# Patient Record
Sex: Female | Born: 1975 | Race: Black or African American | Hispanic: No | Marital: Married | Smoking: Never smoker
Health system: Southern US, Community
[De-identification: ages and names within clinical notes are randomized; demographics above are authoritative.]

## PROBLEM LIST (undated history)

## (undated) DIAGNOSIS — Z800 Family history of malignant neoplasm of digestive organs: Secondary | ICD-10-CM

## (undated) DIAGNOSIS — M2550 Pain in unspecified joint: Secondary | ICD-10-CM

## (undated) DIAGNOSIS — K760 Fatty (change of) liver, not elsewhere classified: Secondary | ICD-10-CM

## (undated) DIAGNOSIS — Z923 Personal history of irradiation: Secondary | ICD-10-CM

## (undated) DIAGNOSIS — E739 Lactose intolerance, unspecified: Secondary | ICD-10-CM

## (undated) DIAGNOSIS — R002 Palpitations: Secondary | ICD-10-CM

## (undated) DIAGNOSIS — E559 Vitamin D deficiency, unspecified: Secondary | ICD-10-CM

## (undated) DIAGNOSIS — K5900 Constipation, unspecified: Secondary | ICD-10-CM

## (undated) DIAGNOSIS — Z86718 Personal history of other venous thrombosis and embolism: Secondary | ICD-10-CM

## (undated) DIAGNOSIS — D649 Anemia, unspecified: Secondary | ICD-10-CM

## (undated) DIAGNOSIS — Z803 Family history of malignant neoplasm of breast: Secondary | ICD-10-CM

## (undated) DIAGNOSIS — F419 Anxiety disorder, unspecified: Secondary | ICD-10-CM

## (undated) DIAGNOSIS — I10 Essential (primary) hypertension: Secondary | ICD-10-CM

## (undated) DIAGNOSIS — M7989 Other specified soft tissue disorders: Secondary | ICD-10-CM

## (undated) HISTORY — DX: Other specified soft tissue disorders: M79.89

## (undated) HISTORY — DX: Vitamin D deficiency, unspecified: E55.9

## (undated) HISTORY — PX: REDUCTION MAMMAPLASTY: SUR839

## (undated) HISTORY — DX: Palpitations: R00.2

## (undated) HISTORY — DX: Essential (primary) hypertension: I10

## (undated) HISTORY — PX: ABLATION: SHX5711

## (undated) HISTORY — DX: Family history of malignant neoplasm of breast: Z80.3

## (undated) HISTORY — DX: Anxiety disorder, unspecified: F41.9

## (undated) HISTORY — DX: Fatty (change of) liver, not elsewhere classified: K76.0

## (undated) HISTORY — DX: Family history of malignant neoplasm of digestive organs: Z80.0

## (undated) HISTORY — DX: Personal history of other venous thrombosis and embolism: Z86.718

## (undated) HISTORY — DX: Pain in unspecified joint: M25.50

## (undated) HISTORY — DX: Lactose intolerance, unspecified: E73.9

## (undated) HISTORY — DX: Constipation, unspecified: K59.00

## (undated) HISTORY — DX: Anemia, unspecified: D64.9

---

## 1998-10-23 HISTORY — PX: BREAST LUMPECTOMY: SHX2

## 2021-03-11 ENCOUNTER — Other Ambulatory Visit: Payer: Self-pay | Admitting: Internal Medicine

## 2021-03-11 DIAGNOSIS — Z1231 Encounter for screening mammogram for malignant neoplasm of breast: Secondary | ICD-10-CM

## 2021-04-11 ENCOUNTER — Ambulatory Visit
Admission: RE | Admit: 2021-04-11 | Discharge: 2021-04-11 | Disposition: A | Discharge status: Home / Self Care | Payer: BC Managed Care – PPO | Source: Ambulatory Visit | Attending: Internal Medicine | Admitting: Internal Medicine

## 2021-04-11 DIAGNOSIS — Z1231 Encounter for screening mammogram for malignant neoplasm of breast: Secondary | ICD-10-CM

## 2021-04-16 ENCOUNTER — Other Ambulatory Visit: Payer: Self-pay | Admitting: Internal Medicine

## 2021-04-16 DIAGNOSIS — R928 Other abnormal and inconclusive findings on diagnostic imaging of breast: Secondary | ICD-10-CM

## 2021-04-27 ENCOUNTER — Other Ambulatory Visit: Payer: Self-pay

## 2021-04-27 ENCOUNTER — Ambulatory Visit
Admission: RE | Admit: 2021-04-27 | Discharge: 2021-04-27 | Disposition: A | Discharge status: Home / Self Care | Payer: BC Managed Care – PPO | Source: Ambulatory Visit | Attending: Internal Medicine | Admitting: Internal Medicine

## 2021-04-27 ENCOUNTER — Other Ambulatory Visit: Payer: Self-pay | Admitting: Internal Medicine

## 2021-04-27 DIAGNOSIS — R921 Mammographic calcification found on diagnostic imaging of breast: Secondary | ICD-10-CM

## 2021-04-27 DIAGNOSIS — R928 Other abnormal and inconclusive findings on diagnostic imaging of breast: Secondary | ICD-10-CM

## 2021-05-08 ENCOUNTER — Ambulatory Visit
Admission: RE | Admit: 2021-05-08 | Discharge: 2021-05-08 | Disposition: A | Discharge status: Home / Self Care | Payer: BC Managed Care – PPO | Source: Ambulatory Visit | Attending: Internal Medicine | Admitting: Internal Medicine

## 2021-05-08 ENCOUNTER — Other Ambulatory Visit: Payer: Self-pay | Admitting: Internal Medicine

## 2021-05-08 DIAGNOSIS — R921 Mammographic calcification found on diagnostic imaging of breast: Secondary | ICD-10-CM

## 2021-05-08 DIAGNOSIS — C50919 Malignant neoplasm of unspecified site of unspecified female breast: Secondary | ICD-10-CM

## 2021-05-08 HISTORY — PX: BREAST BIOPSY: SHX20

## 2021-05-08 HISTORY — DX: Malignant neoplasm of unspecified site of unspecified female breast: C50.919

## 2021-05-14 ENCOUNTER — Encounter: Payer: Self-pay | Admitting: General Surgery

## 2021-05-15 ENCOUNTER — Telehealth: Payer: Self-pay | Admitting: Genetic Counselor

## 2021-05-15 NOTE — Telephone Encounter (Signed)
Scheduled appt per 2/21 referral. Pt is aware of appt date and time. Pt is aware to arrive 15 mins prior to appt time and to bring and updated insurance card. Pt is aware of appt location.   ?

## 2021-05-16 ENCOUNTER — Ambulatory Visit: Payer: BC Managed Care – PPO | Attending: General Surgery | Admitting: Physical Therapy

## 2021-05-16 ENCOUNTER — Encounter: Payer: Self-pay | Admitting: Physical Therapy

## 2021-05-16 ENCOUNTER — Other Ambulatory Visit: Payer: Self-pay

## 2021-05-16 DIAGNOSIS — D0512 Intraductal carcinoma in situ of left breast: Secondary | ICD-10-CM | POA: Diagnosis not present

## 2021-05-16 DIAGNOSIS — R293 Abnormal posture: Secondary | ICD-10-CM | POA: Insufficient documentation

## 2021-05-16 NOTE — Therapy (Addendum)
OUTPATIENT PHYSICAL THERAPY BREAST CANCER BASELINE EVALUATION   Patient Name: Joan Dalton MRN: [id number] DOB:[birth year], 46 y.o., female Today's Date: 05/16/2021   PT End of Session - 05/16/21 1410     Visit Number 1    Number of Visits 2    Date for PT Re-Evaluation 06/27/21    PT Start Time 5701    PT Stop Time 1447    PT Time Calculation (min) 42 min    Activity Tolerance Patient tolerated treatment well    Behavior During Therapy WFL for tasks assessed/performed              PCP: Mckinley Jewel, MD  REFERRING PROVIDER: Jovita Kussmaul, MD  REFERRING DIAG: D05.12 (ICD-10-CM) - Intraductal carcinoma in situ of left breast   THERAPY DIAG:  Abnormal posture  Intraductal carcinoma in situ of left breast  ONSET DATE: 05/14/21  SUBJECTIVE                                                                                                                                                                                           SUBJECTIVE STATEMENT: Patient reports she is here today to be seen for her newly diagnosed left breast cancer.   PERTINENT HISTORY:  Patient was diagnosed on 05/14/21 with L breast cancer. It measures 2.8 cm and is located in the upper outer quadrant. 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 Previous hx of  lumpectomy in 2000 on the R side for a nodule (not cancerous), hx of blood clot in 2021  PATIENT GOALS   reduce lymphedema risk and learn post op HEP.   PAIN:  Are you having pain? Yes NPRS scale: 2/10 Pain location: L latissimus muscle Pain orientation: Left  PAIN TYPE: pulsating, burning Pain description: constant  Aggravating factors: worse at night Relieving factors: moving around  PRECAUTIONS: Active CA   HAND DOMINANCE: right  WEIGHT BEARING RESTRICTIONS No  FALLS:  Has patient fallen in last 6 months? No  LIVING ENVIRONMENT: Patient lives with: husband and 5 children (19.17.twins 84, 53) Lives in: House/apartment Has following  equipment at home: None  OCCUPATION: part time- grad Naval architect, full time Statistician in speech language pathology  LEISURE: pt reports she has stopped exercising recently  PRIOR LEVEL OF FUNCTION: Independent   OBJECTIVE  COGNITION:  Overall cognitive status: Within functional limits for tasks assessed    POSTURE:  Forward head and rounded shoulders posture  UPPER EXTREMITY AROM/PROM:  A/PROM RIGHT  05/16/2021   Shoulder extension 76  Shoulder flexion 153  Shoulder abduction 162  Shoulder internal rotation 63  Shoulder external rotation 75    (  Blank rows = not tested)  A/PROM LEFT  05/16/2021  Shoulder extension 66  Shoulder flexion 153  Shoulder abduction 156  Shoulder internal rotation 63  Shoulder external rotation 74    (Blank rows = not tested    UPPER EXTREMITY STRENGTH: 5/5   LYMPHEDEMA ASSESSMENTS:   LANDMARK RIGHT  05/16/2021  10 cm proximal to olecranon process 38.6  Olecranon process 31  10 cm proximal to ulnar styloid process 27  Just proximal to ulnar styloid process 19.7  Across hand at thumb web space 22  At base of 2nd digit 8  (Blank rows = not tested)  LANDMARK LEFT  05/16/2021  10 cm proximal to olecranon process 38  Olecranon process 31  10 cm proximal to ulnar styloid process 26.9  Just proximal to ulnar styloid process 20.7  Across hand at thumb web space 21.5  At base of 2nd digit 7.5  (Blank rows = not tested)   L-DEX LYMPHEDEMA SCREENING:  The patient was assessed using the L-Dex machine today to produce a lymphedema index baseline score. The patient will be reassessed on a regular basis (typically every 3 months) to obtain new L-Dex scores. If the score is > 6.5 points away from his/her baseline score indicating onset of subclinical lymphedema, it will be recommended to wear a compression garment for 4 weeks, 12 hours per day and then be reassessed. If the score continues to be > 6.5 points from baseline at  reassessment, we will initiate lymphedema treatment. Assessing in this manner has a 95% rate of preventing clinically significant lymphedema.   L-DEX FLOWSHEETS - 05/16/21 1400       L-DEX LYMPHEDEMA SCREENING   Measurement Type Unilateral    L-DEX MEASUREMENT EXTREMITY Upper Extremity   upper   POSITION  Standing    DOMINANT SIDE Right    At Risk Side Left    BASELINE SCORE (UNILATERAL) 3.6              PATIENT EDUCATION:  Education details: Lymphedema risk reduction and post op shoulder/posture HEP Person educated: Patient Education method: Explanation, Demonstration, Handout Education comprehension: Patient verbalized understanding and returned demonstration   HOME EXERCISE PROGRAM: Patient was instructed today in a home exercise program today for post op shoulder range of motion. These included active assist shoulder flexion in sitting, scapular retraction, wall walking with shoulder abduction, and hands behind head external rotation.  She was encouraged to do these twice a day, holding 3 seconds and repeating 5 times when permitted by her physician.   ASSESSMENT:  CLINICAL IMPRESSION: Pt reports to PT with recently diagnosed L breast cancer. She will be undergoing a left lumpectomy, SLNB and radiation pending testing. Her shoulder ROM is WFL. She does have a 3-4 month history of L posterior shoulder pain that is worse at night. Her doctor was going to follow up on this to see if it is related to her cancer diagnosis. She may benefit from therapy for this when she returns post surgery. She will benefit from a post op PT reassessment to determine needs and from L-Dex screens every 3 months for 2 years to detect subclinical lymphedema.  Pt will benefit from skilled therapeutic intervention to improve on the following deficits: Decreased knowledge of precautions, impaired UE functional use, pain, decreased ROM, postural dysfunction.   PT treatment/interventions: ADL/self-care  home management, pt/family education, therapeutic exercise  REHAB POTENTIAL: Good  CLINICAL DECISION MAKING: Stable/uncomplicated  EVALUATION COMPLEXITY: Low   GOALS: Goals reviewed with  patient? YES  LONG TERM GOALS: (STG=LTG)   Name Target Date Goal status  1 Pt will be able to verbalize understanding of pertinent lymphedema risk reduction practices relevant to her dx specifically related to skin care.  Baseline:  No knowledge 05/16/2021 Achieved at eval  2 Pt will be able to return demo and/or verbalize understanding of the post op HEP related to regaining shoulder ROM. Baseline:  No knowledge 05/16/2021 Achieved at eval  3 Pt will be able to verbalize understanding of the importance of attending the post op After Breast CA Class for further lymphedema risk reduction education and therapeutic exercise.  Baseline:  No knowledge 05/16/2021 Achieved at eval  4 Pt will demo she has regained full shoulder ROM and function post operatively compared to baselines.  Baseline: See objective measurements taken today. 06/27/21 Inital     PLAN: PT FREQUENCY/DURATION: EVAL and 1 follow up appointment.   PLAN FOR NEXT SESSION: will reassess 3-4 weeks post op to determine needs.   Patient will follow up at outpatient cancer rehab 3-4 weeks following surgery.  If the patient requires physical therapy at that time, a specific plan will be dictated and sent to the referring physician for approval. The patient was educated today on appropriate basic range of motion exercises to begin post operatively and the importance of attending the After Breast Cancer class following surgery.  Patient was educated today on lymphedema risk reduction practices as it pertains to recommendations that will benefit the patient immediately following surgery.  She verbalized good understanding.    Physical Therapy Information for After Breast Cancer Surgery/Treatment:  Lymphedema is a swelling condition that you may be at  risk for in your arm if you have lymph nodes removed from the armpit area.  After a sentinel node biopsy, the risk is approximately 5-9% and is higher after an axillary node dissection.  There is treatment available for this condition and it is not life-threatening.  Contact your physician or physical therapist with concerns. You may begin the 4 shoulder/posture exercises (see additional sheet) when permitted by your physician (typically a week after surgery).  If you have drains, you may need to wait until those are removed before beginning range of motion exercises.  A general recommendation is to not lift your arms above shoulder height until drains are removed.  These exercises should be done to your tolerance and gently.  This is not a "no pain/no gain" type of recovery so listen to your body and stretch into the range of motion that you can tolerate, stopping if you have pain.  If you are having immediate reconstruction, ask your plastic surgeon about doing exercises as he or she may want you to wait. We encourage you to attend the free one time ABC (After Breast Cancer) class offered by [facility].  You will learn information related to lymphedema risk, prevention and treatment and additional exercises to regain mobility following surgery.  You can call [phone] for more information.  This is offered the 1st and 3rd Monday of each month.  You only attend the class one time. While undergoing any medical procedure or treatment, try to avoid blood pressure being taken or needle sticks from occurring on the arm on the side of cancer.   This recommendation begins after surgery and continues for the rest of your life.  This may help reduce your risk of getting lymphedema (swelling in your arm). An excellent resource for those seeking information on  lymphedema is the National Lymphedema Network's web site. It can be accessed at [place].org If you notice swelling in your hand,  arm or breast at any time following surgery (even if it is many years from now), please contact your doctor or physical therapist to discuss this.  Lymphedema can be treated at any time but it is easier for you if it is treated early on.  If you feel like your shoulder motion is not returning to normal in a reasonable amount of time, please contact your surgeon or physical therapist.  [facility] [phone]. [address], [place] [address]  ABC CLASS After Breast Cancer Class  After Breast Cancer Class is a specially designed exercise class to assist you in a safe recover after having breast cancer surgery.  In this class you will learn how to get back to full function whether your drains were just removed or if you had surgery a month ago.  This one-time class is held the 1st and 3rd Monday of every month from 11:00 a.m. until 12:00 noon virtually.  This class is FREE and space is limited. For more information or to register for the next available class, call [phone].  Class Goals  Understand specific stretches to improve the flexibility of you chest and shoulder. Learn ways to safely strengthen your upper body and improve your posture. Understand the warning signs of infection and why you may be at risk for an arm infection. Learn about Lymphedema and prevention.  ** You do not attend this class until after surgery.  Drains must be removed to participate  Patient was instructed today in a home exercise program today for post op shoulder range of motion. These included active assist shoulder flexion in sitting, scapular retraction, wall walking with shoulder abduction, and hands behind head external rotation.  She was encouraged to do these twice a day, holding 3 seconds and repeating 5 times when permitted by her physician.    Allyson Sabal [place], PT 05/16/2021, 2:53 PM   PHYSICAL THERAPY DISCHARGE SUMMARY  Visits from Start  of Care: 1  Current functional level related to goals / functional outcomes: See above   Remaining deficits: See above - pt did not return for post op follow up   Education / Equipment: See above   Patient agrees to discharge. Patient goals were not met. Patient is being discharged due to not returning since the last visit.  Allyson Sabal [place], [place] 02/18/22 9:55 AM

## 2021-05-17 ENCOUNTER — Telehealth: Payer: Self-pay | Admitting: Hematology and Oncology

## 2021-05-17 NOTE — Telephone Encounter (Signed)
Scheduled appt per 2/23 staff msg from nurse navigator. Pt is aware of appt date and time. Pt is aware to arrive 15 mins prior to appt time and to bring and updated insurance card. Pt is aware of appt location.

## 2021-05-20 DIAGNOSIS — D0512 Intraductal carcinoma in situ of left breast: Secondary | ICD-10-CM | POA: Insufficient documentation

## 2021-05-20 NOTE — Progress Notes (Signed)
Radiation Oncology         [phone] ________________________________  Name: Joan Dalton        MRN: [id number]  Date of Service: 05/21/2021 DOB: 06-12-1975  AO:[id number], [name], MD  Jovita Kussmaul, MD     REFERRING PHYSICIAN: Jovita Kussmaul, MD   DIAGNOSIS: The encounter diagnosis was Ductal carcinoma in situ (DCIS) of left breast.   HISTORY OF PRESENT ILLNESS: Joan Dalton is a 46 y.o. female seen at the request of Dr. Marlou Dalton for a newly diagnosed left DCIS.  The patient originally presented with screening detected calcifications in the left breast.  She has a family history of breast cancer.  Diagnostic imaging by mammogram showed a 2.8 cm span of calcifications in the upper outer quadrant.  She underwent stereotactic biopsy on 05/08/2021.  This revealed high-grade DCIS with focal microinvasion that could not be ruled out, associated fibrocystic change with PASH. Her cancer was ER/PR positive. She is seen to discuss treatment recommendations of her cancer.     PREVIOUS RADIATION THERAPY: No   PAST MEDICAL HISTORY:  Past Medical History:  Diagnosis Date   Breast cancer ([place]) 05/08/2021       PAST SURGICAL HISTORY: Past Surgical History:  Procedure Laterality Date   BREAST BIOPSY Left 05/08/2021     FAMILY HISTORY:  Family History  Problem Relation Age of Onset   Breast cancer Mother        14s & 25s   Prostate cancer Father    Colon cancer Maternal Aunt        6s   Breast cancer Maternal Aunt        63s   Pancreatic cancer Maternal Aunt        70s   Colon cancer Maternal Uncle        50s     SOCIAL HISTORY:  reports that she has never smoked. She has never used smokeless tobacco. She reports that she does not drink alcohol and does not use drugs. The patient is married and lives in [place] and is accompanied by her husband. They relocated from [place] recently due to her husband's job as an Psychologist, sport and exercise at [employer]. 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 They have a middle school aged son and high school aged daughter.    ALLERGIES: Sulfamethoxazole-trimethoprim   MEDICATIONS:  Current Outpatient Medications  Medication Sig Dispense Refill   Cholecalciferol 25 MCG (1000 UT) tablet Take by mouth.     ferrous sulfate 325 (65 FE) MG tablet Take by mouth.     Multiple Vitamin (MULTI-VITAMIN) tablet Take 1 tablet by mouth daily.     No current facility-administered medications for this encounter.     REVIEW OF SYSTEMS: On review of systems, the patient reports that she is doing pretty well overall. The process has been somewhat anxiety provoking due to her mother's history of metastatic breast cancer. No breast specific complaints are noted otherwise.      PHYSICAL EXAM:  Wt Readings from Last 3 Encounters:  05/21/21 268 lb (121.6 kg)   Temp Readings from Last 3 Encounters:  05/21/21 97.8 F (36.6 C)   BP Readings from Last 3 Encounters:  05/21/21 (!) 137/91   Pulse Readings from Last 3 Encounters:  05/21/21 88    In general this is a well appearing  African American female in no acute distress. She's alert and oriented x4 and appropriate throughout the examination.  Cardiopulmonary assessment is negative for acute distress and she exhibits normal effort. Bilateral breast exam is deferred.    ECOG = 1  0 - Asymptomatic (Fully active, able to carry on all predisease activities without restriction)  1 - Symptomatic but completely ambulatory (Restricted in physically strenuous activity but ambulatory and able to carry out work of a light or sedentary nature. For example, light housework, office work)  2 - Symptomatic, <50% in bed during the day (Ambulatory and capable of all self care but unable to carry out any work activities. Up and about more than 50% of waking hours)  3 - Symptomatic, >50% in bed, but not bedbound (Capable of only limited self-care, confined to bed or  chair 50% or more of waking hours)  4 - Bedbound (Completely disabled. Cannot carry on any self-care. Totally confined to bed or chair)  5 - Death   Eustace Pen MM, Creech RH, Tormey DC, et al. [phone]). "Toxicity and response criteria of the [facility]". [place] Oncol. 5 (6): 649-55    LABORATORY DATA:  No results found for: WBC, HGB, HCT, MCV, PLT No results found for: NA, K, CL, CO2 No results found for: ALT, AST, GGT, ALKPHOS, BILITOT    RADIOGRAPHY: MM Digital Diagnostic Unilat L  Result Date: 04/27/2021 CLINICAL DATA:  Screening recall for left breast calcifications. The patient has family history of breast cancer in her mother who is currently being treated for metastatic disease. She has family history in a maternal aunt. EXAM: DIGITAL DIAGNOSTIC UNILATERAL LEFT MAMMOGRAM WITH CAD TECHNIQUE: Left digital diagnostic mammography was performed. Mammographic images were processed with CAD. COMPARISON:  Previous exam(s). ACR Breast Density Category c: The breast tissue is heterogeneously dense, which may obscure small masses. FINDINGS: In the upper outer posterior left breast there is a 2.8 cm group of punctate and amorphous calcifications. IMPRESSION: New 2.8 cm group of calcifications in the upper-outer left breast. RECOMMENDATION: Stereotactic biopsy is recommended for the left breast calcifications. The procedure has been scheduled for 05/08/2021 at 7:30 a.m. I have discussed the findings and recommendations with the patient. If applicable, a reminder letter will be sent to the patient regarding the next appointment. BI-RADS CATEGORY  4: Suspicious. Electronically Signed   By: Ammie Ferrier M.D.   On: 04/27/2021 08:06  MM CLIP PLACEMENT LEFT  Result Date: 05/08/2021 CLINICAL DATA:  Post stereotactic guided biopsy of calcifications in the upper-outer posterior left breast. EXAM: 3D DIAGNOSTIC LEFT MAMMOGRAM POST STEREOTACTIC BIOPSY COMPARISON:  Previous exam(s).  FINDINGS: 3D Mammographic images were obtained following stereotactic guided biopsy of calcifications in the upper-outer posterior left breast. A coil shaped biopsy marking clip is present at the site of the biopsied calcifications in the upper-outer posterior left breast. IMPRESSION: Coil shaped biopsy marking clip at site of biopsied calcifications in the upper-outer posterior left breast. Final Assessment: Post Procedure Mammograms for Marker Placement Electronically Signed   By: Everlean Alstrom M.D.   On: 05/08/2021 08:20  MM LT BREAST BX W LOC DEV 1ST LESION IMAGE BX SPEC STEREO GUIDE  Result Date: 05/08/2021 CLINICAL DATA:  46 year old female with indeterminate 2.8 cm group of calcifications in the upper-outer posterior left breast. EXAM: LEFT BREAST STEREOTACTIC CORE NEEDLE BIOPSY COMPARISON:  Previous exams. FINDINGS: The patient and I discussed the procedure of stereotactic-guided biopsy including benefits and alternatives. We discussed the high likelihood of a successful procedure. We discussed the risks of the procedure including infection, bleeding, tissue injury, clip migration, and inadequate  sampling. Informed written consent was given. The usual time out protocol was performed immediately prior to the procedure. Using sterile technique and 1% Lidocaine as local anesthetic, under stereotactic guidance, a 9 gauge vacuum assisted device was used to perform core needle biopsy of the calcifications in the upper-outer posterior left breast using a superior to inferior approach. Specimen radiograph was performed showing the presence of calcifications. Specimens with calcifications are identified for pathology. Lesion quadrant: Upper outer At the conclusion of the procedure, a coil shaped tissue marker clip was deployed into the biopsy cavity. Follow-up 2-view mammogram was performed and dictated separately. IMPRESSION: Stereotactic-guided biopsy of the calcifications in the upper-outer posterior left  breast. No apparent complications. Electronically Signed   By: Everlean Alstrom M.D.   On: 05/08/2021 08:12      IMPRESSION/PLAN: 1. ER/PR positive high-grade DCIS of the left breast cannot rule out focal microinvasion. Dr. Lisbeth Renshaw discusses the pathology findings and reviews the nature of left breast disease. The consensus from the breast conference includes  breast conservation with lumpectomy with sentinel node biopsy versus mastectomy. The patient is considering all of her options. Dr. Lisbeth Renshaw discusses the rationale for external radiotherapy to the breast  if she undergoes breast conserving surgery to reduce risks of local recurrence followed by antiestrogen therapy. We discussed the risks, benefits, short, and long term effects of radiotherapy, as well as the curative intent, and the patient is interested in proceeding. Dr. Lisbeth Renshaw discusses the delivery and logistics of radiotherapy and anticipates a course of 6 1/2 weeks of radiotherapy to the left breast with DIBH technique. We will see her back a few weeks after surgery (if undergoing breast conservation) to discuss the simulation process and anticipate we starting radiotherapy about 4-6 weeks after surgery.  2. Possible genetic predisposition to malignancy. The patient is meeting with our genetic counseling team on 06/06/21. 3. Contraceptive Counseling. The patient does not have uterine bleeding following her endometrial ablation. She may have blood work with Dr. Chryl Heck to see if she is menopausal or not, but if not, may need pregnancy testing prior to radiotherapy.   In a visit lasting 60 minutes, greater than 50% of the time was spent face to face reviewing her case, as well as in preparation of, discussing, and coordinating the patient's care.  The above documentation reflects my direct findings during this shared patient visit. Please see the separate note by Dr. Lisbeth Renshaw on this date for the remainder of the patient's plan of care.    Carola Rhine, [facility]    **Disclaimer: This note was dictated with voice recognition software. Similar sounding words can inadvertently be transcribed and this note may contain transcription errors which may not have been corrected upon publication of note.**

## 2021-05-20 NOTE — Progress Notes (Signed)
New Breast Cancer Diagnosis: Left Breast  Did patient present with symptoms (if so, please note symptoms) or screening mammography?:Screening Calcifications measuring 2.8 cm in the upper outer quadrant of the left breast.   Location and Extent of disease :left breast. Located in the upper outer quadrant, measured 0.2 cm in greatest dimension. Adenopathy no.  Histology per Pathology Report: grade , DCIS  Receptor Status: ER(positive), PR (positive), Her2-neu (), Ki-(%)    Plastic Surgery plan, if any: Dr. Marla Roe 05/21/2021   Surgeon and surgical plan, if any:  Dr. Marlou Starks 05/14/2021 -I have discussed with her in detail the different options for treatment and at this point she is undecided between lumpectomy and mastectomy. -I will refer her to medical and radiation oncology to discuss treatment again. -I will also refer her to genetics given her family history as well as to plastic surgery in case she chooses mastectomy. -She will let us know once she has made a definitive choice.  -I will also refer her to physical therapy for preoperative lymphedema testing and to evaluate her rhomboid pain.  -She has decided to do Lumpectomy.  Surgery unscheduled at this time.    Medical oncologist, treatment if any:    Family History of Breast/Ovarian/Prostate Cancer: Mother and Maternal Aunt had breast cancer.  Dad had prostate Cancer.   Lymphedema issues, if any: Notes some slight swelling under her arm pit.     Pain issues, if any:  No   SAFETY ISSUES: Prior radiation? No Pacemaker/ICD? No Possible current pregnancy? Ablation, No cycle. Is the patient on methotrexate? No  Current Complaints / other details:

## 2021-05-21 ENCOUNTER — Ambulatory Visit
Admission: RE | Admit: 2021-05-21 | Discharge: 2021-05-21 | Disposition: A | Discharge status: Home / Self Care | Payer: BC Managed Care – PPO | Source: Ambulatory Visit | Attending: Radiation Oncology | Admitting: Radiation Oncology

## 2021-05-21 ENCOUNTER — Encounter: Payer: Self-pay | Admitting: Radiation Oncology

## 2021-05-21 ENCOUNTER — Encounter: Payer: Self-pay | Admitting: Plastic Surgery

## 2021-05-21 ENCOUNTER — Other Ambulatory Visit: Payer: Self-pay

## 2021-05-21 ENCOUNTER — Ambulatory Visit: Payer: BC Managed Care – PPO | Admitting: Plastic Surgery

## 2021-05-21 ENCOUNTER — Telehealth: Payer: Self-pay | Admitting: Genetic Counselor

## 2021-05-21 VITALS — BP 134/89 | HR 73 | Ht 66.0 in | Wt 268.0 lb

## 2021-05-21 DIAGNOSIS — D0512 Intraductal carcinoma in situ of left breast: Secondary | ICD-10-CM

## 2021-05-21 DIAGNOSIS — Z803 Family history of malignant neoplasm of breast: Secondary | ICD-10-CM | POA: Diagnosis not present

## 2021-05-21 DIAGNOSIS — Z170 Estrogen receptor positive status [ER+]: Secondary | ICD-10-CM | POA: Insufficient documentation

## 2021-05-21 DIAGNOSIS — Z800 Family history of malignant neoplasm of digestive organs: Secondary | ICD-10-CM | POA: Diagnosis not present

## 2021-05-21 NOTE — Progress Notes (Signed)
Patient ID: Joan Dalton, female    DOB: Apr 01, 1975, 46 y.o.   MRN: [id number]   Chief Complaint  Patient presents with   Advice Only   Breast Cancer    The patient is a 46 year old female here with her husband for a consultation for breast reconstruction.  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  She is 5 feet 6 inches tall and weighs 268 pounds.  Her preop bra size is between a C and a D.  She does have a strong family history of breast cancer.  If she goes with a partial mastectomy she is prepared to get radiation.  If her genetics comes back positive then she is planning on going with bilateral mastectomies.  She has a history of a pulmonary embolism and morbid obesity.  She is currently studying to get her degree at [employer].  She has mild ptosis which would be improved with a mastopexy.   Review of Systems  Constitutional:  Negative for activity change and appetite change.  Eyes: Negative.   Respiratory: Negative.  Negative for chest tightness and shortness of breath.   Cardiovascular: Negative.  Negative for leg swelling.  Gastrointestinal: Negative.   Endocrine: Negative.   Genitourinary: Negative.   Allergic/Immunologic: Negative.   Neurological: Negative.   Hematological: Negative.    Past Medical History:  Diagnosis Date   Breast cancer ([place]) 05/08/2021    Past Surgical History:  Procedure Laterality Date   BREAST BIOPSY Left 05/08/2021      Current Outpatient Medications:    Cholecalciferol 25 MCG (1000 UT) tablet, Take by mouth., Disp: , Rfl:    ferrous sulfate 325 (65 FE) MG tablet, Take by mouth., Disp: , Rfl:    Multiple Vitamin (MULTI-VITAMIN) tablet, Take 1 tablet by mouth daily., Disp: , Rfl:    Objective:   Vitals:   05/21/21 1047  BP: 134/89  Pulse: 73  SpO2: 97%    Physical Exam Vitals reviewed.  Constitutional:      Appearance: Normal appearance.  HENT:     Head: Normocephalic.   Cardiovascular:     Rate and Rhythm: Normal rate.     Pulses: Normal pulses.  Pulmonary:     Effort: Pulmonary effort is normal. No respiratory distress.  Abdominal:     General: There is no distension.     Palpations: Abdomen is soft.     Tenderness: There is no abdominal tenderness.  Skin:    General: Skin is warm.     Capillary Refill: Capillary refill takes less than 2 seconds.     Coloration: Skin is not jaundiced.     Findings: No bruising or lesion.  Neurological:     Mental Status: She is alert and oriented to person, place, and time.  Psychiatric:        Mood and Affect: Mood normal.        Behavior: Behavior normal.        Thought Content: Thought content normal.        Judgment: Judgment normal.    Assessment & Plan:  Ductal carcinoma in situ (DCIS) of left breast  The options for reconstruction we explained to the patient / family for breast reconstruction.  There are two general categories of reconstruction.  We can reconstruction a breast with implants or use the patient's own tissue.  These were further discussed as listed.  Breast reconstruction is an optional procedure and eligibility depends  on the full spectrum of the health of the patient and any co-morbidities.  More than one surgery is often needed to complete the reconstruction process.  The process can take three to twelve months to complete.  The breasts will not be identical due to many factors such as rib differences, shoulder asymmetry and treatments such as radiation.  The goal is to get the breasts to look normal and symmetrical in clothes.  Scars are a part of surgery and may fade some in time but will always be present under clothes.  Surgery may be an option on the non-cancer breast to achieve more symmetry.  No matter which procedure is chosen there is always the risk of complications and even failure of the body to heal.  This could result in no breast.    The options for reconstruction include:  1.  Placement of a tissue expander with Acellular dermal matrix. When the expander is the desired size surgery is performed to remove the expander and place an implant.  In some cases the implant can be placed without an expander.  2. Autologous reconstruction.  3. Combined procedures (ie. latissismus dorsi flap) can be done with an expander / implant placed under the muscle.   The risks, benefits, scars and recovery time were discussed for each of the above. Risks include bleeding, infection, hematoma, seroma, scarring, pain, wound healing complications, flap loss, fat necrosis, capsular contracture, need for implant removal, donor site complications, bulge, hernia, umbilical necrosis, need for urgent reoperation, and need for dressing changes.   The procedure the patient selected / that was best for the patient, was then discussed in further detail.  Total time: 45 minutes. This includes time spent with the patient during the visit as well as time spent before and after the visit reviewing the chart, documenting the encounter, making phone calls and reviewing studies.   Once the patient gets her genetics back we can talk about reconstruction if it is positive.  We reviewed the options as listed above.  If genetics is negative she would like to go ahead with bilateral mastopexy at the time of the partial mastectomy.  I think that is a good idea and will give her a better result.  I have placed a call to Dr. Marlou Starks to discuss.  I will also plan to talk with her in 2 weeks once she gets the results of the genetics back.  Pictures were obtained of the patient and placed in the chart with the patient's or guardian's permission.    [place], DO

## 2021-05-21 NOTE — Telephone Encounter (Signed)
Received call from patient about moving up genetics appt.  Rescheduled genetics appt for 3/2 at [facility] with Santiago Glad.

## 2021-05-22 ENCOUNTER — Other Ambulatory Visit: Payer: Self-pay | Admitting: Genetic Counselor

## 2021-05-22 ENCOUNTER — Encounter: Payer: Self-pay | Admitting: Genetic Counselor

## 2021-05-22 DIAGNOSIS — D0512 Intraductal carcinoma in situ of left breast: Secondary | ICD-10-CM

## 2021-05-23 ENCOUNTER — Ambulatory Visit: Payer: Self-pay | Admitting: General Surgery

## 2021-05-23 ENCOUNTER — Other Ambulatory Visit: Payer: Self-pay

## 2021-05-23 ENCOUNTER — Inpatient Hospital Stay: Payer: BC Managed Care – PPO | Attending: Genetic Counselor | Admitting: Genetic Counselor

## 2021-05-23 ENCOUNTER — Encounter: Payer: BC Managed Care – PPO | Admitting: Genetic Counselor

## 2021-05-23 ENCOUNTER — Other Ambulatory Visit: Payer: BC Managed Care – PPO

## 2021-05-23 ENCOUNTER — Inpatient Hospital Stay: Payer: BC Managed Care – PPO

## 2021-05-23 DIAGNOSIS — D0512 Intraductal carcinoma in situ of left breast: Secondary | ICD-10-CM

## 2021-05-23 DIAGNOSIS — Z8042 Family history of malignant neoplasm of prostate: Secondary | ICD-10-CM | POA: Insufficient documentation

## 2021-05-23 DIAGNOSIS — Z803 Family history of malignant neoplasm of breast: Secondary | ICD-10-CM | POA: Insufficient documentation

## 2021-05-23 DIAGNOSIS — Z800 Family history of malignant neoplasm of digestive organs: Secondary | ICD-10-CM | POA: Insufficient documentation

## 2021-05-24 ENCOUNTER — Encounter: Payer: Self-pay | Admitting: Genetic Counselor

## 2021-05-24 ENCOUNTER — Other Ambulatory Visit: Payer: Self-pay | Admitting: General Surgery

## 2021-05-24 DIAGNOSIS — Z803 Family history of malignant neoplasm of breast: Secondary | ICD-10-CM | POA: Insufficient documentation

## 2021-05-24 DIAGNOSIS — D0512 Intraductal carcinoma in situ of left breast: Secondary | ICD-10-CM

## 2021-05-24 DIAGNOSIS — Z800 Family history of malignant neoplasm of digestive organs: Secondary | ICD-10-CM | POA: Insufficient documentation

## 2021-05-24 NOTE — Progress Notes (Signed)
REFERRING PROVIDER: Jovita Kussmaul, MD [address] [place],  [place] [address]  PRIMARY PROVIDER:  Mckinley Jewel, MD  PRIMARY REASON FOR VISIT:  1. Family history of breast cancer   2. Family history of colon cancer   3. Family history of pancreatic cancer   4. Ductal carcinoma in situ (DCIS) of left breast      HISTORY OF PRESENT ILLNESS:   Joan Dalton, a 46 y.o. female, was seen for a [facility] cancer genetics consultation at the request of Dr. Marlou Starks due to a personal and family history of breast cancer.  Joan Dalton presents to clinic today to discuss the possibility of a hereditary predisposition to cancer, genetic testing, and to further clarify her future cancer risks, as well as potential cancer risks for family members.   In February 2023, at the age of 80, Joan Dalton was diagnosed with DCIS breast cancer. The treatment plan lumpectomy and radiation.      CANCER HISTORY:  Oncology History   No history exists.     RISK FACTORS:  Menarche was at age 64.  First live birth at age 47.  Ovaries intact: yes.  Hysterectomy: no.  Menopausal status: perimenopausal.  HRT use: 0 years. Colonoscopy: yes; normal. Mammogram within the last year: yes. Number of breast biopsies: 1. Up to date with pelvic exams: yes. Any excessive radiation exposure in the past: no  Past Medical History:  Diagnosis Date   Breast cancer ([place]) 05/08/2021   Family history of breast cancer    Family history of colon cancer    Family history of pancreatic cancer     Past Surgical History:  Procedure Laterality Date   BREAST BIOPSY Left 05/08/2021    Social History   Socioeconomic History   Marital status: Married    Spouse name: Not on file   Number of children: Not on file   Years of education: Not on file   Highest education level: Not on file  Occupational History   Not on file  Tobacco Use   Smoking status: Never   Smokeless tobacco: Never  Substance and Sexual Activity    Alcohol use: Never   Drug use: Never   Sexual activity: Not on file  Other Topics Concern   Not on file  Social History Narrative   Not on file   Social Determinants of Health   Financial Resource Strain: Not on file  Food Insecurity: Not on file  Transportation Needs: Not on file  Physical Activity: Not on file  Stress: Not on file  Social Connections: Not on file     FAMILY HISTORY:  We obtained a detailed, 4-generation family history.  Significant diagnoses are listed below: Family History  Problem Relation Age of Onset   Breast cancer Mother        42s & 40s   Prostate cancer Father    Breast cancer Maternal Aunt 75   Pancreatic cancer Maternal Aunt 75   Breast cancer Maternal Aunt        later 30s-early 51s   Colon cancer Maternal Aunt 42   Colon cancer Maternal Uncle        60s   Colon cancer Maternal Grandfather    Pancreatic cancer Cousin        mother's maternal first cousin   Pancreatic cancer Cousin        mother's maternal first cousin     The patient has five children who are all cancer  free.  She has three sisters and a brother who are all cancer free. She also has a paternal half sister who's health she is unaware of.  Her parents are living.  The patient's father was diagnosed with prostate cancer. He was adopted but they have recently become aware of his biological family.  He has two sisters and a brother - none had cancer.  His biological parents are deceased.  The patient's mother had breast cancer at 5 and again in her 64's. She has six sisters and a brother.  The brother died of lung cancer, one sister had breast and pancreatic cancer at 69, a second sister had breast cancer in her 35's-40's, and a third sister had colon cancer at 21.  One sister, who did not have cancer, had a daughter who had cancer in the muscle/tissue of her leg.  The maternal grandfather had colon cancer and the grandmother died of COPD.  She has two nieces with pancreatic  cancer.  Joan Dalton is unaware of previous family history of genetic testing for hereditary cancer risks. Patient's maternal ancestors are of [place] American descent, and paternal ancestors are of [place] and [place] descent. There is no reported Ashkenazi Jewish ancestry. There is no known consanguinity.  GENETIC COUNSELING ASSESSMENT: Joan Dalton is a 46 y.o. female with a personal and family history of breast cancer which is somewhat suggestive of a hereditary cancer syndrome and predisposition to cancer given the young ages of onset and combination of cancers. We, therefore, discussed and recommended the following at today's visit.   DISCUSSION: We discussed that, in general, most cancer is not inherited in families, but instead is sporadic or familial. Sporadic cancers occur by chance and typically happen at older ages (>50 years) as this type of cancer is caused by genetic changes acquired during an individuals lifetime. Some families have more cancers than would be expected by chance; however, the ages or types of cancer are not consistent with a known genetic mutation or known genetic mutations have been ruled out. This type of familial cancer is thought to be due to a combination of multiple genetic, environmental, hormonal, and lifestyle factors. While this combination of factors likely increases the risk of cancer, the exact source of this risk is not currently identifiable or testable.  We discussed that 5 - 10% of breast cancer is hereditary, with most cases associated with BRCA mutations.  There are other genes that can be associated with hereditary breast cancer syndromes.  These include ATM, CHEK2 and PALB2.  We discussed that testing is beneficial for several reasons including knowing how to follow individuals after completing their treatment, identifying whether potential treatment options such as PARP inhibitors would be beneficial, and understand if other family members could be at  risk for cancer and allow them to undergo genetic testing.   We reviewed the characteristics, features and inheritance patterns of hereditary cancer syndromes. We also discussed genetic testing, including the appropriate family members to test, the process of testing, insurance coverage and turn-around-time for results. We discussed the implications of a negative, positive and/or variant of uncertain significant result. In order to get genetic test results in a timely manner so that Joan Dalton can use these genetic test results for surgical decisions, we recommended Joan Dalton pursue genetic testing for the BRCAPlus. Once complete, we recommend Joan Dalton pursue reflex genetic testing to the CancerNext-Expanded+RNAinsight gene panel.   The CancerNext-Expanded gene panel offered by [facility] and includes sequencing and  rearrangement analysis for the following 77 genes: AIP, ALK, APC*, ATM*, AXIN2, BAP1, BARD1, BLM, BMPR1A, BRCA1*, BRCA2*, BRIP1*, CDC73, CDH1*, CDK4, CDKN1B, CDKN2A, CHEK2*, CTNNA1, DICER1, FANCC, FH, FLCN, GALNT12, KIF1B, LZTR1, MAX, MEN1, MET, MLH1*, MSH2*, MSH3, MSH6*, MUTYH*, NBN, NF1*, NF2, NTHL1, PALB2*, PHOX2B, PMS2*, POT1, PRKAR1A, PTCH1, PTEN*, RAD51C*, RAD51D*, RB1, RECQL, RET, SDHA, SDHAF2, SDHB, SDHC, SDHD, SMAD4, SMARCA4, SMARCB1, SMARCE1, STK11, SUFU, TMEM127, TP53*, TSC1, TSC2, VHL and XRCC2 (sequencing and deletion/duplication); EGFR, EGLN1, HOXB13, KIT, MITF, PDGFRA, POLD1, and POLE (sequencing only); EPCAM and GREM1 (deletion/duplication only). DNA and RNA analyses performed for * genes.   Based on Joan Dalton's personal and family history of cancer, she meets medical criteria for genetic testing. Despite that she meets criteria, she may still have an out of pocket cost. We discussed that if her out of pocket cost for testing is over $100, the laboratory will call and confirm whether she wants to proceed with testing.  If the out of pocket cost of testing is less than $100 she  will be billed by the genetic testing laboratory.   PLAN: After considering the risks, benefits, and limitations, Joan Dalton provided informed consent to pursue genetic testing and the blood sample was sent to [facility] for analysis of the CancerNext-Expanded+RNAinsight panel. Results should be available within approximately 2-3 weeks' time, at which point they will be disclosed by telephone to Joan Dalton, as will any additional recommendations warranted by these results. Joan Dalton will receive a summary of her genetic counseling visit and a copy of her results once available. This information will also be available in Epic.   Lastly, we encouraged Joan Dalton to remain in contact with cancer genetics annually so that we can continuously update the family history and inform her of any changes in cancer genetics and testing that may be of benefit for this family.   Joan Dalton questions were answered to her satisfaction today. Our contact information was provided should additional questions or concerns arise. Thank you for the referral and allowing [place] to share in the care of your patient.   Joan Dalton P. Florene Glen, [place], [facility] Licensed, Insurance risk surveyor [email or web address] phone: [phone]  The patient was seen for a total of 30 minutes in face-to-face genetic counseling.  The patient brought her husband. This patient was discussed with Drs. Magrinat, Lindi Adie and/or Burr Medico who agrees with the above.    _______________________________________________________________________ For Office Staff:  Number of people involved in session: 2 Was an Intern/ student involved with case: no

## 2021-05-27 ENCOUNTER — Other Ambulatory Visit: Payer: Self-pay | Admitting: *Deleted

## 2021-05-27 DIAGNOSIS — D0512 Intraductal carcinoma in situ of left breast: Secondary | ICD-10-CM

## 2021-05-30 ENCOUNTER — Ambulatory Visit: Payer: BC Managed Care – PPO | Admitting: Rehabilitation

## 2021-06-06 ENCOUNTER — Encounter: Payer: Self-pay | Admitting: Genetic Counselor

## 2021-06-06 ENCOUNTER — Other Ambulatory Visit: Payer: Self-pay

## 2021-06-06 ENCOUNTER — Inpatient Hospital Stay: Payer: BC Managed Care – PPO | Admitting: Genetic Counselor

## 2021-06-06 ENCOUNTER — Encounter: Payer: Self-pay | Admitting: *Deleted

## 2021-06-06 ENCOUNTER — Inpatient Hospital Stay: Payer: BC Managed Care – PPO

## 2021-06-06 ENCOUNTER — Ambulatory Visit: Payer: Self-pay | Admitting: Genetic Counselor

## 2021-06-06 ENCOUNTER — Inpatient Hospital Stay (HOSPITAL_BASED_OUTPATIENT_CLINIC_OR_DEPARTMENT_OTHER): Payer: BC Managed Care – PPO | Admitting: Hematology and Oncology

## 2021-06-06 ENCOUNTER — Telehealth: Payer: Self-pay | Admitting: Genetic Counselor

## 2021-06-06 ENCOUNTER — Encounter: Payer: Self-pay | Admitting: Hematology and Oncology

## 2021-06-06 VITALS — BP 140/92 | HR 78 | Temp 97.9°F | Resp 16 | Ht 66.0 in | Wt 261.1 lb

## 2021-06-06 DIAGNOSIS — D0512 Intraductal carcinoma in situ of left breast: Secondary | ICD-10-CM | POA: Diagnosis present

## 2021-06-06 DIAGNOSIS — Z8042 Family history of malignant neoplasm of prostate: Secondary | ICD-10-CM

## 2021-06-06 DIAGNOSIS — Z800 Family history of malignant neoplasm of digestive organs: Secondary | ICD-10-CM

## 2021-06-06 DIAGNOSIS — Z803 Family history of malignant neoplasm of breast: Secondary | ICD-10-CM | POA: Diagnosis not present

## 2021-06-06 NOTE — Progress Notes (Signed)
[name] ?CONSULT NOTE ? ?Patient Care Team: ?Mckinley Jewel, MD as PCP - General (Internal Medicine) ? ?CHIEF COMPLAINTS/PURPOSE OF CONSULTATION:  ?Newly diagnosed breast cancer ? ?HISTORY OF PRESENTING ILLNESS:  ?Joan Dalton 46 y.o. female is here because of recent diagnosis of left breast cancer ? ?I reviewed her records extensively and collaborated the history with the patient. ? ?SUMMARY OF ONCOLOGIC HISTORY: ?Oncology History  ?Ductal carcinoma in situ (DCIS) of left breast  ?05/20/2021 Initial Diagnosis  ? Ductal carcinoma in situ (DCIS) of left breast ?  ?06/05/2021 Genetic Testing  ? Negative genetic testing on the CancerNext-Expanded+RNAinsight panel.  The report date is June 05, 2021. ? ?The CancerNext-Expanded gene panel offered by [facility] and includes sequencing and rearrangement analysis for the following 77 genes: AIP, ALK, APC*, ATM*, AXIN2, BAP1, BARD1, BLM, BMPR1A, BRCA1*, BRCA2*, BRIP1*, CDC73, CDH1*, CDK4, CDKN1B, CDKN2A, CHEK2*, CTNNA1, DICER1, FANCC, FH, FLCN, GALNT12, KIF1B, LZTR1, MAX, MEN1, MET, MLH1*, MSH2*, MSH3, MSH6*, MUTYH*, NBN, NF1*, NF2, NTHL1, PALB2*, PHOX2B, PMS2*, POT1, PRKAR1A, PTCH1, PTEN*, RAD51C*, RAD51D*, RB1, RECQL, RET, SDHA, SDHAF2, SDHB, SDHC, SDHD, SMAD4, SMARCA4, SMARCB1, SMARCE1, STK11, SUFU, TMEM127, TP53*, TSC1, TSC2, VHL and XRCC2 (sequencing and deletion/duplication); EGFR, EGLN1, HOXB13, KIT, MITF, PDGFRA, POLD1, and POLE (sequencing only); EPCAM and GREM1 (deletion/duplication only). DNA and RNA analyses performed for * genes.  ?  ? ? ? ?MEDICAL HISTORY:  ?Past Medical History:  ?Diagnosis Date  ? Breast cancer ([place]) 05/08/2021  ? Family history of breast cancer   ? Family history of colon cancer   ? Family history of pancreatic cancer   ? ? ?SURGICAL HISTORY: ?Past Surgical History:  ?Procedure Laterality Date  ? BREAST BIOPSY Left 05/08/2021  ? ? ?SOCIAL HISTORY: ?Social History  ? ?Socioeconomic History  ? Marital status:  Married  ?  Spouse name: Not on file  ? Number of children: Not on file  ? Years of education: Not on file  ? Highest education level: Not on file  ?Occupational History  ? Not on file  ?Tobacco Use  ? Smoking status: Never  ? Smokeless tobacco: Never  ?Substance and Sexual Activity  ? Alcohol use: Never  ? Drug use: Never  ? Sexual activity: Not on file  ?Other Topics Concern  ? Not on file  ?Social History Narrative  ? Not on file  ? ?Social Determinants of Health  ? ?Financial Resource Strain: Not on file  ?Food Insecurity: Not on file  ?Transportation Needs: Not on file  ?Physical Activity: Not on file  ?Stress: Not on file  ?Social Connections: Not on file  ?Intimate Partner Violence: Not At Risk  ? Fear of Current or Ex-Partner: No  ? Emotionally Abused: No  ? Physically Abused: No  ? Sexually Abused: No  ? ? ?FAMILY HISTORY: ?Family History  ?Problem Relation Age of Onset  ? Breast cancer Mother   ?     39s & 74s  ? Prostate cancer Father   ? Breast cancer Maternal Aunt 75  ? Pancreatic cancer Maternal Aunt 36  ? Breast cancer Maternal Aunt   ?     later 30s-early 56s  ? Colon cancer Maternal Aunt 84  ? Colon cancer Maternal Uncle   ?     77s  ? Colon cancer Maternal Grandfather   ? Pancreatic cancer Cousin   ?     mother's maternal first cousin  ? Pancreatic cancer Cousin   ?     mother's maternal  first cousin  ? ? ?ALLERGIES:  is allergic to sulfamethoxazole-trimethoprim. ? ?MEDICATIONS:  ?Current Outpatient Medications  ?Medication Sig Dispense Refill  ? acetaminophen (TYLENOL) 500 MG tablet Take 1,000 mg by mouth every 6 (six) hours as needed for moderate pain or headache.    ? COD LIVER OIL PO Take 5 mLs by mouth 3 (three) times a week.    ? ferrous sulfate 325 (65 FE) MG tablet Take 325 mg by mouth daily in the afternoon.    ? ibuprofen (ADVIL) 200 MG tablet Take 400 mg by mouth every 6 (six) hours as needed for moderate pain.    ? Multiple Vitamin (MULTI-VITAMIN) tablet Take 1 tablet by mouth daily.     ? VITAMIN D PO Take 1 capsule by mouth daily.    ? ?No current facility-administered medications for this visit.  ? ? ? ?PHYSICAL EXAMINATION: ?ECOG PERFORMANCE STATUS: 0 - Asymptomatic ? ?Vitals:  ? 06/06/21 1405  ?BP: (!) 140/92  ?Pulse: 78  ?Resp: 16  ?Temp: 97.9 ?F (36.6 ?C)  ?SpO2: 99%  ? ?Filed Weights  ? 06/06/21 1405  ?Weight: 261 lb 1.6 oz (118.4 kg)  ? ?PE deferred in lieu of counseling ? ?LABORATORY DATA:  ?I have reviewed the data as listed ?No results found for: WBC, HGB, HCT, MCV, PLT ?No results found for: NA, K, CL, CO2 ? ?RADIOGRAPHIC STUDIES: ?I have personally reviewed the radiological reports and agreed with the findings in the report. ? ?ASSESSMENT AND PLAN:  ? ?This is a very pleasant 46 year old female patient (menopausal status unknown, last.  Several years ago but had endometrial ablation) referred to hematology/oncology for DCIS of the right breast and recommendations regarding the above.  We have discussed the following findings about DCIS. ? ?Pathology review: I discussed with the patient the difference between DCIS and invasive breast cancer. It is considered a precancerous lesion. DCIS is classified as a Stage 0 breast cancer. It is generally detected through mammograms as calcification. We also discussed the importance of ER and PR receptors and their implications to adjuvant treatment options. Prognosis of DCIS dependence on grade and degree of comedo necrosis. It is anticipated that if not treated, 20-30% of DCIS can develop into invasive breast cancer. ? ?Recommendation: ?1. Breast conserving surgery ?2. Followed by adjuvant radiation therapy ?3. Followed by antiestrogen therapy with tamoxifen/aromatase inhibitors based on menopausal status ?5 years ? ?Tamoxifen counseling: We discussed the risks and benefits of tamoxifen. These include but not limited to insomnia, hot flashes, mood changes, vaginal dryness, and weight gain. Although rare, serious side effects including endometrial  cancer, risk of blood clots were also discussed. We strongly believe that the benefits far outweigh the risks. Patient understands these risks and consented to starting treatment. Planned treatment duration is 5 years. ? ?Aromatase inhibitors counseling: We have discussed the mechanism of action of aromatase inhibitors today.  We have discussed adverse effects including but not limited to menopausal symptoms, increased risk of osteoporosis and fractures, cardiovascular events, arthralgias and myalgias.  We do believe that the benefits far outweigh the risks.  Plan treatment duration of 5 years. ? ?Since her menopausal status is unclear, we may have to start with tamoxifen however she also had an episode of unprovoked PE about 2 years ago for which she remained on anticoagulation for almost a year.  We will try to request records from her hematologist in [place].  She will return to clinic in second week of May after completion of radiation  to initiate antiestrogen therapy.  She understands that given her prior episode of PE, her risk of DVT/PE might be slightly higher with tamoxifen use.  I have offered considering ovarian suppression and concurrent aromatase inhibitors although this is not something we use in routine practice.  She is not really keen on doing any ovarian suppression, she is worried that menopause will bring other issues. ?We will also do estradiol and FSH levels today. ?I was going to talk to her about the specimen collection study but.  It appears that she may have left without the labs. ? ?Thank you for consulting [place] in the care of this patient.  Please do not hesitate to contact us with any questions ?All questions were answered. The patient knows to call the clinic with any problems, questions or concerns. ?  ? Benay Pike, MD ?06/06/21 ? ?

## 2021-06-06 NOTE — Progress Notes (Signed)
HPI:  Joan Dalton was previously seen in the [place] clinic due to a personal and family history of cancer and concerns regarding a hereditary predisposition to cancer. Please refer to our prior cancer genetics clinic note for more information regarding our discussion, assessment and recommendations, at the time. Joan Dalton recent genetic test results were disclosed to her, as were recommendations warranted by these results. These results and recommendations are discussed in more detail below. ? ?CANCER HISTORY:  ?Oncology History  ?Ductal carcinoma in situ (DCIS) of left breast  ?05/20/2021 Initial Diagnosis  ? Ductal carcinoma in situ (DCIS) of left breast ?  ?06/05/2021 Genetic Testing  ? Negative genetic testing on the CancerNext-Expanded+RNAinsight panel.  The report date is June 05, 2021. ? ?The CancerNext-Expanded gene panel offered by [facility] and includes sequencing and rearrangement analysis for the following 77 genes: AIP, ALK, APC*, ATM*, AXIN2, BAP1, BARD1, BLM, BMPR1A, BRCA1*, BRCA2*, BRIP1*, CDC73, CDH1*, CDK4, CDKN1B, CDKN2A, CHEK2*, CTNNA1, DICER1, FANCC, FH, FLCN, GALNT12, KIF1B, LZTR1, MAX, MEN1, MET, MLH1*, MSH2*, MSH3, MSH6*, MUTYH*, NBN, NF1*, NF2, NTHL1, PALB2*, PHOX2B, PMS2*, POT1, PRKAR1A, PTCH1, PTEN*, RAD51C*, RAD51D*, RB1, RECQL, RET, SDHA, SDHAF2, SDHB, SDHC, SDHD, SMAD4, SMARCA4, SMARCB1, SMARCE1, STK11, SUFU, TMEM127, TP53*, TSC1, TSC2, VHL and XRCC2 (sequencing and deletion/duplication); EGFR, EGLN1, HOXB13, KIT, MITF, PDGFRA, POLD1, and POLE (sequencing only); EPCAM and GREM1 (deletion/duplication only). DNA and RNA analyses performed for * genes.  ?  ? ? ?FAMILY HISTORY:  ?We obtained a detailed, 4-generation family history.  Significant diagnoses are listed below: ?Family History  ?Problem Relation Age of Onset  ? Breast cancer Mother   ?     41s & 75s  ? Prostate cancer Father   ? Breast cancer Maternal Aunt 75  ? Pancreatic cancer Maternal Aunt 37  ?  Breast cancer Maternal Aunt   ?     later 30s-early 70s  ? Colon cancer Maternal Aunt 80  ? Colon cancer Maternal Uncle   ?     66s  ? Colon cancer Maternal Grandfather   ? Pancreatic cancer Cousin   ?     mother's maternal first cousin  ? Pancreatic cancer Cousin   ?     mother's maternal first cousin  ? ? ?  ?The patient has five children who are all cancer free.  She has three sisters and a brother who are all cancer free. She also has a paternal half sister who's health she is unaware of.  Her parents are living. ?  ?The patient's father was diagnosed with prostate cancer. He was adopted but they have recently become aware of his biological family.  He has two sisters and a brother - none had cancer.  His biological parents are deceased. ?  ?The patient's mother had breast cancer at 77 and again in her 28's. She has six sisters and a brother.  The brother died of lung cancer, one sister had breast and pancreatic cancer at 58, a second sister had breast cancer in her 110's-40's, and a third sister had colon cancer at 81.  One sister, who did not have cancer, had a daughter who had cancer in the muscle/tissue of her leg.  The maternal grandfather had colon cancer and the grandmother died of COPD.  She has two nieces with pancreatic cancer. ?  ?Joan Dalton is unaware of previous family history of genetic testing for hereditary cancer risks. Patient's maternal ancestors are of African American descent, and paternal ancestors are of  African American and [place] descent. There is no reported Ashkenazi Jewish ancestry. There is no known consanguinity. ? ?GENETIC TEST RESULTS: Genetic testing reported out on June 05, 2021 through the CancerNext-Expanded+RNAinsight cancer panel found no pathogenic mutations. The CancerNext-Expanded gene panel offered by [facility] and includes sequencing and rearrangement analysis for the following 77 genes: AIP, ALK, APC*, ATM*, AXIN2, BAP1, BARD1, BLM, BMPR1A, BRCA1*, BRCA2*, BRIP1*,  CDC73, CDH1*, CDK4, CDKN1B, CDKN2A, CHEK2*, CTNNA1, DICER1, FANCC, FH, FLCN, GALNT12, KIF1B, LZTR1, MAX, MEN1, MET, MLH1*, MSH2*, MSH3, MSH6*, MUTYH*, NBN, NF1*, NF2, NTHL1, PALB2*, PHOX2B, PMS2*, POT1, PRKAR1A, PTCH1, PTEN*, RAD51C*, RAD51D*, RB1, RECQL, RET, SDHA, SDHAF2, SDHB, SDHC, SDHD, SMAD4, SMARCA4, SMARCB1, SMARCE1, STK11, SUFU, TMEM127, TP53*, TSC1, TSC2, VHL and XRCC2 (sequencing and deletion/duplication); EGFR, EGLN1, HOXB13, KIT, MITF, PDGFRA, POLD1, and POLE (sequencing only); EPCAM and GREM1 (deletion/duplication only). DNA and RNA analyses performed for * genes. The test report has been scanned into EPIC and is located under the Molecular Pathology section of the Results Review tab.  A portion of the result report is included below for reference.  ? ? ? ?We discussed with Joan Dalton that because current genetic testing is not perfect, it is possible there may be a gene mutation in one of these genes that current testing cannot detect, but that chance is small.  We also discussed, that there could be another gene that has not yet been discovered, or that we have not yet tested, that is responsible for the cancer diagnoses in the family. It is also possible there is a hereditary cause for the cancer in the family that Joan Dalton did not inherit and therefore was not identified in her testing.  Therefore, it is important to remain in touch with cancer genetics in the future so that we can continue to offer Joan Dalton the most up to date genetic testing.  ? ?ADDITIONAL GENETIC TESTING: We discussed with Joan Dalton that her genetic testing was fairly extensive.  If there are genes identified to increase cancer risk that can be analyzed in the future, we would be happy to discuss and coordinate this testing at that time.   ? ?CANCER SCREENING RECOMMENDATIONS: Joan Dalton test result is considered negative (normal).  This means that we have not identified a hereditary cause for her personal and family history  of cancer at this time. Most cancers happen by chance and this negative test suggests that her cancer may fall into this category.   ? ?While reassuring, this does not definitively rule out a hereditary predisposition to cancer. It is still possible that there could be genetic mutations that are undetectable by current technology. There could be genetic mutations in genes that have not been tested or identified to increase cancer risk.  Therefore, it is recommended she continue to follow the cancer management and screening guidelines provided by her oncology and primary healthcare provider.  ? ?An individual's cancer risk and medical management are not determined by genetic test results alone. Overall cancer risk assessment incorporates additional factors, including personal medical history, family history, and any available genetic information that may result in a personalized plan for cancer prevention and surveillance ? ?RECOMMENDATIONS FOR FAMILY MEMBERS:  Individuals in this family might be at some increased risk of developing cancer, over the general population risk, simply due to the family history of cancer.  We recommended women in this family have a yearly mammogram beginning at age 39, or 74 years younger than the earliest onset of cancer, an  annual clinical breast exam, and perform monthly breast self-exams. Women in this family should also have a gynecological exam as recommended by their primary provider. All family members should be referred for colonoscopy starting at age 64. ? ?It is also possible there is a hereditary cause for the cancer in Ms. Calef's family that she did not inherit and therefore was not identified in her.  Based on Joan Dalton's family history, we recommended her mother, who was diagnosed with breast cancer at age 6, or her maternal aunt who had colon cancer diagnosed at 108, have genetic counseling and testing. Joan Dalton will let us know if we can be of any assistance in  coordinating genetic counseling and/or testing for this family member.  ? ?FOLLOW-UP: Lastly, we discussed with Joan Dalton that cancer genetics is a rapidly advancing field and it is possible that new genetic tests

## 2021-06-06 NOTE — Telephone Encounter (Signed)
Revealed negative genetic testing.  Discussed that we do not know why she has DCIS breast cancer or why there is cancer in the family. It could be due to a different gene that we are not testing, or maybe our current technology may not be able to pick something up.  It will be important for her to keep in contact with genetics to keep up with whether additional testing may be needed. ? ? ?

## 2021-06-07 ENCOUNTER — Telehealth: Payer: Self-pay | Admitting: Hematology and Oncology

## 2021-06-07 NOTE — Telephone Encounter (Signed)
Scheduled appointment per 03/16 los. Patient aware.  ? ?

## 2021-06-10 ENCOUNTER — Other Ambulatory Visit: Payer: Self-pay

## 2021-06-10 ENCOUNTER — Encounter: Payer: Self-pay | Admitting: Plastic Surgery

## 2021-06-10 ENCOUNTER — Ambulatory Visit (INDEPENDENT_AMBULATORY_CARE_PROVIDER_SITE_OTHER): Payer: BC Managed Care – PPO | Admitting: Plastic Surgery

## 2021-06-10 DIAGNOSIS — D0512 Intraductal carcinoma in situ of left breast: Secondary | ICD-10-CM | POA: Diagnosis not present

## 2021-06-10 NOTE — Progress Notes (Signed)
? ?  Subjective:  ? ? Patient ID: Joan Dalton, female    DOB: [birth year], 46 y.o.   MRN: [id number] ? ?HPI ?The patient is a 46 year old female joining me by phone.  She was seen February 28 with a diagnosis of breast cancer of the left breast.  It was ductal carcinoma in situ located in the upper outer quadrant.  She is 5 feet 6 inches tall and weighs 268 pounds.  Her preoperative bra size is a C/D cup.  She was waiting on genetics to decide on her surgical treatment plan.  She does have a history of pulmonary embolism.  She is currently studying at [employer].  Fortunately her genetics came back negative.  The patient has decided she would like to undergo bilateral oncoplastic breast reductions/mastopexy at the time of her left partial mastectomy. ? ?Review of Systems ? ?   ?Objective:  ? Physical Exam ? ? ?   ?Assessment & Plan:  ? ?  ICD-10-CM   ?1. Ductal carcinoma in situ (DCIS) of left breast  D05.12   ?  ?  ?I connected with  [name] on 06/10/21 by phone and verified that I am speaking with the correct person using two identifiers.  The patient was at home and I was at the office.  I spent 15 minutes in the following manner: Review of chart, discussion with patient, documentation and request for surgery.  I also notified Dr. Marlou Starks of the patient's decision. ?  ?We will plan on bilateral immediate oncoplastic breast reduction. ? ?I discussed the limitations of evaluation and management by telemedicine. The patient expressed understanding and agreed to proceed. ? ? ?

## 2021-06-11 ENCOUNTER — Telehealth: Payer: BC Managed Care – PPO | Admitting: Plastic Surgery

## 2021-06-11 NOTE — Progress Notes (Signed)
Surgical Instructions ? ? ? Your procedure is scheduled on Monday March 27. ? Report to [facility] Main Entrance "A" at 12:30 P.M., then check in with the Admitting office. ? Call this number if you have problems the morning of surgery: ? [phone] ? ? If you have any questions prior to your surgery date call [phone]: Open Monday-Friday 8am-4pm ? ? ? Remember: ? Do not eat after midnight the night before your surgery ? ?You may drink clear liquids until 11:30AM the morning of your surgery.   ?Clear liquids allowed are: Water, Non-Citrus Juices (without pulp), Carbonated Beverages, Clear Tea, Black Coffee ONLY (NO MILK, CREAM OR POWDERED CREAMER of any kind), and Gatorade ?  ? Take these medicines the morning of surgery with A SIP OF WATER:  ?acetaminophen (TYLENOL) if needed ? ? ?As of today, STOP taking any Aspirin (unless otherwise instructed by your surgeon) Aleve, Naproxen, Ibuprofen, Motrin, Advil, Goody's, BC's, all herbal medications, fish oil, and all vitamins. ? ?         ?Do not wear jewelry or makeup ?Do not wear lotions, powders, perfumes/colognes, or deodorant. ?Do not shave 48 hours prior to surgery.  Men may shave face and neck. ?Do not bring valuables to the hospital. ?Do not wear nail polish, gel polish, artificial nails, or any other type of covering on natural nails (fingers and toes) ?If you have artificial nails or gel coating that need to be removed by a nail salon, please have this removed prior to surgery. Artificial nails or gel coating may interfere with anesthesia's ability to adequately monitor your vital signs. ? ?[facility] is not responsible for any belongings or valuables. .  ? ?Do NOT Smoke (Tobacco/Vaping)  24 hours prior to your procedure ? ?If you use a CPAP at night, you may bring your mask for your overnight stay. ?  ?Contacts, glasses, hearing aids, dentures or partials may not be worn into surgery, please bring cases for these belongings ?  ?For patients admitted to  the hospital, discharge time will be determined by your treatment team. ?  ?Patients discharged the day of surgery will not be allowed to drive home, and someone needs to stay with them for 24 hours. ? ?NO VISITORS WILL BE ALLOWED IN PRE-OP WHERE PATIENTS ARE PREPPED FOR SURGERY.  ONLY 1 SUPPORT PERSON MAY BE PRESENT IN THE WAITING ROOM WHILE YOU ARE IN SURGERY.  IF YOU ARE TO BE ADMITTED, ONCE YOU ARE IN YOUR ROOM YOU WILL BE ALLOWED TWO (2) VISITORS. 1 (ONE) VISITOR MAY STAY OVERNIGHT BUT MUST ARRIVE TO THE ROOM BY 8pm.  Minor children may have two parents present. Special consideration for safety and communication needs will be reviewed on a case by case basis. ? ?Special instructions:   ? ?Oral Hygiene is also important to reduce your risk of infection.  Remember - BRUSH YOUR TEETH THE MORNING OF SURGERY WITH YOUR REGULAR TOOTHPASTE ? ? ?[facility]- Preparing For Surgery ? ?Before surgery, you can play an important role. Because skin is not sterile, your skin needs to be as free of germs as possible. You can reduce the number of germs on your skin by washing with CHG (chlorahexidine gluconate) Soap before surgery.  CHG is an antiseptic cleaner which kills germs and bonds with the skin to continue killing germs even after washing.   ? ? ?Please do not use if you have an allergy to CHG or antibacterial soaps. If your skin becomes reddened/irritated stop using the  CHG.  ?Do not shave (including legs and underarms) for at least 48 hours prior to first CHG shower. It is OK to shave your face. ? ?Please follow these instructions carefully. ?  ? ? Shower the NIGHT BEFORE SURGERY and the MORNING OF SURGERY with CHG Soap.  ? If you chose to wash your hair, wash your hair first as usual with your normal shampoo. After you shampoo, rinse your hair and body thoroughly to remove the shampoo.  Then [employer] and genitals (private parts) with your normal soap and rinse thoroughly to remove soap. ? ?After that Use CHG Soap as  you would any other liquid soap. You can apply CHG directly to the skin and wash gently with a scrungie or a clean washcloth.  ? ?Apply the CHG Soap to your body ONLY FROM THE NECK DOWN.  Do not use on open wounds or open sores. Avoid contact with your eyes, ears, mouth and genitals (private parts). Wash Face and genitals (private parts)  with your normal soap.  ? ?Wash thoroughly, paying special attention to the area where your surgery will be performed. ? ?Thoroughly rinse your body with warm water from the neck down. ? ?DO NOT shower/wash with your normal soap after using and rinsing off the CHG Soap. ? ?Pat yourself dry with a CLEAN TOWEL. ? ?Wear CLEAN PAJAMAS to bed the night before surgery ? ?Place CLEAN SHEETS on your bed the night before your surgery ? ?DO NOT SLEEP WITH PETS. ? ? ?Day of Surgery: ? ?Take a shower with CHG soap. ?Wear Clean/Comfortable clothing the morning of surgery ?Do not apply any deodorants/lotions.   ?Remember to brush your teeth WITH YOUR REGULAR TOOTHPASTE. ? ? ?Notify your provider: ?if you are in close contact with someone who has COVID  ?or if you develop a fever of 100.4 or greater, sneezing, cough, sore throat, shortness of breath or body aches. ? ?  ?Please read over the following fact sheets that you were given.  ? ?

## 2021-06-12 ENCOUNTER — Encounter (HOSPITAL_COMMUNITY)
Admission: RE | Admit: 2021-06-12 | Discharge: 2021-06-12 | Disposition: A | Discharge status: Home / Self Care | Payer: BC Managed Care – PPO | Source: Ambulatory Visit | Attending: General Surgery | Admitting: General Surgery

## 2021-06-12 ENCOUNTER — Other Ambulatory Visit: Payer: Self-pay

## 2021-06-12 ENCOUNTER — Encounter (HOSPITAL_COMMUNITY): Payer: Self-pay

## 2021-06-12 VITALS — BP 133/88 | HR 66 | Temp 97.9°F | Resp 17 | Ht 66.0 in | Wt 257.7 lb

## 2021-06-12 DIAGNOSIS — D0512 Intraductal carcinoma in situ of left breast: Secondary | ICD-10-CM | POA: Diagnosis not present

## 2021-06-12 DIAGNOSIS — Z01812 Encounter for preprocedural laboratory examination: Secondary | ICD-10-CM | POA: Diagnosis present

## 2021-06-12 LAB — CBC
HCT: 39.2 % (ref 36.0–46.0)
Hemoglobin: 13.1 g/dL (ref 12.0–15.0)
MCH: 31 pg (ref 26.0–34.0)
MCHC: 33.4 g/dL (ref 30.0–36.0)
MCV: 92.9 fL (ref 80.0–100.0)
Platelets: 251 10*3/uL (ref 150–400)
RBC: 4.22 MIL/uL (ref 3.87–5.11)
RDW: 12.4 % (ref 11.5–15.5)
WBC: 4 10*3/uL (ref 4.0–10.5)
nRBC: 0 % (ref 0.0–0.2)

## 2021-06-12 LAB — BASIC METABOLIC PANEL
Anion gap: 10 (ref 5–15)
BUN: <5 mg/dL — ABNORMAL LOW (ref 6–20)
CO2: 26 mmol/L (ref 22–32)
Calcium: 9.5 mg/dL (ref 8.9–10.3)
Chloride: 104 mmol/L (ref 98–111)
Creatinine, Ser: 0.76 mg/dL (ref 0.44–1.00)
GFR, Estimated: >60 mL/min (ref >60)
Glucose, Bld: 99 mg/dL (ref 70–99)
Potassium: 4 mmol/L (ref 3.5–5.1)
Sodium: 140 mmol/L (ref 135–145)

## 2021-06-12 NOTE — Progress Notes (Addendum)
PCP - Dr. Early Osmond, MD ?Cardiologist - Denies ? ?Chest x-ray - Not indicated ?EKG - Not indicated ?Stress Test - Denies ?ECHO - Denies ?Cardiac Cath - Denies ? ?Sleep Study - Yes no OSA ? ?DM - Denies ? ?ERAS Protcol - Yes updated time for clears until 8:30am DOS ? ?Anesthesia review: No ? ?Patient denies shortness of breath, fever, cough and chest pain at PAT appointment ? ? ?All instructions explained to the patient, with a verbal understanding of the material. Patient agrees to go over the instructions while at home for a better understanding. The opportunity to ask questions was provided. ? ? ?

## 2021-06-12 NOTE — Progress Notes (Signed)
Patient aware of new time for surgery from 12:30-14:20 with a 10:30 am arrival.  Also made aware of new visitation guidelines.  ?

## 2021-06-13 ENCOUNTER — Ambulatory Visit
Admission: RE | Admit: 2021-06-13 | Discharge: 2021-06-13 | Disposition: A | Discharge status: Home / Self Care | Payer: BC Managed Care – PPO | Source: Ambulatory Visit | Attending: General Surgery | Admitting: General Surgery

## 2021-06-13 DIAGNOSIS — D0512 Intraductal carcinoma in situ of left breast: Secondary | ICD-10-CM

## 2021-06-17 ENCOUNTER — Ambulatory Visit
Admission: RE | Admit: 2021-06-17 | Discharge: 2021-06-17 | Disposition: A | Discharge status: Home / Self Care | Payer: BC Managed Care – PPO | Source: Ambulatory Visit | Attending: General Surgery | Admitting: General Surgery

## 2021-06-17 ENCOUNTER — Other Ambulatory Visit: Payer: Self-pay

## 2021-06-17 ENCOUNTER — Encounter (HOSPITAL_COMMUNITY): Payer: Self-pay | Admitting: General Surgery

## 2021-06-17 ENCOUNTER — Ambulatory Visit (HOSPITAL_COMMUNITY): Payer: BC Managed Care – PPO | Admitting: Certified Registered"

## 2021-06-17 ENCOUNTER — Encounter (HOSPITAL_COMMUNITY)
Admission: RE | Disposition: A | Discharge status: Home / Self Care | Payer: Self-pay | Source: Home / Self Care | Attending: General Surgery

## 2021-06-17 ENCOUNTER — Encounter: Payer: Self-pay | Admitting: *Deleted

## 2021-06-17 ENCOUNTER — Ambulatory Visit (HOSPITAL_COMMUNITY)
Admission: RE | Admit: 2021-06-17 | Discharge: 2021-06-17 | Disposition: A | Discharge status: Home / Self Care | Payer: BC Managed Care – PPO | Attending: General Surgery | Admitting: General Surgery

## 2021-06-17 DIAGNOSIS — D0512 Intraductal carcinoma in situ of left breast: Secondary | ICD-10-CM

## 2021-06-17 DIAGNOSIS — Z6841 Body Mass Index (BMI) 40.0 and over, adult: Secondary | ICD-10-CM | POA: Insufficient documentation

## 2021-06-17 DIAGNOSIS — Z803 Family history of malignant neoplasm of breast: Secondary | ICD-10-CM | POA: Diagnosis not present

## 2021-06-17 HISTORY — PX: AXILLARY SENTINEL NODE BIOPSY: SHX5738

## 2021-06-17 HISTORY — PX: BREAST LUMPECTOMY WITH RADIOACTIVE SEED AND SENTINEL LYMPH NODE BIOPSY: SHX6550

## 2021-06-17 SURGERY — BREAST LUMPECTOMY WITH RADIOACTIVE SEED AND SENTINEL LYMPH NODE BIOPSY
Anesthesia: General | Site: Breast | Laterality: Left

## 2021-06-17 MED ORDER — SUCCINYLCHOLINE CHLORIDE 200 MG/10ML IV SOSY
PREFILLED_SYRINGE | INTRAVENOUS | Status: AC
  Filled 2021-06-17: qty 10

## 2021-06-17 MED ORDER — DEXAMETHASONE SODIUM PHOSPHATE 10 MG/ML IJ SOLN
INTRAMUSCULAR | Status: DC | PRN
  Administered 2021-06-17: 10 mg via INTRAVENOUS

## 2021-06-17 MED ORDER — ONDANSETRON HCL 4 MG/2ML IJ SOLN
INTRAMUSCULAR | Status: DC | PRN
  Administered 2021-06-17: 4 mg via INTRAVENOUS

## 2021-06-17 MED ORDER — CHLORHEXIDINE GLUCONATE CLOTH 2 % EX PADS: 6.0000 | MEDICATED_PAD | Freq: Once | CUTANEOUS | Status: DC

## 2021-06-17 MED ORDER — CHLORHEXIDINE GLUCONATE 0.12 % MT SOLN
15.0000 mL | Freq: Once | OROMUCOSAL | Status: AC
  Administered 2021-06-17: 15 mL via OROMUCOSAL
  Filled 2021-06-17: qty 15

## 2021-06-17 MED ORDER — PHENYLEPHRINE 40 MCG/ML (10ML) SYRINGE FOR IV PUSH (FOR BLOOD PRESSURE SUPPORT)
PREFILLED_SYRINGE | INTRAVENOUS | Status: AC
  Filled 2021-06-17: qty 10

## 2021-06-17 MED ORDER — MIDAZOLAM HCL 2 MG/2ML IJ SOLN
2.0000 mg | Freq: Once | INTRAMUSCULAR | Status: AC
Start: 2021-06-17 — End: 2021-06-17

## 2021-06-17 MED ORDER — BUPIVACAINE-EPINEPHRINE 0.25% -1:200000 IJ SOLN
INTRAMUSCULAR | Status: DC | PRN
  Administered 2021-06-17: 18 mL

## 2021-06-17 MED ORDER — LIDOCAINE HCL (CARDIAC) PF 100 MG/5ML IV SOSY
PREFILLED_SYRINGE | INTRAVENOUS | Status: DC | PRN
  Administered 2021-06-17: 40 mg via INTRAVENOUS

## 2021-06-17 MED ORDER — ROCURONIUM BROMIDE 10 MG/ML (PF) SYRINGE
PREFILLED_SYRINGE | INTRAVENOUS | Status: DC | PRN
  Administered 2021-06-17: 40 mg via INTRAVENOUS

## 2021-06-17 MED ORDER — MAGTRACE LYMPHATIC TRACER
INTRAMUSCULAR | Status: DC | PRN
  Administered 2021-06-17: 3 mL via INTRAMUSCULAR

## 2021-06-17 MED ORDER — ROCURONIUM BROMIDE 10 MG/ML (PF) SYRINGE
PREFILLED_SYRINGE | INTRAVENOUS | Status: AC
  Filled 2021-06-17: qty 10

## 2021-06-17 MED ORDER — LIDOCAINE 2% (20 MG/ML) 5 ML SYRINGE
INTRAMUSCULAR | Status: AC
  Filled 2021-06-17: qty 5

## 2021-06-17 MED ORDER — DEXAMETHASONE SODIUM PHOSPHATE 10 MG/ML IJ SOLN
INTRAMUSCULAR | Status: AC
  Filled 2021-06-17: qty 1

## 2021-06-17 MED ORDER — FENTANYL CITRATE (PF) 100 MCG/2ML IJ SOLN: 100.0000 ug | Freq: Once | INTRAMUSCULAR | Status: AC

## 2021-06-17 MED ORDER — SUCCINYLCHOLINE CHLORIDE 200 MG/10ML IV SOSY
PREFILLED_SYRINGE | INTRAVENOUS | Status: DC | PRN
  Administered 2021-06-17: 120 mg via INTRAVENOUS

## 2021-06-17 MED ORDER — SUGAMMADEX SODIUM 500 MG/5ML IV SOLN
INTRAVENOUS | Status: AC
  Filled 2021-06-17: qty 5

## 2021-06-17 MED ORDER — 0.9 % SODIUM CHLORIDE (POUR BTL) OPTIME
TOPICAL | Status: DC | PRN
  Administered 2021-06-17: 1000 mL

## 2021-06-17 MED ORDER — PROPOFOL 10 MG/ML IV BOLUS
INTRAVENOUS | Status: DC | PRN
  Administered 2021-06-17: 400 mg via INTRAVENOUS

## 2021-06-17 MED ORDER — BUPIVACAINE-EPINEPHRINE (PF) 0.25% -1:200000 IJ SOLN
INTRAMUSCULAR | Status: AC
  Filled 2021-06-17: qty 30

## 2021-06-17 MED ORDER — GABAPENTIN 300 MG PO CAPS
300.0000 mg | ORAL_CAPSULE | ORAL | Status: AC
  Administered 2021-06-17: 300 mg via ORAL
  Filled 2021-06-17: qty 1

## 2021-06-17 MED ORDER — PROPOFOL 10 MG/ML IV BOLUS
INTRAVENOUS | Status: AC
  Filled 2021-06-17: qty 20

## 2021-06-17 MED ORDER — FENTANYL CITRATE (PF) 250 MCG/5ML IJ SOLN
INTRAMUSCULAR | Status: AC
  Filled 2021-06-17: qty 5

## 2021-06-17 MED ORDER — LACTATED RINGERS IV SOLN: INTRAVENOUS | Status: DC

## 2021-06-17 MED ORDER — MIDAZOLAM HCL 2 MG/2ML IJ SOLN
INTRAMUSCULAR | Status: AC
Start: 2021-06-17 — End: 2021-06-17
  Administered 2021-06-17: 2 mg via INTRAVENOUS
  Filled 2021-06-17: qty 2

## 2021-06-17 MED ORDER — ORAL CARE MOUTH RINSE: 15.0000 mL | Freq: Once | OROMUCOSAL | Status: AC

## 2021-06-17 MED ORDER — OXYCODONE HCL 5 MG PO TABS
ORAL_TABLET | ORAL | Status: AC
  Filled 2021-06-17: qty 1

## 2021-06-17 MED ORDER — FENTANYL CITRATE (PF) 100 MCG/2ML IJ SOLN
INTRAMUSCULAR | Status: DC | PRN
  Administered 2021-06-17 (×2): 50 ug via INTRAVENOUS

## 2021-06-17 MED ORDER — PHENYLEPHRINE HCL (PRESSORS) 10 MG/ML IV SOLN
INTRAVENOUS | Status: DC | PRN
Start: 2021-06-17 — End: 2021-06-17
  Administered 2021-06-17: 40 ug via INTRAVENOUS

## 2021-06-17 MED ORDER — FENTANYL CITRATE (PF) 100 MCG/2ML IJ SOLN
INTRAMUSCULAR | Status: AC
  Administered 2021-06-17: 100 ug via INTRAVENOUS
  Filled 2021-06-17: qty 2

## 2021-06-17 MED ORDER — ACETAMINOPHEN 500 MG PO TABS
1000.0000 mg | ORAL_TABLET | ORAL | Status: AC
  Administered 2021-06-17: 1000 mg via ORAL
  Filled 2021-06-17: qty 2

## 2021-06-17 MED ORDER — OXYCODONE HCL 5 MG PO TABS: 5.0000 mg | ORAL_TABLET | Freq: Four times a day (QID) | ORAL | 0 refills | Status: DC | PRN

## 2021-06-17 MED ORDER — CEFAZOLIN SODIUM-DEXTROSE 2-4 GM/100ML-% IV SOLN
2.0000 g | INTRAVENOUS | Status: AC
  Administered 2021-06-17: 2 g via INTRAVENOUS
  Filled 2021-06-17: qty 100

## 2021-06-17 MED ORDER — ONDANSETRON HCL 4 MG/2ML IJ SOLN
INTRAMUSCULAR | Status: AC
  Filled 2021-06-17: qty 2

## 2021-06-17 MED ORDER — OXYCODONE HCL 5 MG PO TABS
5.0000 mg | ORAL_TABLET | Freq: Once | ORAL | Status: AC
  Administered 2021-06-17: 5 mg via ORAL
  Filled 2021-06-17: qty 1

## 2021-06-17 MED ORDER — BUPIVACAINE-EPINEPHRINE (PF) 0.5% -1:200000 IJ SOLN
INTRAMUSCULAR | Status: DC | PRN
  Administered 2021-06-17: 30 mL via PERINEURAL

## 2021-06-17 SURGICAL SUPPLY — 31 items
APPLIER CLIP 9.375 MED OPEN (MISCELLANEOUS) ×2
BINDER BREAST LRG (GAUZE/BANDAGES/DRESSINGS) IMPLANT
BINDER BREAST XLRG (GAUZE/BANDAGES/DRESSINGS) IMPLANT
CANISTER SUCT 3000ML PPV (MISCELLANEOUS) ×2 IMPLANT
CHLORAPREP W/TINT 26 (MISCELLANEOUS) ×2 IMPLANT
CLIP APPLIE 9.375 MED OPEN (MISCELLANEOUS) ×1 IMPLANT
CNTNR URN SCR LID CUP LEK RST (MISCELLANEOUS) ×1 IMPLANT
CONT SPEC 4OZ STRL OR WHT (MISCELLANEOUS) ×1
COVER PROBE W GEL 5X96 (DRAPES) ×2 IMPLANT
COVER SURGICAL LIGHT HANDLE (MISCELLANEOUS) ×2 IMPLANT
DERMABOND ADVANCED (GAUZE/BANDAGES/DRESSINGS) ×1
DERMABOND ADVANCED .7 DNX12 (GAUZE/BANDAGES/DRESSINGS) ×1 IMPLANT
DEVICE DUBIN SPECIMEN MAMMOGRA (MISCELLANEOUS) ×2 IMPLANT
DRAPE CHEST BREAST 15X10 FENES (DRAPES) ×2 IMPLANT
ELECT COATED BLADE 2.86 ST (ELECTRODE) ×2 IMPLANT
ELECT REM PT RETURN 9FT ADLT (ELECTROSURGICAL) ×2
ELECTRODE REM PT RTRN 9FT ADLT (ELECTROSURGICAL) ×1 IMPLANT
GLOVE SURG ENC MOIS LTX SZ7.5 (GLOVE) ×4 IMPLANT
GOWN STRL REUS W/ TWL LRG LVL3 (GOWN DISPOSABLE) ×2 IMPLANT
GOWN STRL REUS W/TWL LRG LVL3 (GOWN DISPOSABLE) ×2
KIT BASIN OR (CUSTOM PROCEDURE TRAY) ×2 IMPLANT
KIT MARKER MARGIN INK (KITS) ×2 IMPLANT
NDL HYPO 25GX1X1/2 BEV (NEEDLE) ×1 IMPLANT
NEEDLE HYPO 25GX1X1/2 BEV (NEEDLE) ×2 IMPLANT
NS IRRIG 1000ML POUR BTL (IV SOLUTION) ×2 IMPLANT
PACK GENERAL/GYN (CUSTOM PROCEDURE TRAY) ×2 IMPLANT
SUT MNCRL AB 4-0 PS2 18 (SUTURE) ×4 IMPLANT
SUT VIC AB 3-0 SH 18 (SUTURE) ×2 IMPLANT
SYR CONTROL 10ML LL (SYRINGE) ×2 IMPLANT
TOWEL GREEN STERILE FF (TOWEL DISPOSABLE) ×2 IMPLANT
TRACER MAGTRACE VIAL (MISCELLANEOUS) ×1 IMPLANT

## 2021-06-17 NOTE — Op Note (Signed)
06/17/2021 ? ?1:35 PM ? ?PATIENT:  Joan Dalton  46 y.o. female ? ?PRE-OPERATIVE DIAGNOSIS:  LEFT BREAST DCIS ? ?POST-OPERATIVE DIAGNOSIS:  LEFT BREAST DCIS ? ?PROCEDURE:  Procedure(s): ?LEFT BREAST LUMPECTOMY WITH RADIOACTIVE SEED LOCALIZATION x 2 (Left) ?LEFT DEEP AXILLARY SENTINEL NODE BIOPSY (Left) ? ?SURGEON:  Surgeon(s) and Role: ?   Jovita Kussmaul, MD - Primary ? ?PHYSICIAN ASSISTANT:  ? ?ASSISTANTS: none  ? ?ANESTHESIA:   local and general ? ?EBL:  minimal  ? ?BLOOD ADMINISTERED:none ? ?DRAINS: none  ? ?LOCAL MEDICATIONS USED:  MARCAINE    ? ?SPECIMEN:  Source of Specimen:  left breast tissue with additional superior margin and sentinel nodes x 2 ? ?DISPOSITION OF SPECIMEN:  PATHOLOGY ? ?COUNTS:  YES ? ?TOURNIQUET:  * No tourniquets in log * ? ?DICTATION: .Dragon Dictation ? ?After informed consent was obtained the patient was brought to the operating room and placed in the supine position on the operating table.  After adequate induction of general anesthesia the patient's left chest, breast, and axillary area were prepped with ChloraPrep, allowed to dry, and draped in usual sterile manner.  An appropriate timeout was performed.  Previously 2 I-125 seeds were placed in the upper outer quadrant of the left breast to bracket an area of ductal carcinoma in situ.  At this point, 2 cc of iron oxide were injected in the subareolar plexus on the left and the breast was massaged for 5 minutes.  The signal for the 2 seeds was very high in the upper outer quadrant of the left breast very close to where the lymph nodes were and because of this I elected to do all of the operation through 1 incision.  I made an elliptical incision in the skin overlying the area of radioactivity with a 15 blade knife.  The incision was carried through the skin and subcutaneous tissue sharply with the electrocautery.  Dissection was then carried widely around the radioactive seeds and all the way to the chest wall.  Once the  dissection was complete then the specimen was removed from the patient.  The specimen was oriented with the appropriate paint colors.  A specimen radiograph was obtained that showed the clip and 2 seeds to be within the specimen.  I did elect to take an additional superior margin and this was also marked appropriately.  This tissue was then sent to pathology for further evaluation.  Using the mag trace machine I was able to identify a signal in the left axilla.  Dissection was carried into the deep left axillary space under the direction of the mag trace.  There were 2 areas of signal.  Each of these areas was excised sharply with the electrocautery and the surrounding small vessels and lymphatics were controlled with clips.  These were sent as sentinel nodes numbers 1 and 2.  No other hot or palpable nodes were identified in the left axilla.  Hemostasis was achieved using the Bovie electrocautery.  The wound was irrigated with saline and infiltrated with more quarter percent Marcaine.  The axilla was closed with interrupted 3-0 Vicryl stitches.  The lumpectomy cavity was then closed also with interrupted 3-0 Vicryl stitches.  The skin was then closed with a running 4-0 Monocryl subcuticular stitch.  Dermabond dressings were applied.  The patient tolerated the procedure well.  At the end of the case all needle sponge and instrument counts were correct.  The patient was then awakened and taken to recovery in stable  condition. ? ?PLAN OF CARE: Discharge to home after PACU ? ?PATIENT DISPOSITION:  PACU - hemodynamically stable. ?  ?Delay start of Pharmacological VTE agent (>24hrs) due to surgical blood loss or risk of bleeding: not applicable ? ?

## 2021-06-17 NOTE — H&P (Signed)
?REFERRING PHYSICIAN: Mitzi Hansen, MD ? ?PROVIDER: Landry Corporal, MD ? ?MRN: [id number] ?DOB: 09/30/1975 ?Subjective  ? ?Chief Complaint: Left Breast Cancer ? ? ?History of Present Illness: ?Joan Dalton is a 46 y.o. female who is seen today as an office consultation at the request of Dr. Doristine Bosworth for evaluation of Left Breast Cancer ?.  ? ?We are asked to see the patient in consultation by Dr. Doristine Bosworth to evaluate her for a new left breast cancer. The patient is a 46 year old black female who recently went for routine screening mammogram. At that time she was found to have a collection of calcifications measuring 2.8 cm in the upper outer quadrant of the left breast. 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 She does have a family history of breast cancer in her mother and a maternal aunt. She does not smoke. ? ?Review of Systems: ?A complete review of systems was obtained from the patient. I have reviewed this information and discussed as appropriate with the patient. See HPI as well for other ROS. ? ?ROS  ? ?Medical History: ?Past Medical History:  ?Diagnosis Date  ? DVT (deep venous thrombosis) (CMS-HCC)  ? Pulmonary embolus (CMS-HCC)  ? ?Patient Active Problem List  ?Diagnosis  ? Ductal carcinoma in situ (DCIS) of left breast  ? ?Past Surgical History:  ?Procedure Laterality Date  ? Breast Lumpectomy 10/1998  ? Ablation of Uterus/Uterine Lining 10/2018  ? ? ?Allergies  ?Allergen Reactions  ? Bactrim [Sulfamethoxazole-Trimethoprim] Itching  ? ?Current Outpatient Medications on File Prior to Visit  ?Medication Sig Dispense Refill  ? cholecalciferol (VITAMIN D3) 1000 unit tablet Take by mouth  ? ferrous sulfate 325 (65 FE) MG tablet Take 325 mg by mouth daily with breakfast  ? multivitamin tablet Take 1 tablet by mouth once daily  ? ?No current facility-administered medications on file prior to visit.  ? ?Family History  ?Problem Relation Age of Onset  ?  Deep vein thrombosis (DVT or abnormal blood clot formation) Mother  ? Breast cancer Mother  ? Obesity Father  ? Hyperlipidemia (Elevated cholesterol) Father  ? Coronary Artery Disease (Blocked arteries around heart) Father  ? Diabetes Father  ? Obesity Sister  ? Deep vein thrombosis (DVT or abnormal blood clot formation) Brother  ? Colon cancer Maternal Aunt  ? Breast cancer Maternal Aunt  ? Deep vein thrombosis (DVT or abnormal blood clot formation) Maternal Grandmother  ? ? ?Social History  ? ?Tobacco Use  ?Smoking Status Never  ?Smokeless Tobacco Never  ? ? ?Social History  ? ?Socioeconomic History  ? Marital status: Married  ?Tobacco Use  ? Smoking status: Never  ? Smokeless tobacco: Never  ?Substance and Sexual Activity  ? Alcohol use: Never  ? Drug use: Never  ? ?Objective:  ? ?Vitals:  ?BP: (!) 142/86  ?Pulse: (!) 113  ?Temp: 36.8 ?C (98.3 ?F)  ?SpO2: 98%  ?Weight: (!) 125.7 kg (277 lb 3.2 oz)  ?Height: 167.6 cm ('5\' 6"'$ )  ? ?Body mass index is 44.74 kg/m?. ? ?Physical Exam ?Vitals reviewed.  ?Constitutional:  ?General: She is not in acute distress. ?Appearance: Normal appearance.  ?HENT:  ?Head: Normocephalic and atraumatic.  ?Right Ear: External ear normal.  ?Left Ear: External ear normal.  ?Nose: Nose normal.  ?Mouth/Throat:  ?Mouth: Mucous membranes are moist.  ?Pharynx: Oropharynx is clear.  ?Eyes:  ?General: No scleral icterus. ?Extraocular Movements: Extraocular movements intact.  ?Conjunctiva/sclera: Conjunctivae normal.  ?Pupils:  Pupils are equal, round, and reactive to light.  ?Cardiovascular:  ?Rate and Rhythm: Normal rate and regular rhythm.  ?Pulses: Normal pulses.  ?Heart sounds: Normal heart sounds.  ?Pulmonary:  ?Effort: Pulmonary effort is normal. No respiratory distress.  ?Breath sounds: Normal breath sounds.  ?Abdominal:  ?General: Bowel sounds are normal.  ?Palpations: Abdomen is soft.  ?Tenderness: There is no abdominal tenderness.  ?Musculoskeletal:  ?General: No swelling, tenderness or  deformity. Normal range of motion.  ?Cervical back: Normal range of motion and neck supple.  ?Skin: ?General: Skin is warm and dry.  ?Coloration: Skin is not jaundiced.  ?Neurological:  ?General: No focal deficit present.  ?Mental Status: She is alert and oriented to person, place, and time.  ?Psychiatric:  ?Mood and Affect: Mood normal.  ?Behavior: Behavior normal.  ? ? ? ?Breast: There is no palpable mass in either breast. There is no palpable axillary, supraclavicular, or cervical lymphadenopathy. She does have some tenderness in her rhomboid area ? ?Labs, Imaging and Diagnostic Testing: ? ?Assessment and Plan:  ? ?Diagnoses and all orders for this visit: ? ?Ductal carcinoma in situ (DCIS) of left breast ?- Ambulatory Referral to Oncology-Medical ?- Ambulatory Referral to Radiation Oncology ?- Ambulatory Referral to Plastic Surgery ?- [facility] Case Posting Request; Future ? ? ? ?The patient appears to have a 2.8 cm area of ductal carcinoma in situ in the upper outer quadrant of the left breast. I have discussed with her in detail the different options for treatment and at this point she is undecided between lumpectomy and mastectomy. I will refer her to medical and radiation oncology to discuss treatment again. I will also refer her to genetics given her family history as well as to plastic surgery in case she chooses mastectomy. She will let us know once she has made a definitive choice. I will also refer her to physical therapy for preoperative lymphedema testing and to evaluate her rhomboid pain ? ?She has decided on breast conservation. Will plan for sentinel node biopsy as well given the concern for possible microinvasion. Area will be bracketed with seeds. ?

## 2021-06-17 NOTE — Anesthesia Procedure Notes (Signed)
Anesthesia Regional Block: Pectoralis block  ? ?Pre-Anesthetic Checklist: , timeout performed,  Correct Patient, Correct Site, Correct Laterality,  Correct Procedure, Correct Position, site marked,  Risks and benefits discussed,  Surgical consent,  Pre-op evaluation,  At surgeon's request and post-op pain management ? ?Laterality: Left ? ?Prep: chloraprep     ?  ?Needles:  ?Injection technique: Single-shot ? ?Needle Type: Echogenic Stimulator Needle   ? ? ?Needle Length: 10cm  ?Needle Gauge: 20  ? ? ? ?Additional Needles: ? ? ?Procedures:,,,, ultrasound used (permanent image in chart),,    ?Narrative:  ?Start time: 06/17/2021 11:00 AM ?End time: 06/17/2021 11:10 AM ?Injection made incrementally with aspirations every 5 mL. ? ?Performed by: Personally  ?Anesthesiologist: Murvin Natal, MD ? ?Additional Notes: ?Functioning IV was confirmed and monitors were applied.  A timeout was performed. Sterile prep, hand hygiene and sterile gloves were used. A 155m 20ga BBraun echogenic stimulator needle was used. Negative aspiration and negative test dose prior to incremental administration of local anesthetic. The patient tolerated the procedure well. ? ?Ultrasound guidance: relevent anatomy identified, needle position confirmed, local anesthetic spread visualized around nerve(s), vascular puncture avoided.  Image printed for medical record.  ? ? ? ? ? ?

## 2021-06-17 NOTE — Anesthesia Procedure Notes (Signed)
Procedure Name: Intubation ?Date/Time: 06/17/2021 12:25 PM ?Performed by: Jenne Campus, CRNA ?Pre-anesthesia Checklist: Patient identified, Emergency Drugs available, Suction available and Patient being monitored ?Patient Re-evaluated:Patient Re-evaluated prior to induction ?Oxygen Delivery Method: Circle System Utilized ?Preoxygenation: Pre-oxygenation with 100% oxygen ?Induction Type: IV induction ?Ventilation: Mask ventilation without difficulty ?Laryngoscope Size: Sabra Heck and 2 ?Grade View: Grade I ?Tube type: Oral ?Tube size: 7.0 mm ?Number of attempts: 1 ?Airway Equipment and Method: Stylet ?Placement Confirmation: ETT inserted through vocal cords under direct vision, positive ETCO2 and breath sounds checked- equal and bilateral ?Secured at: 22 cm ?Tube secured with: Tape ?Dental Injury: Teeth and Oropharynx as per pre-operative assessment  ? ? ? ? ?

## 2021-06-17 NOTE — Interval H&P Note (Signed)
History and Physical Interval Note: ? ?06/17/2021 ?11:38 AM ? ?Joan Dalton  has presented today for surgery, with the diagnosis of LEFT BREAST DCIS.  The various methods of treatment have been discussed with the patient and family. After consideration of risks, benefits and other options for treatment, the patient has consented to  Procedure(s): ?LEFT BREAST LUMPECTOMY WITH RADIOACTIVE SEED AND SENTINEL LYMPH NODE BIOPSY (Left) as a surgical intervention.  The patient's history has been reviewed, patient examined, no change in status, stable for surgery.  I have reviewed the patient's chart and labs.  Questions were answered to the patient's satisfaction.   ? ? ?Autumn Messing III ? ? ?

## 2021-06-17 NOTE — Transfer of Care (Signed)
Immediate Anesthesia Transfer of Care Note ? ?Patient: Joan Dalton ? ?Procedure(s) Performed: LEFT BREAST LUMPECTOMY WITH RADIOACTIVE SEEDS x 2 (Left: Breast) ?LEFT AXILLARY SENTINEL NODE BIOPSY (Left: Breast) ? ?Patient Location: PACU ? ?Anesthesia Type:GA combined with regional for post-op pain ? ?Level of Consciousness: drowsy ? ?Airway & Oxygen Therapy: Patient Spontanous Breathing and Patient connected to face mask oxygen ? ?Post-op Assessment: Report given to RN and Post -op Vital signs reviewed and stable ? ?Post vital signs: Reviewed and stable ? ?Last Vitals:  ?BP: 156/90 ?HR: 105 ?SpO2: 100 ?RR: 15 ? ?Vitals Value Taken Time  ?BP    ?Temp    ?Pulse    ?Resp    ?SpO2    ? ? ?Last Pain:  ?Vitals:  ? 06/17/21 1013  ?TempSrc: Oral  ?   ? ?  ? ?Complications: No notable events documented. ?

## 2021-06-17 NOTE — Anesthesia Preprocedure Evaluation (Addendum)
Anesthesia Evaluation  ?Patient identified by MRN, date of birth, ID band ?Patient awake ? ? ? ?Reviewed: ?Allergy & Precautions, NPO status , Patient's Chart, lab work & pertinent test results ? ?Airway ?Mallampati: II ? ?TM Distance: >3 FB ?Neck ROM: Full ? ? ? Dental ?no notable dental hx. ? ?  ?Pulmonary ?neg pulmonary ROS,  ?  ?Pulmonary exam normal ?breath sounds clear to auscultation ? ? ? ? ? ? Cardiovascular ?negative cardio ROS ?Normal cardiovascular exam ?Rhythm:Regular Rate:Normal ? ? ?  ?Neuro/Psych ?negative neurological ROS ? negative psych ROS  ? GI/Hepatic ?negative GI ROS, Neg liver ROS,   ?Endo/Other  ?Morbid obesity ? Renal/GU ?negative Renal ROS  ? ?  ?Musculoskeletal ?negative musculoskeletal ROS ?(+)  ? Abdominal ?(+) + obese,   ?Peds ? Hematology ?negative hematology ROS ?(+)   ?Anesthesia Other Findings ?LEFT BREAST DCIS ? Reproductive/Obstetrics ? ?  ? ? ? ? ? ? ? ? ? ? ? ? ? ?  ?  ? ? ? ? ? ? ? ?Anesthesia Physical ?Anesthesia Plan ? ?ASA: 3 ? ?Anesthesia Plan: General  ? ?Post-op Pain Management:   ? ?Induction: Intravenous ? ?PONV Risk Score and Plan: 3 and Ondansetron, Dexamethasone, Midazolam and Treatment may vary due to age or medical condition ? ?Airway Management Planned: LMA ? ?Additional Equipment:  ? ?Intra-op Plan:  ? ?Post-operative Plan: Extubation in OR ? ?Informed Consent: I have reviewed the patients History and Physical, chart, labs and discussed the procedure including the risks, benefits and alternatives for the proposed anesthesia with the patient or authorized representative who has indicated his/her understanding and acceptance.  ? ? ? ?Dental advisory given ? ?Plan Discussed with: CRNA ? ?Anesthesia Plan Comments:   ? ? ? ? ? ? ?Anesthesia Quick Evaluation ? ?

## 2021-06-18 ENCOUNTER — Encounter (HOSPITAL_COMMUNITY): Payer: Self-pay | Admitting: General Surgery

## 2021-06-18 NOTE — Anesthesia Postprocedure Evaluation (Signed)
Anesthesia Post Note ? ?Patient: Joan Dalton ? ?Procedure(s) Performed: LEFT BREAST LUMPECTOMY WITH RADIOACTIVE SEEDS x 2 (Left: Breast) ?LEFT AXILLARY SENTINEL NODE BIOPSY (Left: Breast) ? ?  ? ?Patient location during evaluation: PACU ?Anesthesia Type: General and Regional ?Level of consciousness: awake ?Pain management: pain level controlled ?Vital Signs Assessment: post-procedure vital signs reviewed and stable ?Respiratory status: spontaneous breathing, nonlabored ventilation, respiratory function stable and patient connected to nasal cannula oxygen ?Cardiovascular status: blood pressure returned to baseline and stable ?Postop Assessment: no apparent nausea or vomiting ?Anesthetic complications: no ? ? ?No notable events documented. ? ?Last Vitals:  ?Vitals:  ? 06/17/21 1410 06/17/21 1425  ?BP: (!) 139/91 (!) 149/93  ?Pulse: 89 83  ?Resp: 13 12  ?Temp:  36.9 ?C  ?SpO2: 100% 95%  ?  ?Last Pain:  ?Vitals:  ? 06/17/21 1425  ?TempSrc:   ?PainSc: 0-No pain  ? ? ?  ?  ?  ?  ?  ?  ? ?Malachi Suderman P Marquie Aderhold ? ? ? ? ?

## 2021-06-21 ENCOUNTER — Encounter: Payer: Self-pay | Admitting: *Deleted

## 2021-06-21 LAB — SURGICAL PATHOLOGY

## 2021-06-25 ENCOUNTER — Encounter: Payer: Self-pay | Admitting: *Deleted

## 2021-06-26 ENCOUNTER — Encounter (HOSPITAL_COMMUNITY): Payer: Self-pay

## 2021-07-08 ENCOUNTER — Ambulatory Visit (INDEPENDENT_AMBULATORY_CARE_PROVIDER_SITE_OTHER): Payer: BC Managed Care – PPO | Admitting: Plastic Surgery

## 2021-07-08 ENCOUNTER — Encounter: Payer: Self-pay | Admitting: Plastic Surgery

## 2021-07-08 DIAGNOSIS — D0512 Intraductal carcinoma in situ of left breast: Secondary | ICD-10-CM

## 2021-07-08 DIAGNOSIS — N6489 Other specified disorders of breast: Secondary | ICD-10-CM | POA: Diagnosis not present

## 2021-07-08 NOTE — Progress Notes (Signed)
? ?  Subjective:  ? ? Patient ID: Joan Dalton, female    DOB: 25-Jun-1975, 46 y.o.   MRN: [id number] ? ?The patient is a 46 year old female joining me by phone.  She was diagnosed with left breast cancer.  She decided on a partial mastectomy and staging it to be sure that the pathology was good to come back negative.  It was ductal carcinoma in situ in the upper outer quadrant.  She had the surgery done by Dr. Marlou Starks on 3/27.  She is recovering nicely.  She has her follow-up with him tomorrow.  She would like to move ahead with scheduling the mastopexy reduction. ? ? ? ?Review of Systems  ?Constitutional: Negative.   ?Eyes: Negative.   ?Respiratory: Negative.    ?Cardiovascular: Negative.   ?Gastrointestinal: Negative.   ?Endocrine: Negative.   ?Genitourinary: Negative.   ?Musculoskeletal: Negative.   ?Skin: Negative.   ? ?   ?Objective:  ? Physical Exam ? ? ?   ?Assessment & Plan:  ? ?  ICD-10-CM   ?1. Ductal carcinoma in situ (DCIS) of left breast  D05.12   ?  ?2. Postoperative breast asymmetry  N64.89   ?  ?  ? ?I connected with  Ashara Karl Ito on 07/08/21 by phone and verified that I am speaking with the correct person using two identifiers.  The patient was at home and I was at the office.  I spent 15 minutes in the following manner: Review of chart, discussion with patient, discussion of procedure and documentation.  I also requested the surgery to be scheduled. ?  ?I discussed the limitations of evaluation and management by telemedicine. The patient expressed understanding and agreed to proceed. ? ?

## 2021-07-11 ENCOUNTER — Ambulatory Visit (INDEPENDENT_AMBULATORY_CARE_PROVIDER_SITE_OTHER): Payer: BC Managed Care – PPO | Admitting: Physician Assistant

## 2021-07-11 VITALS — BP 144/95 | HR 86 | Ht 66.0 in | Wt 266.5 lb

## 2021-07-11 DIAGNOSIS — D0512 Intraductal carcinoma in situ of left breast: Secondary | ICD-10-CM

## 2021-07-11 MED ORDER — ONDANSETRON 4 MG PO TBDP: 4.0000 mg | ORAL_TABLET | Freq: Three times a day (TID) | ORAL | 0 refills | Status: DC | PRN

## 2021-07-11 MED ORDER — OXYCODONE-ACETAMINOPHEN 5-325 MG PO TABS: 1.0000 | ORAL_TABLET | Freq: Four times a day (QID) | ORAL | 0 refills | Status: AC | PRN

## 2021-07-11 MED ORDER — CEPHALEXIN 500 MG PO CAPS: 500.0000 mg | ORAL_CAPSULE | Freq: Four times a day (QID) | ORAL | 0 refills | Status: AC

## 2021-07-11 NOTE — Progress Notes (Addendum)
? ?  Patient ID: Joan Dalton, female    DOB: July 04, 1975, 46 y.o.   MRN: [id number] ? ?Chief Complaint  ?Patient presents with  ? Pre-op Exam  ? ? ?  ICD-10-CM   ?1. Ductal carcinoma in situ (DCIS) of left breast  D05.12   ?  ? ? ? ?History of Present Illness: ?Joan Dalton is a 46 y.o.  female  with a history of left-sided DCIS s/p partial mastectomy performed 06/17/2021.  She presents for preoperative evaluation for upcoming procedure, bilateral oncoplastic reduction and mastopexy, scheduled for 07/24/2021 with Dr. Marla Roe. ? ?The patient has not had problems with anesthesia.  She tells me that she was admitted for 1 week in the hospital 2021 for pulmonary embolism and was on Eliquis for a full year before being discontinued by her hematologist.  She is no longer on any blood thinners.  She also endorses family history of thrombosis.  She does not smoke tobacco or drink alcohol.  She does endorse leg swelling and varicosities.  She does not have a port, but does state that she may ultimately require RT.  Patient tells me that she has always been a D cup and would like to remain a C or D.   ? ?Summary of Previous Visit: She was seen for consult on 07/08/2021.  She decided to await pathology before deciding on reconstruction.  She is recovering nicely and would like to move ahead with the mastopexy and reduction. ? 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  Discussed 2 weeks if needed for her part-time job. ? ?PMH Significant for: Left sided DCIS s/p partial mastectomy performed by Dr. Marlou Starks, pulmonary embolism. ? ? ?Past Medical History: ?Allergies: ?Allergies  ?Allergen Reactions  ? Sulfamethoxazole-Trimethoprim Dermatitis and Itching  ? ? ?Current Medications: ? ?Current Outpatient Medications:  ?  acetaminophen (TYLENOL) 500 MG tablet, Take 1,000 mg by mouth every 6 (six) hours as needed for moderate pain or headache., Disp: , Rfl:  ?  COD LIVER OIL PO, Take 5 mLs  by mouth 3 (three) times a week., Disp: , Rfl:  ?  ferrous sulfate 325 (65 FE) MG tablet, Take 325 mg by mouth daily in the afternoon., Disp: , Rfl:  ?  ibuprofen (ADVIL) 200 MG tablet, Take 400 mg by mouth every 6 (six) hours as needed for moderate pain., Disp: , Rfl:  ?  Multiple Vitamin (MULTI-VITAMIN) tablet, Take 1 tablet by mouth daily., Disp: , Rfl:  ?  oxyCODONE (ROXICODONE) 5 MG immediate release tablet, Take 1 tablet (5 mg total) by mouth every 6 (six) hours as needed for severe pain., Disp: 15 tablet, Rfl: 0 ?  VITAMIN D PO, Take 1 capsule by mouth daily., Disp: , Rfl:  ? ?Past Medical Problems: ?Past Medical History:  ?Diagnosis Date  ? Breast cancer ([facility]) 05/08/2021  ? Family history of breast cancer   ? Family history of colon cancer   ? Family history of pancreatic cancer   ? ? ?Past Surgical History: ?Past Surgical History:  ?Procedure Laterality Date  ? AXILLARY SENTINEL NODE BIOPSY Left 06/17/2021  ? Procedure: LEFT AXILLARY SENTINEL NODE BIOPSY;  Surgeon: Jovita Kussmaul, MD;  Location: [facility];  Service: General;  Laterality: Left;  ? BREAST BIOPSY Left 05/08/2021  ? BREAST LUMPECTOMY Right 10/1998  ? BREAST LUMPECTOMY WITH RADIOACTIVE SEED AND SENTINEL LYMPH NODE BIOPSY Left 06/17/2021  ? Procedure: LEFT BREAST LUMPECTOMY WITH RADIOACTIVE SEEDS x 2;  Surgeon: Autumn Messing III,  MD;  Location: [facility];  Service: General;  Laterality: Left;  ? ? ?Social History: ?Social History  ? ?Socioeconomic History  ? Marital status: Married  ?  Spouse name: NOBIE ALLEYNE II  ? Number of children: 5  ? Years of education: Not on file  ? Highest education level: Not on file  ?Occupational History  ? Not on file  ?Tobacco Use  ? Smoking status: Never  ? Smokeless tobacco: Never  ?Vaping Use  ? Vaping Use: Never used  ?Substance and Sexual Activity  ? Alcohol use: Never  ? Drug use: Never  ? Sexual activity: Yes  ?Other Topics Concern  ? Not on file  ?Social History Narrative  ? Not on file  ? ?Social Determinants of  Health  ? ?Financial Resource Strain: Not on file  ?Food Insecurity: Not on file  ?Transportation Needs: Not on file  ?Physical Activity: Not on file  ?Stress: Not on file  ?Social Connections: Not on file  ?Intimate Partner Violence: Not At Risk  ? Fear of Current or Ex-Partner: No  ? Emotionally Abused: No  ? Physically Abused: No  ? Sexually Abused: No  ? ? ?Family History: ?Family History  ?Problem Relation Age of Onset  ? Breast cancer Mother   ?     92s & 45s  ? Prostate cancer Father   ? Breast cancer Maternal Aunt 75  ? Pancreatic cancer Maternal Aunt 47  ? Breast cancer Maternal Aunt   ?     later 30s-early 65s  ? Colon cancer Maternal Aunt 14  ? Colon cancer Maternal Uncle   ?     71s  ? Colon cancer Maternal Grandfather   ? Pancreatic cancer Cousin   ?     mother's maternal first cousin  ? Pancreatic cancer Cousin   ?     mother's maternal first cousin  ? ? ?Review of Systems: ?ROS ?Denies recent chest pain, difficulty breathing, fevers, infections, or traumas. ? ?Physical Exam: ?Vital Signs ?BP (!) 144/95 (BP Location: Right Arm, Patient Position: Sitting, Cuff Size: Large)   Pulse 86   Ht '5\' 6"'$  (1.676 m)   Wt 266 lb 8 oz (120.9 kg)   SpO2 99%   BMI 43.01 kg/m?  ? ?Physical Exam ?Constitutional:   ?   General: Not in acute distress. ?   Appearance: Normal appearance. Not ill-appearing.  ?HENT:  ?   Head: Normocephalic and atraumatic.  ?Eyes:  ?   Pupils: Pupils are equal, round. ?Cardiovascular:  ?   Rate and Rhythm: Normal rate. ?   Pulses: Normal pulses.  ?Pulmonary:  ?   Effort: No respiratory distress or increased work of breathing.  Speaks in full sentences. ?Abdominal:  ?   General: Abdomen is flat. No distension.   ?Musculoskeletal: Normal range of motion.  1+ lower extremity swelling. ?Skin: ?   General: Skin is warm and dry.  ?   Findings: No erythema or rash.  ?Neurological:  ?   Mental Status: Alert and oriented to person, place, and time.  ?Psychiatric:     ?   Mood and Affect: Mood  normal.     ?   Behavior: Behavior normal.  ? ? ?Assessment/Plan: ?The patient is scheduled for bilateral oncoplastic reduction and mastopexy with Dr. Marla Roe.  Risks, benefits, and alternatives of procedure discussed, questions answered and consent obtained.   ? ?Smoking Status: Non-smoker ?Last Mammogram: 03/2021; Results: Right side: Negative.  Left side, calcifications ultimately found  to be DCIS. ? ?Caprini Score: 15; Risk Factors include: Age, BMI greater than 41, personal history of pulmonary embolism, family history of thrombosis, personal history of breast cancer, leg swelling, varicosities, and length of planned surgery. Recommendation for mechanical and pharmacological prophylaxis.  We will discuss pharmacologic prophylaxis with Dr. Marla Roe.  Encourage early ambulation.  ?Given her risk factors, will proceed with prescribing 7 days post-operative Lovenox for pharmacologic prophylaxis.   ? ?Pictures obtained: 05/21/2021 ? ?Post-op Rx sent to pharmacy: Oxycodone, Zofran, Keflex, Lovenox. ? ? ?Patient was provided with the General Surgical Risk consent document and Pain Medication Agreement prior to their appointment.  They had adequate time to read through the risk consent documents and Pain Medication Agreement. We also discussed them in person together during this preop appointment. All of their questions were answered to their satisfaction.  Recommended calling if they have any further questions.  Risk consent form and Pain Medication Agreement to be scanned into patient's chart. ? ?The risk that can be encountered with mastopexy/breast lift were discussed and include the following but not limited to these:  Breast asymmetry, fluid accumulation, firmness of the breast, inability to breast feed, loss of nipple or areola, skin loss, decrease or no nipple sensation, fat necrosis of the breast tissue, bleeding, infection, healing delay.  There are risks of anesthesia, changes to skin sensation and injury  to nerves or blood vessels.  The muscle can be temporarily or permanently injured.  You may have an allergic reaction to tape, suture, glue, blood products which can result in skin discoloration, swelling, pain,

## 2021-07-11 NOTE — H&P (View-Only) (Signed)
? ?  Patient ID: Joan Dalton, female    DOB: [birth year], 46 y.o.   MRN: [id number] ? ?Chief Complaint  ?Patient presents with  ? Pre-op Exam  ? ? ?  ICD-10-CM   ?1. Ductal carcinoma in situ (DCIS) of left breast  D05.12   ?  ? ? ? ?History of Present Illness: ?Joan Dalton is a 46 y.o.  female  with a history of left-sided DCIS s/p partial mastectomy performed 06/17/2021.  She presents for preoperative evaluation for upcoming procedure, bilateral oncoplastic reduction and mastopexy, scheduled for 07/24/2021 with Dr. Marla Roe. ? ?The patient has not had problems with anesthesia.  She tells me that she was admitted for 1 week in the hospital 2021 for pulmonary embolism and was on Eliquis for a full year before being discontinued by her hematologist.  She is no longer on any blood thinners.  She also endorses family history of thrombosis.  She does not smoke tobacco or drink alcohol.  She does endorse leg swelling and varicosities.  She does not have a port, but does state that she may ultimately require RT.  Patient tells me that she has always been a D cup and would like to remain a C or D.   ? ?Summary of Previous Visit: She was seen for consult on 07/08/2021.  She decided to await pathology before deciding on reconstruction.  She is recovering nicely and would like to move ahead with the mastopexy and reduction. ? 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  Discussed 2 weeks if needed for her part-time job. ? ?PMH Significant for: Left sided DCIS s/p partial mastectomy performed by Dr. Marlou Starks, pulmonary embolism. ? ? ?Past Medical History: ?Allergies: ?Allergies  ?Allergen Reactions  ? Sulfamethoxazole-Trimethoprim Dermatitis and Itching  ? ? ?Current Medications: ? ?Current Outpatient Medications:  ?  acetaminophen (TYLENOL) 500 MG tablet, Take 1,000 mg by mouth every 6 (six) hours as needed for moderate pain or headache., Disp: , Rfl:  ?  COD LIVER OIL PO, Take 5 mLs  by mouth 3 (three) times a week., Disp: , Rfl:  ?  ferrous sulfate 325 (65 FE) MG tablet, Take 325 mg by mouth daily in the afternoon., Disp: , Rfl:  ?  ibuprofen (ADVIL) 200 MG tablet, Take 400 mg by mouth every 6 (six) hours as needed for moderate pain., Disp: , Rfl:  ?  Multiple Vitamin (MULTI-VITAMIN) tablet, Take 1 tablet by mouth daily., Disp: , Rfl:  ?  oxyCODONE (ROXICODONE) 5 MG immediate release tablet, Take 1 tablet (5 mg total) by mouth every 6 (six) hours as needed for severe pain., Disp: 15 tablet, Rfl: 0 ?  VITAMIN D PO, Take 1 capsule by mouth daily., Disp: , Rfl:  ? ?Past Medical Problems: ?Past Medical History:  ?Diagnosis Date  ? Breast cancer ([facility]) 05/08/2021  ? Family history of breast cancer   ? Family history of colon cancer   ? Family history of pancreatic cancer   ? ? ?Past Surgical History: ?Past Surgical History:  ?Procedure Laterality Date  ? AXILLARY SENTINEL NODE BIOPSY Left 06/17/2021  ? Procedure: LEFT AXILLARY SENTINEL NODE BIOPSY;  Surgeon: Jovita Kussmaul, MD;  Location: [facility];  Service: General;  Laterality: Left;  ? BREAST BIOPSY Left 05/08/2021  ? BREAST LUMPECTOMY Right 10/1998  ? BREAST LUMPECTOMY WITH RADIOACTIVE SEED AND SENTINEL LYMPH NODE BIOPSY Left 06/17/2021  ? Procedure: LEFT BREAST LUMPECTOMY WITH RADIOACTIVE SEEDS x 2;  Surgeon: Autumn Messing III,  MD;  Location: [facility];  Service: General;  Laterality: Left;  ? ? ?Social History: ?Social History  ? ?Socioeconomic History  ? Marital status: Married  ?  Spouse name: Joan Dalton  ? Number of children: 5  ? Years of education: Not on file  ? Highest education level: Not on file  ?Occupational History  ? Not on file  ?Tobacco Use  ? Smoking status: Never  ? Smokeless tobacco: Never  ?Vaping Use  ? Vaping Use: Never used  ?Substance and Sexual Activity  ? Alcohol use: Never  ? Drug use: Never  ? Sexual activity: Yes  ?Other Topics Concern  ? Not on file  ?Social History Narrative  ? Not on file  ? ?Social Determinants of  Health  ? ?Financial Resource Strain: Not on file  ?Food Insecurity: Not on file  ?Transportation Needs: Not on file  ?Physical Activity: Not on file  ?Stress: Not on file  ?Social Connections: Not on file  ?Intimate Partner Violence: Not At Risk  ? Fear of Current or Ex-Partner: No  ? Emotionally Abused: No  ? Physically Abused: No  ? Sexually Abused: No  ? ? ?Family History: ?Family History  ?Problem Relation Age of Onset  ? Breast cancer Mother   ?     43s & 19s  ? Prostate cancer Father   ? Breast cancer Maternal Aunt 75  ? Pancreatic cancer Maternal Aunt 41  ? Breast cancer Maternal Aunt   ?     later 30s-early 22s  ? Colon cancer Maternal Aunt 63  ? Colon cancer Maternal Uncle   ?     55s  ? Colon cancer Maternal Grandfather   ? Pancreatic cancer Cousin   ?     mother's maternal first cousin  ? Pancreatic cancer Cousin   ?     mother's maternal first cousin  ? ? ?Review of Systems: ?ROS ?Denies recent chest pain, difficulty breathing, fevers, infections, or traumas. ? ?Physical Exam: ?Vital Signs ?BP (!) 144/95 (BP Location: Right Arm, Patient Position: Sitting, Cuff Size: Large)   Pulse 86   Ht '5\' 6"'$  (1.676 m)   Wt 266 lb 8 oz (120.9 kg)   SpO2 99%   BMI 43.01 kg/m?  ? ?Physical Exam ?Constitutional:   ?   General: Not in acute distress. ?   Appearance: Normal appearance. Not ill-appearing.  ?HENT:  ?   Head: Normocephalic and atraumatic.  ?Eyes:  ?   Pupils: Pupils are equal, round. ?Cardiovascular:  ?   Rate and Rhythm: Normal rate. ?   Pulses: Normal pulses.  ?Pulmonary:  ?   Effort: No respiratory distress or increased work of breathing.  Speaks in full sentences. ?Abdominal:  ?   General: Abdomen is flat. No distension.   ?Musculoskeletal: Normal range of motion.  1+ lower extremity swelling. ?Skin: ?   General: Skin is warm and dry.  ?   Findings: No erythema or rash.  ?Neurological:  ?   Mental Status: Alert and oriented to person, place, and time.  ?Psychiatric:     ?   Mood and Affect: Mood  normal.     ?   Behavior: Behavior normal.  ? ? ?Assessment/Plan: ?The patient is scheduled for bilateral oncoplastic reduction and mastopexy with Dr. Marla Roe.  Risks, benefits, and alternatives of procedure discussed, questions answered and consent obtained.   ? ?Smoking Status: Non-smoker ?Last Mammogram: 03/2021; Results: Right side: Negative.  Left side, calcifications ultimately found  to be DCIS. ? ?Caprini Score: 15; Risk Factors include: Age, BMI greater than 70, personal history of pulmonary embolism, family history of thrombosis, personal history of breast cancer, leg swelling, varicosities, and length of planned surgery. Recommendation for mechanical and pharmacological prophylaxis.  We will discuss pharmacologic prophylaxis with Dr. Marla Roe.  Encourage early ambulation.  ?Given her risk factors, will proceed with prescribing 7 days post-operative Lovenox for pharmacologic prophylaxis.   ? ?Pictures obtained: 05/21/2021 ? ?Post-op Rx sent to pharmacy: Oxycodone, Zofran, Keflex, Lovenox. ? ? ?Patient was provided with the General Surgical Risk consent document and Pain Medication Agreement prior to their appointment.  They had adequate time to read through the risk consent documents and Pain Medication Agreement. We also discussed them in person together during this preop appointment. All of their questions were answered to their satisfaction.  Recommended calling if they have any further questions.  Risk consent form and Pain Medication Agreement to be scanned into patient's chart. ? ?The risk that can be encountered with mastopexy/breast lift were discussed and include the following but not limited to these:  Breast asymmetry, fluid accumulation, firmness of the breast, inability to breast feed, loss of nipple or areola, skin loss, decrease or no nipple sensation, fat necrosis of the breast tissue, bleeding, infection, healing delay.  There are risks of anesthesia, changes to skin sensation and injury  to nerves or blood vessels.  The muscle can be temporarily or permanently injured.  You may have an allergic reaction to tape, suture, glue, blood products which can result in skin discoloration, swelling, pain,

## 2021-07-15 ENCOUNTER — Telehealth: Payer: Self-pay | Admitting: Radiation Oncology

## 2021-07-15 MED ORDER — ENOXAPARIN SODIUM 40 MG/0.4ML IJ SOSY: 40.0000 mg | PREFILLED_SYRINGE | INTRAMUSCULAR | 0 refills | Status: DC

## 2021-07-15 NOTE — Addendum Note (Signed)
Addended by: Krista Blue on: 07/15/2021 12:01 PM ? ? Modules accepted: Orders ? ?

## 2021-07-15 NOTE — Telephone Encounter (Signed)
LVM to r/s 4/26 CON to 4/27 if possible for pt. Change to provider's schedule requires move to appt. ?

## 2021-07-17 ENCOUNTER — Ambulatory Visit: Payer: BC Managed Care – PPO | Admitting: Radiation Oncology

## 2021-07-17 ENCOUNTER — Ambulatory Visit: Payer: BC Managed Care – PPO

## 2021-07-19 NOTE — Pre-Procedure Instructions (Signed)
Surgical Instructions ? ? ? Your procedure is scheduled on Wednesday 07/24/21. ? ? Report to [facility] Main Entrance "A" at 09:00 A.M., then check in with the Admitting office. ? Call this number if you have problems the morning of surgery: ? [phone] ? ? If you have any questions prior to your surgery date call [phone]: Open Monday-Friday 8am-4pm ? ? ? Remember: ? Do not eat or drink after midnight the night before your surgery ?  ? Take these medicines the morning of surgery with A SIP OF WATER:  ? acetaminophen (TYLENOL)- If needed ? oxyCODONE (ROXICODONE)- If needed ? ondansetron (ZOFRAN-ODT)- If needed ? ?Please follow your surgeon's instructions regarding enoxaparin (LOVENOX) if you are still taking the medication. If you have not received instructions then please contact your surgeon's office for instructions.  ? ?As of today, STOP taking any Aspirin (unless otherwise instructed by your surgeon) Aleve, Naproxen, Ibuprofen, Motrin, Advil, Goody's, BC's, all herbal medications, fish oil, and all vitamins. ? ?         ?Do not wear jewelry or makeup ?Do not wear lotions, powders, perfumes/colognes, or deodorant. ?Do not shave 48 hours prior to surgery.  Men may shave face and neck. ?Do not bring valuables to the hospital. ?Do not wear nail polish, gel polish, artificial nails, or any other type of covering on natural nails (fingers and toes) ?If you have artificial nails or gel coating that need to be removed by a nail salon, please have this removed prior to surgery. Artificial nails or gel coating may interfere with anesthesia's ability to adequately monitor your vital signs. ? ?[facility] is not responsible for any belongings or valuables. .  ? ?Do NOT Smoke (Tobacco/Vaping)  24 hours prior to your procedure ? ?If you use a CPAP at night, you may bring your mask for your overnight stay. ?  ?Contacts, glasses, hearing aids, dentures or partials may not be worn into surgery, please bring cases for these  belongings ?  ?For patients admitted to the hospital, discharge time will be determined by your treatment team. ?  ?Patients discharged the day of surgery will not be allowed to drive home, and someone needs to stay with them for 24 hours. ? ? ?SURGICAL WAITING ROOM VISITATION ?Patients having surgery or a procedure in a hospital may have two support people. ?Children under the age of 4 must have an adult with them who is not the patient. ?They may stay in the waiting area during the procedure and may switch out with other visitors. If the patient needs to stay at the hospital during part of their recovery, the visitor guidelines for inpatient rooms apply. ? ?Please refer to the [facility] website for the visitor guidelines for Inpatients (after your surgery is over and you are in a regular room).  ? ? ? ? ? ?Special instructions:   ? ?Oral Hygiene is also important to reduce your risk of infection.  Remember - BRUSH YOUR TEETH THE MORNING OF SURGERY WITH YOUR REGULAR TOOTHPASTE ? ? ?- Preparing For Surgery ? ?Before surgery, you can play an important role. Because skin is not sterile, your skin needs to be as free of germs as possible. You can reduce the number of germs on your skin by washing with CHG (chlorahexidine gluconate) Soap before surgery.  CHG is an antiseptic cleaner which kills germs and bonds with the skin to continue killing germs even after washing.   ? ? ?Please do not use if  you have an allergy to CHG or antibacterial soaps. If your skin becomes reddened/irritated stop using the CHG.  ?Do not shave (including legs and underarms) for at least 48 hours prior to first CHG shower. It is OK to shave your face. ? ?Please follow these instructions carefully. ?  ? ? Shower the NIGHT BEFORE SURGERY and the MORNING OF SURGERY with CHG Soap.  ? If you chose to wash your hair, wash your hair first as usual with your normal shampoo. After you shampoo, rinse your hair and body thoroughly to remove  the shampoo.  Then [employer] and genitals (private parts) with your normal soap and rinse thoroughly to remove soap. ? ?After that Use CHG Soap as you would any other liquid soap. You can apply CHG directly to the skin and wash gently with a scrungie or a clean washcloth.  ? ?Apply the CHG Soap to your body ONLY FROM THE NECK DOWN.  Do not use on open wounds or open sores. Avoid contact with your eyes, ears, mouth and genitals (private parts). Wash Face and genitals (private parts)  with your normal soap.  ? ?Wash thoroughly, paying special attention to the area where your surgery will be performed. ? ?Thoroughly rinse your body with warm water from the neck down. ? ?DO NOT shower/wash with your normal soap after using and rinsing off the CHG Soap. ? ?Pat yourself dry with a CLEAN TOWEL. ? ?Wear CLEAN PAJAMAS to bed the night before surgery ? ?Place CLEAN SHEETS on your bed the night before your surgery ? ?DO NOT SLEEP WITH PETS. ? ? ?Day of Surgery: ? ?Take a shower with CHG soap. ?Wear Clean/Comfortable clothing the morning of surgery ?Do not apply any deodorants/lotions.   ?Remember to brush your teeth WITH YOUR REGULAR TOOTHPASTE. ? ? ? ?If you received a COVID test during your pre-op visit, it is requested that you wear a mask when out in public, stay away from anyone that may not be feeling well, and notify your surgeon if you develop symptoms. If you have been in contact with anyone that has tested positive in the last 10 days, please notify your surgeon. ? ?  ?Please read over the following fact sheets that you were given.  ? ?

## 2021-07-22 ENCOUNTER — Encounter (HOSPITAL_COMMUNITY)
Admission: RE | Admit: 2021-07-22 | Discharge: 2021-07-22 | Disposition: A | Discharge status: Home / Self Care | Payer: BC Managed Care – PPO | Source: Ambulatory Visit | Attending: Plastic Surgery | Admitting: Plastic Surgery

## 2021-07-22 ENCOUNTER — Encounter (HOSPITAL_COMMUNITY): Payer: Self-pay

## 2021-07-22 ENCOUNTER — Other Ambulatory Visit: Payer: Self-pay

## 2021-07-22 VITALS — BP 142/97 | HR 87 | Temp 98.2°F | Resp 18 | Ht 66.0 in | Wt 265.1 lb

## 2021-07-22 DIAGNOSIS — Z01812 Encounter for preprocedural laboratory examination: Secondary | ICD-10-CM | POA: Insufficient documentation

## 2021-07-22 DIAGNOSIS — N6081 Other benign mammary dysplasias of right breast: Secondary | ICD-10-CM | POA: Diagnosis not present

## 2021-07-22 DIAGNOSIS — Z6841 Body Mass Index (BMI) 40.0 and over, adult: Secondary | ICD-10-CM | POA: Diagnosis not present

## 2021-07-22 DIAGNOSIS — Z01818 Encounter for other preprocedural examination: Secondary | ICD-10-CM

## 2021-07-22 DIAGNOSIS — Z9012 Acquired absence of left breast and nipple: Secondary | ICD-10-CM | POA: Diagnosis not present

## 2021-07-22 DIAGNOSIS — N6489 Other specified disorders of breast: Secondary | ICD-10-CM | POA: Diagnosis present

## 2021-07-22 DIAGNOSIS — N62 Hypertrophy of breast: Secondary | ICD-10-CM | POA: Diagnosis not present

## 2021-07-22 DIAGNOSIS — Z86718 Personal history of other venous thrombosis and embolism: Secondary | ICD-10-CM | POA: Diagnosis not present

## 2021-07-22 DIAGNOSIS — Z853 Personal history of malignant neoplasm of breast: Secondary | ICD-10-CM | POA: Diagnosis not present

## 2021-07-22 LAB — CBC
HCT: 36 % (ref 36.0–46.0)
Hemoglobin: 11.9 g/dL — ABNORMAL LOW (ref 12.0–15.0)
MCH: 31.4 pg (ref 26.0–34.0)
MCHC: 33.1 g/dL (ref 30.0–36.0)
MCV: 95 fL (ref 80.0–100.0)
Platelets: 237 10*3/uL (ref 150–400)
RBC: 3.79 MIL/uL — ABNORMAL LOW (ref 3.87–5.11)
RDW: 13.4 % (ref 11.5–15.5)
WBC: 6.1 10*3/uL (ref 4.0–10.5)
nRBC: 0 % (ref 0.0–0.2)

## 2021-07-22 NOTE — Progress Notes (Signed)
PCP:  Joan Osmond, MD ?Cardiologist:  denies ? ?EKG:  na ?CXR:  na ?ECHO:  denies ?Stress Test:  denies ?Cardiac Cath:  denies ? ?Fasting Blood Sugar-  na ?Checks Blood Sugar__na_ times a day ? ?ASA/Blood Thinner: No.  Patient states Lovenox is for after surgery.  ? ?OSA/CPAP: No ? ?Covid test not needed ? ?Anesthesia Review: No ? ?Patient denies shortness of breath, fever, cough, and chest pain at PAT appointment. ? ?Patient verbalized understanding of instructions provided today at the PAT appointment.  Patient asked to review instructions at home and day of surgery.   ?

## 2021-07-24 ENCOUNTER — Encounter (HOSPITAL_COMMUNITY)
Admission: RE | Disposition: A | Discharge status: Home / Self Care | Payer: Self-pay | Source: Ambulatory Visit | Attending: Plastic Surgery

## 2021-07-24 ENCOUNTER — Encounter (HOSPITAL_COMMUNITY): Payer: Self-pay | Admitting: Plastic Surgery

## 2021-07-24 ENCOUNTER — Ambulatory Visit (HOSPITAL_COMMUNITY): Payer: BC Managed Care – PPO | Admitting: Certified Registered"

## 2021-07-24 ENCOUNTER — Other Ambulatory Visit: Payer: Self-pay

## 2021-07-24 ENCOUNTER — Ambulatory Visit (HOSPITAL_COMMUNITY)
Admission: RE | Admit: 2021-07-24 | Discharge: 2021-07-24 | Disposition: A | Discharge status: Home / Self Care | Payer: BC Managed Care – PPO | Source: Ambulatory Visit | Attending: Plastic Surgery | Admitting: Plastic Surgery

## 2021-07-24 ENCOUNTER — Encounter: Payer: Self-pay | Admitting: *Deleted

## 2021-07-24 DIAGNOSIS — D0512 Intraductal carcinoma in situ of left breast: Secondary | ICD-10-CM | POA: Diagnosis not present

## 2021-07-24 DIAGNOSIS — Z853 Personal history of malignant neoplasm of breast: Secondary | ICD-10-CM | POA: Insufficient documentation

## 2021-07-24 DIAGNOSIS — N6489 Other specified disorders of breast: Secondary | ICD-10-CM | POA: Insufficient documentation

## 2021-07-24 DIAGNOSIS — Z86718 Personal history of other venous thrombosis and embolism: Secondary | ICD-10-CM | POA: Insufficient documentation

## 2021-07-24 DIAGNOSIS — Z6841 Body Mass Index (BMI) 40.0 and over, adult: Secondary | ICD-10-CM | POA: Insufficient documentation

## 2021-07-24 DIAGNOSIS — N62 Hypertrophy of breast: Secondary | ICD-10-CM | POA: Insufficient documentation

## 2021-07-24 DIAGNOSIS — N651 Disproportion of reconstructed breast: Secondary | ICD-10-CM | POA: Diagnosis not present

## 2021-07-24 DIAGNOSIS — Z9012 Acquired absence of left breast and nipple: Secondary | ICD-10-CM | POA: Diagnosis not present

## 2021-07-24 DIAGNOSIS — N6081 Other benign mammary dysplasias of right breast: Secondary | ICD-10-CM | POA: Insufficient documentation

## 2021-07-24 HISTORY — PX: BREAST REDUCTION SURGERY: SHX8

## 2021-07-24 HISTORY — PX: MASTOPEXY: SHX5358

## 2021-07-24 LAB — POCT PREGNANCY, URINE: Preg Test, Ur: NEGATIVE

## 2021-07-24 SURGERY — MAMMOPLASTY, REDUCTION
Anesthesia: General | Site: Breast | Laterality: Bilateral

## 2021-07-24 MED ORDER — ACETAMINOPHEN 10 MG/ML IV SOLN
INTRAVENOUS | Status: AC
  Filled 2021-07-24: qty 100

## 2021-07-24 MED ORDER — ORAL CARE MOUTH RINSE: 15.0000 mL | Freq: Once | OROMUCOSAL | Status: AC

## 2021-07-24 MED ORDER — FENTANYL CITRATE (PF) 100 MCG/2ML IJ SOLN
INTRAMUSCULAR | Status: AC
  Filled 2021-07-24: qty 2

## 2021-07-24 MED ORDER — 0.9 % SODIUM CHLORIDE (POUR BTL) OPTIME
TOPICAL | Status: DC | PRN
  Administered 2021-07-24: 1000 mL

## 2021-07-24 MED ORDER — LIDOCAINE HCL 1 % IJ SOLN
INTRAMUSCULAR | Status: AC
  Filled 2021-07-24: qty 20

## 2021-07-24 MED ORDER — FENTANYL CITRATE (PF) 250 MCG/5ML IJ SOLN
INTRAMUSCULAR | Status: DC | PRN
  Administered 2021-07-24 (×2): 50 ug via INTRAVENOUS
  Administered 2021-07-24: 150 ug via INTRAVENOUS
  Administered 2021-07-24 (×2): 50 ug via INTRAVENOUS

## 2021-07-24 MED ORDER — HYDROMORPHONE HCL 1 MG/ML IJ SOLN
INTRAMUSCULAR | Status: AC
  Filled 2021-07-24: qty 0.5

## 2021-07-24 MED ORDER — CHLORHEXIDINE GLUCONATE CLOTH 2 % EX PADS: 6.0000 | MEDICATED_PAD | Freq: Once | CUTANEOUS | Status: DC

## 2021-07-24 MED ORDER — SODIUM CHLORIDE 0.9% FLUSH: 3.0000 mL | Freq: Two times a day (BID) | INTRAVENOUS | Status: DC

## 2021-07-24 MED ORDER — DEXAMETHASONE SODIUM PHOSPHATE 10 MG/ML IJ SOLN
INTRAMUSCULAR | Status: DC | PRN
  Administered 2021-07-24: 10 mg via INTRAVENOUS

## 2021-07-24 MED ORDER — CHLORHEXIDINE GLUCONATE 0.12 % MT SOLN
15.0000 mL | Freq: Once | OROMUCOSAL | Status: AC
  Administered 2021-07-24: 15 mL via OROMUCOSAL
  Filled 2021-07-24: qty 15

## 2021-07-24 MED ORDER — SODIUM CHLORIDE (PF) 0.9 % IJ SOLN
INTRAMUSCULAR | Status: AC
  Filled 2021-07-24: qty 50

## 2021-07-24 MED ORDER — ONDANSETRON HCL 4 MG/2ML IJ SOLN
INTRAMUSCULAR | Status: DC | PRN
  Administered 2021-07-24: 4 mg via INTRAVENOUS

## 2021-07-24 MED ORDER — LIDOCAINE HCL (PF) 1 % IJ SOLN
INTRAMUSCULAR | Status: DC | PRN
  Administered 2021-07-24: 20 mL via SURGICAL_CAVITY

## 2021-07-24 MED ORDER — MIDAZOLAM HCL 2 MG/2ML IJ SOLN
INTRAMUSCULAR | Status: AC
  Filled 2021-07-24: qty 2

## 2021-07-24 MED ORDER — PROPOFOL 10 MG/ML IV BOLUS
INTRAVENOUS | Status: DC | PRN
  Administered 2021-07-24: 200 mg via INTRAVENOUS

## 2021-07-24 MED ORDER — OXYCODONE HCL 5 MG PO TABS
ORAL_TABLET | ORAL | Status: AC
  Filled 2021-07-24: qty 1

## 2021-07-24 MED ORDER — PHENYLEPHRINE 80 MCG/ML (10ML) SYRINGE FOR IV PUSH (FOR BLOOD PRESSURE SUPPORT)
PREFILLED_SYRINGE | INTRAVENOUS | Status: DC | PRN
  Administered 2021-07-24 (×3): 80 ug via INTRAVENOUS

## 2021-07-24 MED ORDER — LIDOCAINE 2% (20 MG/ML) 5 ML SYRINGE
INTRAMUSCULAR | Status: DC | PRN
Start: 2021-07-24 — End: 2021-07-24
  Administered 2021-07-24: 60 mg via INTRAVENOUS

## 2021-07-24 MED ORDER — ACETAMINOPHEN 650 MG RE SUPP: 650.0000 mg | RECTAL | Status: DC | PRN

## 2021-07-24 MED ORDER — OXYCODONE HCL 5 MG PO TABS
5.0000 mg | ORAL_TABLET | Freq: Once | ORAL | Status: AC | PRN
  Administered 2021-07-24: 5 mg via ORAL

## 2021-07-24 MED ORDER — CEFAZOLIN IN SODIUM CHLORIDE 3-0.9 GM/100ML-% IV SOLN
3.0000 g | INTRAVENOUS | Status: AC
  Administered 2021-07-24: 3 g via INTRAVENOUS
  Filled 2021-07-24: qty 100

## 2021-07-24 MED ORDER — KETOROLAC TROMETHAMINE 30 MG/ML IJ SOLN
30.0000 mg | Freq: Once | INTRAMUSCULAR | Status: AC
Start: 2021-07-24 — End: 2021-07-24
  Administered 2021-07-24: 30 mg via INTRAVENOUS

## 2021-07-24 MED ORDER — FENTANYL CITRATE (PF) 100 MCG/2ML IJ SOLN
25.0000 ug | INTRAMUSCULAR | Status: DC | PRN
  Administered 2021-07-24: 50 ug via INTRAVENOUS

## 2021-07-24 MED ORDER — SODIUM CHLORIDE 0.9% FLUSH: 3.0000 mL | INTRAVENOUS | Status: DC | PRN

## 2021-07-24 MED ORDER — AMISULPRIDE (ANTIEMETIC) 5 MG/2ML IV SOLN: 10.0000 mg | Freq: Once | INTRAVENOUS | Status: DC | PRN

## 2021-07-24 MED ORDER — PROPOFOL 10 MG/ML IV BOLUS
INTRAVENOUS | Status: AC
  Filled 2021-07-24: qty 20

## 2021-07-24 MED ORDER — SODIUM CHLORIDE 0.9 % IV SOLN: 250.0000 mL | INTRAVENOUS | Status: DC | PRN

## 2021-07-24 MED ORDER — FENTANYL CITRATE (PF) 100 MCG/2ML IJ SOLN: 25.0000 ug | INTRAMUSCULAR | Status: DC | PRN

## 2021-07-24 MED ORDER — ACETAMINOPHEN 10 MG/ML IV SOLN
INTRAVENOUS | Status: DC | PRN
  Administered 2021-07-24: 1000 mg via INTRAVENOUS

## 2021-07-24 MED ORDER — ACETAMINOPHEN 325 MG PO TABS
650.0000 mg | ORAL_TABLET | ORAL | Status: DC | PRN
Start: 2021-07-24 — End: 2021-07-24

## 2021-07-24 MED ORDER — ACETAMINOPHEN 10 MG/ML IV SOLN: 1000.0000 mg | Freq: Once | INTRAVENOUS | Status: DC | PRN

## 2021-07-24 MED ORDER — MIDAZOLAM HCL 2 MG/2ML IJ SOLN
INTRAMUSCULAR | Status: DC | PRN
  Administered 2021-07-24: 2 mg via INTRAVENOUS

## 2021-07-24 MED ORDER — ROCURONIUM BROMIDE 10 MG/ML (PF) SYRINGE
PREFILLED_SYRINGE | INTRAVENOUS | Status: DC | PRN
  Administered 2021-07-24: 20 mg via INTRAVENOUS
  Administered 2021-07-24: 50 mg via INTRAVENOUS

## 2021-07-24 MED ORDER — KETOROLAC TROMETHAMINE 30 MG/ML IJ SOLN
INTRAMUSCULAR | Status: AC
  Filled 2021-07-24: qty 1

## 2021-07-24 MED ORDER — HYDROMORPHONE HCL 1 MG/ML IJ SOLN
INTRAMUSCULAR | Status: DC | PRN
  Administered 2021-07-24: .5 mg via INTRAVENOUS

## 2021-07-24 MED ORDER — SCOPOLAMINE 1 MG/3DAYS TD PT72
MEDICATED_PATCH | TRANSDERMAL | Status: DC | PRN
  Administered 2021-07-24: 1 via TRANSDERMAL

## 2021-07-24 MED ORDER — BUPIVACAINE-EPINEPHRINE 0.5% -1:200000 IJ SOLN
INTRAMUSCULAR | Status: AC
  Filled 2021-07-24: qty 1

## 2021-07-24 MED ORDER — FENTANYL CITRATE (PF) 250 MCG/5ML IJ SOLN
INTRAMUSCULAR | Status: AC
  Filled 2021-07-24: qty 5

## 2021-07-24 MED ORDER — BUPIVACAINE-EPINEPHRINE (PF) 0.25% -1:200000 IJ SOLN
INTRAMUSCULAR | Status: AC
  Filled 2021-07-24: qty 30

## 2021-07-24 MED ORDER — LACTATED RINGERS IV SOLN: INTRAVENOUS | Status: DC

## 2021-07-24 MED ORDER — SUGAMMADEX SODIUM 200 MG/2ML IV SOLN
INTRAVENOUS | Status: DC | PRN
  Administered 2021-07-24: 200 mg via INTRAVENOUS

## 2021-07-24 MED ORDER — OXYCODONE HCL 5 MG PO TABS: 5.0000 mg | ORAL_TABLET | ORAL | Status: DC | PRN

## 2021-07-24 MED ORDER — OXYCODONE HCL 5 MG/5ML PO SOLN: 5.0000 mg | Freq: Once | ORAL | Status: AC | PRN

## 2021-07-24 SURGICAL SUPPLY — 58 items
BINDER BREAST LRG (GAUZE/BANDAGES/DRESSINGS) ×1 IMPLANT
BINDER BREAST MEDIUM (GAUZE/BANDAGES/DRESSINGS) IMPLANT
BINDER BREAST XLRG (GAUZE/BANDAGES/DRESSINGS) IMPLANT
BINDER BREAST XXLRG (GAUZE/BANDAGES/DRESSINGS) ×1 IMPLANT
BIOPATCH RED 1 DISK 7.0 (GAUZE/BANDAGES/DRESSINGS) IMPLANT
BLADE SURG 10 STRL SS (BLADE) ×2 IMPLANT
BNDG GAUZE ELAST 4 BULKY (GAUZE/BANDAGES/DRESSINGS) IMPLANT
CANISTER SUCT 3000ML PPV (MISCELLANEOUS) ×2 IMPLANT
CHLORAPREP W/TINT 26 (MISCELLANEOUS) ×2 IMPLANT
COVER SURGICAL LIGHT HANDLE (MISCELLANEOUS) ×2 IMPLANT
DERMABOND ADVANCED (GAUZE/BANDAGES/DRESSINGS) ×2
DERMABOND ADVANCED .7 DNX12 (GAUZE/BANDAGES/DRESSINGS) ×2 IMPLANT
DRAIN CHANNEL 19F RND (DRAIN) ×1 IMPLANT
DRAPE HALF SHEET 40X57 (DRAPES) ×2 IMPLANT
DRAPE ORTHO SPLIT 77X108 STRL (DRAPES) ×2
DRAPE SURG ORHT 6 SPLT 77X108 (DRAPES) ×2 IMPLANT
DRSG PAD ABDOMINAL 8X10 ST (GAUZE/BANDAGES/DRESSINGS) ×4 IMPLANT
ELECT BLADE 4.0 EZ CLEAN MEGAD (MISCELLANEOUS) ×2
ELECT CAUTERY BLADE 6.4 (BLADE) ×2 IMPLANT
ELECT REM PT RETURN 9FT ADLT (ELECTROSURGICAL) ×2
ELECTRODE BLDE 4.0 EZ CLN MEGD (MISCELLANEOUS) ×1 IMPLANT
ELECTRODE REM PT RTRN 9FT ADLT (ELECTROSURGICAL) ×1 IMPLANT
EVACUATOR SILICONE 100CC (DRAIN) ×2 IMPLANT
GAUZE SPONGE 4X4 12PLY STRL (GAUZE/BANDAGES/DRESSINGS) ×1 IMPLANT
GLOVE BIO SURGEON STRL SZ 6.5 (GLOVE) ×4 IMPLANT
GLOVE SURG ENC TEXT LTX SZ7.5 (GLOVE) ×2 IMPLANT
GOWN STRL REUS W/ TWL LRG LVL3 (GOWN DISPOSABLE) ×2 IMPLANT
GOWN STRL REUS W/TWL LRG LVL3 (GOWN DISPOSABLE) ×2
KIT BASIN OR (CUSTOM PROCEDURE TRAY) ×2 IMPLANT
KIT TURNOVER KIT B (KITS) ×2 IMPLANT
MARKER SKIN DUAL TIP RULER LAB (MISCELLANEOUS) ×2 IMPLANT
NDL HYPO 25GX1X1/2 BEV (NEEDLE) ×1 IMPLANT
NEEDLE HYPO 25GX1X1/2 BEV (NEEDLE) ×2 IMPLANT
NS IRRIG 1000ML POUR BTL (IV SOLUTION) ×2 IMPLANT
PACK GENERAL/GYN (CUSTOM PROCEDURE TRAY) ×2 IMPLANT
PAD ARMBOARD 7.5X6 YLW CONV (MISCELLANEOUS) ×4 IMPLANT
SPONGE T-LAP 18X18 ~~LOC~~+RFID (SPONGE) ×2 IMPLANT
STAPLER VISISTAT 35W (STAPLE) ×2 IMPLANT
STRIP CLOSURE SKIN 1/2X4 (GAUZE/BANDAGES/DRESSINGS) ×4 IMPLANT
SUT MNCRL AB 3-0 PS2 18 (SUTURE) ×1 IMPLANT
SUT MNCRL AB 3-0 PS2 27 (SUTURE) ×3 IMPLANT
SUT MNCRL AB 4-0 PS2 18 (SUTURE) IMPLANT
SUT MON AB 3-0 SH 27 (SUTURE)
SUT MON AB 3-0 SH27 (SUTURE) IMPLANT
SUT MON AB 4-0 PS1 27 (SUTURE) ×5 IMPLANT
SUT MON AB 5-0 PS2 18 (SUTURE) IMPLANT
SUT PDS AB 2-0 CT1 27 (SUTURE) ×1 IMPLANT
SUT PDS AB 2-0 CT2 27 (SUTURE) ×2 IMPLANT
SUT PDS AB 3-0 SH 27 (SUTURE) IMPLANT
SUT SILK 3 0 PS 1 (SUTURE) IMPLANT
SUT VIC AB 2-0 SH 18 (SUTURE) IMPLANT
SUT VIC AB 3-0 SH 27 (SUTURE)
SUT VIC AB 3-0 SH 27X BRD (SUTURE) IMPLANT
SUT VICRYL 4-0 PS2 18IN ABS (SUTURE) IMPLANT
SYR 50ML LL SCALE MARK (SYRINGE) IMPLANT
SYR CONTROL 10ML LL (SYRINGE) ×2 IMPLANT
TOWEL GREEN STERILE (TOWEL DISPOSABLE) ×2 IMPLANT
TOWEL GREEN STERILE FF (TOWEL DISPOSABLE) ×4 IMPLANT

## 2021-07-24 NOTE — Anesthesia Preprocedure Evaluation (Signed)
Anesthesia Evaluation  ?Patient identified by MRN, date of birth, ID band ?Patient awake ? ? ? ?Reviewed: ?Allergy & Precautions, NPO status , Patient's Chart, lab work & pertinent test results ? ?Airway ?Mallampati: II ? ?TM Distance: >3 FB ?Neck ROM: Full ? ? ? Dental ?no notable dental hx. ? ?  ?Pulmonary ?PE ?  ?Pulmonary exam normal ? ? ? ? ? ? ? Cardiovascular ?+ DVT  ?Normal cardiovascular exam ? ? ?  ?Neuro/Psych ?negative neurological ROS ? negative psych ROS  ? GI/Hepatic ?negative GI ROS, Neg liver ROS,   ?Endo/Other  ?Morbid obesity ? Renal/GU ?negative Renal ROS  ? ?  ?Musculoskeletal ?negative musculoskeletal ROS ?(+)  ? Abdominal ?(+) + obese,   ?Peds ? Hematology ? ?(+) Blood dyscrasia, anemia ,   ?Anesthesia Other Findings ?LEFT BREAST DCIS ? Reproductive/Obstetrics ?hcg negative ? ?  ? ? ? ? ? ? ? ? ? ? ? ? ? ?  ?  ? ? ? ? ? ? ? ? ?Anesthesia Physical ? ?Anesthesia Plan ? ?ASA: 3 ? ?Anesthesia Plan: General  ? ?Post-op Pain Management:   ? ?Induction: Intravenous ? ?PONV Risk Score and Plan: 3 and Ondansetron, Dexamethasone, Midazolam and Treatment may vary due to age or medical condition ? ?Airway Management Planned: Oral ETT ? ?Additional Equipment:  ? ?Intra-op Plan:  ? ?Post-operative Plan: Extubation in OR ? ?Informed Consent: I have reviewed the patients History and Physical, chart, labs and discussed the procedure including the risks, benefits and alternatives for the proposed anesthesia with the patient or authorized representative who has indicated his/her understanding and acceptance.  ? ? ? ?Dental advisory given ? ?Plan Discussed with: CRNA ? ?Anesthesia Plan Comments:   ? ? ? ? ? ? ?Anesthesia Quick Evaluation ? ?

## 2021-07-24 NOTE — Op Note (Signed)
Breast Reduction Op note:  ? ? ?DATE OF PROCEDURE: 07/24/2021 ? ?LOCATION: [facility] ? ?SURGEON: Deondray Ospina Sanger Doryan Bahl, DO ? ?ASSISTANT: Donnamarie Rossetti, PA ? ?PREOPERATIVE DIAGNOSIS ?1. Left breast cancer  ?2. Breast asymmetry after left partial mastectomy ?3. Macromastia ? ?POSTOPERATIVE DIAGNOSIS ?Same as preoperative diagnosis ? ?PROCEDURES ?1. Right breast reduction 200 g, ?2. Left breast mastopexy  ? ?COMPLICATIONS: None. ? ?DRAINS: none ? ?INDICATIONS FOR PROCEDURE ?Joan Dalton is a 46 y.o. year-old female born on [birth year],with left breast cancer.  She underwent a left breast partial mastectomy.  She had asymmetry and desired for symmetry.   ?MRN: [id number] ? ?CONSENT ?Informed consent was obtained directly from the patient. The risks, benefits and alternatives were fully discussed. Specific risks including but not limited to bleeding, infection, hematoma, seroma, scarring, pain, nipple necrosis, asymmetry, poor cosmetic results, and need for further surgery were discussed. The patient had ample opportunity to have her questions answered to her satisfaction. ? ?DESCRIPTION OF PROCEDURE ? ?Patient was brought into the operating room and placed in a supine position.  SCDs were placed and appropriate padding was performed.  Antibiotics were given. The patient underwent general anesthesia and the chest was prepped and draped in a sterile fashion.  A timeout was performed and all information was confirmed to be correct. ? ?Right side: Preoperative markings were confirmed.  Incision lines were injected with local with epinephrine.  After waiting for vasoconstriction, the marked lines were incised.  A lollipop type superomedial breast reduction was performed by de-epithelializing the pedicle, using bovie to create the superomedial pedicle, and removing breast tissue from the inferior portion of the breast.  The breast pedicle was not undermined. Hemostasis was achieved.  The  nipple was gently rotated into position and the soft tissue closed with 3-0 and 4-0 Monocryl.   The pocket was irrigated and hemostasis confirmed.  The deep tissues were approximated with 3-0 PDS and 3-0 Monocryl sutures and the skin was closed with 4-0 Monocryl sutures.  The nipple and skin flaps had good capillary refill at the end of the procedure.   ? ?Left side: Preoperative markings were confirmed.  Incision lines were injected with local with epinephrine.  After waiting for vasoconstriction, the marked lines were incised.  A lollipop type mastopexy was performed by de-epithelializing the vertical limb.  The beast pedicle was not disturbed. Hemostasis was achieved.  The nipple was gently rotated into position and the soft tissue was closed with 3-0 and 4-0 Monocryl.  The patient was sat upright and size and shape symmetry was confirmed.  The deep tissues were approximated with 3-0 PDS and 3-0 Monocryl sutures and the skin was closed with deep dermal and subcuticular 4-0 Monocryl sutures.  Dermabond was applied.  A breast binder and ABDs were placed.  The nipple and skin flaps had good capillary refill at the end of the procedure.  The patient tolerated the procedure well. The patient was allowed to wake from anesthesia and taken to the recovery room in satisfactory condition. ? ?The advanced practice practitioner (APP) assisted throughout the case.  The APP was essential in retraction and counter traction when needed to make the case progress smoothly.  This retraction and assistance made it possible to see the tissue plans for the procedure.  The assistance was needed for blood control, tissue re-approximation and assisted with closure of the incision site. ? ?

## 2021-07-24 NOTE — Transfer of Care (Signed)
Immediate Anesthesia Transfer of Care Note ? ?Patient: Joan Dalton ? ?Procedure(s) Performed: BILATERAL ONCOPLASTIC BREAST REDUCTION (Bilateral: Breast) ?MASTOPEXY (Bilateral: Breast) ? ?Patient Location: PACU ? ?Anesthesia Type:General ? ?Level of Consciousness: awake, alert  and oriented ? ?Airway & Oxygen Therapy: Patient Spontanous Breathing ? ?Post-op Assessment: Report given to RN, Post -op Vital signs reviewed and stable and Patient moving all extremities X 4 ? ?Post vital signs: Reviewed and stable ? ?Last Vitals:  ?Vitals Value Taken Time  ?BP 157/89 07/24/21 1222  ?Temp    ?Pulse 99 07/24/21 1224  ?Resp 19 07/24/21 1224  ?SpO2 94 % 07/24/21 1224  ?Vitals shown include unvalidated device data. ? ?Last Pain:  ?Vitals:  ? 07/24/21 0915  ?TempSrc:   ?PainSc: 0-No pain  ?   ? ?  ? ?Complications: No notable events documented. ?

## 2021-07-24 NOTE — Discharge Instructions (Addendum)
INSTRUCTIONS FOR AFTER BREAST SURGERY   You will likely have some questions about what to expect following your operation.  The following information will help you and your family understand what to expect when you are discharged from the hospital.  Following these guidelines will help ensure a smooth recovery and reduce risks of complications.  Postoperative instructions include information on: diet, wound care, medications and physical activity.  AFTER SURGERY Expect to go home after the procedure.  In some cases, you may need to spend one night in the hospital for observation.  DIET Breast surgery does not require a specific diet.  However, the healthier you eat the better your body can start healing. It is important to increasing your protein intake.  This means limiting the foods with sugar and carbohydrates.  Focus on vegetables and some meat.  If you have any liposuction during your procedure be sure to drink water.  If your urine is bright yellow, then it is concentrated, and you need to drink more water.  As a general rule after surgery, you should have 8 ounces of water every hour while awake.  If you find you are persistently nauseated or unable to take in liquids let us know.  NO TOBACCO USE or EXPOSURE.  This will slow your healing process and increase the risk of a wound.  WOUND CARE Leave the ACE wrap or binder on for 3 days . Use fragrance free soap.   After 3 days you can remove the ACE wrap or binder to shower. Once dry apply ACE wrap, binder or sports bra.  Use a mild soap like Dial, Dove and Ivory. You may have Topifoam or Lipofoam on.  It is soft and spongy and helps keep you from getting creases if you have liposuction.  This can be removed before the shower and then replaced.  If you need more it is available on [employer] (Lipofoam). If you have steri-strips / tape directly attached to your skin leave them in place. It is OK to get these wet.   No baths, pools or hot tubs for four  weeks. We close your incision to leave the smallest and best-looking scar. No ointment or creams on your incisions until given the go ahead.  Especially not Neosporin (Too many skin reactions with this one).  A few weeks after surgery you can use Mederma and start massaging the scar. We ask you to wear your binder or sports bra for the first 6 weeks around the clock, including while sleeping. This provides added comfort and helps reduce the fluid accumulation at the surgery site.  ACTIVITY No heavy lifting until cleared by the doctor.  This usually means no more than a half-gallon of milk.  It is OK to walk and climb stairs. In fact, moving your legs is very important to decrease your risk of a blood clot.  It will also help keep you from getting deconditioned.  Every 1 to 2 hours get up and walk for 5 minutes. This will help with a quicker recovery back to normal.  Let pain be your guide so you don't do too much.  This is not the time for spring cleaning and don't plan on taking care of anyone else.  This time is for you to recover,  You will be more comfortable if you sleep and rest with your head elevated either with a few pillows under you or in a recliner.  No stomach sleeping for a three months.  WORK Everyone   returns to work at different times. As a rough guide, most people take at least 1 - 2 weeks off prior to returning to work. If you need documentation for your job, bring the forms to your postoperative follow up visit.  DRIVING Arrange for someone to bring you home from the hospital.  You may be able to drive a few days after surgery but not while taking any narcotics or valium.  BOWEL MOVEMENTS Constipation can occur after anesthesia and while taking pain medication.  It is important to stay ahead for your comfort.  We recommend taking Milk of Magnesia (2 tablespoons; twice a day) while taking the pain pills.  MEDICATIONS You may be prescribed should start after surgery At your  preoperative visit for you history and physical you may have been given the following medications: An antibiotic: Start this medication when you get home and take according to the instructions on the bottle. Zofran 4 mg:  This is to treat nausea and vomiting.  You can take this every 6 hours as needed and only if needed. Valium 2 mg: This is for muscle tightness if you have an implant or expander. This will help relax your muscle which also helps with pain control.  This can be taken every 12 hours as needed. Don't drive after taking this medication. Norco (hydrocodone/acetaminophen) 5/325 mg:  This is only to be used after you have taken the motrin or the tylenol. Every 8 hours as needed.   Over the counter Medication to take: Ibuprofen (Motrin) 600 mg:  Take this every 6 hours.  If you have additional pain then take 500 mg of the tylenol every 8 hours.  Only take the Norco after you have tried these two. Miralax or stool softener of choice: Take this according to the bottle if you take the Norco.  WHEN TO CALL Call your surgeon's office if any of the following occur: Fever 101 degrees F or greater Excessive bleeding or fluid from the incision site. Pain that increases over time without aid from the medications Redness, warmth, or pus draining from incision sites Persistent nausea or inability to take in liquids Severe misshapen area that underwent the operation. 

## 2021-07-24 NOTE — Interval H&P Note (Signed)
History and Physical Interval Note: ? ?07/24/2021 ?10:28 AM ? ?Joan Dalton  has presented today for surgery, with the diagnosis of Ductal Carcinoma In Situ.  The various methods of treatment have been discussed with the patient and family. After consideration of risks, benefits and other options for treatment, the patient has consented to  Procedure(s): ?MAMMARY REDUCTION  (BREAST) (Bilateral) ?MASTOPEXY (Bilateral) as a surgical intervention.  The patient's history has been reviewed, patient examined, no change in status, stable for surgery.  I have reviewed the patient's chart and labs.  Questions were answered to the patient's satisfaction.   ? ? ?Loel Lofty Jernee Murtaugh ? ? ?

## 2021-07-24 NOTE — Anesthesia Postprocedure Evaluation (Signed)
Anesthesia Post Note ? ?Patient: Joan Dalton ? ?Procedure(s) Performed: BILATERAL ONCOPLASTIC BREAST REDUCTION (Bilateral: Breast) ?MASTOPEXY (Bilateral: Breast) ? ?  ? ?Patient location during evaluation: PACU ?Anesthesia Type: General ?Level of consciousness: awake ?Pain management: pain level controlled ?Vital Signs Assessment: post-procedure vital signs reviewed and stable ?Respiratory status: spontaneous breathing, nonlabored ventilation, respiratory function stable and patient connected to nasal cannula oxygen ?Cardiovascular status: blood pressure returned to baseline and stable ?Postop Assessment: no apparent nausea or vomiting ?Anesthetic complications: no ? ? ?No notable events documented. ? ?Last Vitals:  ?Vitals:  ? 07/24/21 1255 07/24/21 1310  ?BP: (!) 161/90 (!) 166/95  ?Pulse: 85 87  ?Resp: 13 11  ?Temp:    ?SpO2: 100% 94%  ?  ?Last Pain:  ?Vitals:  ? 07/24/21 1310  ?TempSrc:   ?PainSc: 5   ? ? ?  ?  ?  ?  ?  ?  ? ?Bodie Abernethy P Tion Tse ? ? ? ? ?

## 2021-07-24 NOTE — Anesthesia Procedure Notes (Signed)
Procedure Name: Intubation ?Date/Time: 07/24/2021 10:43 AM ?Performed by: Reeves Dam, CRNA ?Pre-anesthesia Checklist: Patient identified, Patient being monitored, Timeout performed, Emergency Drugs available and Suction available ?Patient Re-evaluated:Patient Re-evaluated prior to induction ?Oxygen Delivery Method: Circle system utilized ?Preoxygenation: Pre-oxygenation with 100% oxygen ?Induction Type: IV induction ?Ventilation: Mask ventilation without difficulty ?Laryngoscope Size: 3 and Miller ?Grade View: Grade I ?Tube type: Oral ?Tube size: 7.5 mm ?Number of attempts: 1 ?Airway Equipment and Method: Stylet ?Placement Confirmation: ETT inserted through vocal cords under direct vision, positive ETCO2 and breath sounds checked- equal and bilateral ?Secured at: 21 cm ?Tube secured with: Tape ?Dental Injury: Teeth and Oropharynx as per pre-operative assessment  ? ? ? ? ?

## 2021-07-25 ENCOUNTER — Encounter (HOSPITAL_COMMUNITY): Payer: Self-pay | Admitting: Plastic Surgery

## 2021-07-26 LAB — SURGICAL PATHOLOGY

## 2021-07-30 NOTE — Progress Notes (Signed)
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 Marla Roe who presents to clinic for postoperative follow-up.  Patient's PMH is significant for previous pulmonary embolism.   ? ?Today, patient is doing well.  She states that she still will take her narcotic analgesic intermittently for pain symptoms, but largely improved.  She states that the right side feels a bit larger than the left, but reports that was the case preoperatively as well.  The left breast was site of her lumpectomy and axillary sentinel node biopsy 06/17/2021 Dr. Marlou Starks.  She denies any redness, drainage, fevers, or progressively worsening pain symptoms.   ? ?She is no longer on her postoperative Lovenox, so I encouraged her to begin taking the B Profen 600 mg every 6 hours in addition to the Tylenol and reduce her need for continued oxycodone.  She is already going to the gym for cardiac exercise such as biking, but is avoiding any arm exercise.  Denies any unilateral extremity swelling or CP/SOB. ? ?Physical exam is reassuring.  Right breast does appear to be slightly larger than the left, but no obvious seroma or hematoma.  Breasts are soft to the touch.  Lollipop incisions bilaterally, Steri-Strips remain firmly intact.  No drainage.  Left NAC does appear to be a bit duskier than the right NAC.  Suspect however that it could be due to dryness rather than ischemia. ? ?Recommending that she apply Vaseline as needed to the left NAC given that it appears to be a bit darker than the right NAC.  Feels warm, perfused.  Suspect that it is simply dry.  She understands that the petroleum-based products will cause the Dermabond to breakdown, but it has also been repaired with Monocryl sutures and she is at low risk for dehiscence. ? ?Recommend continued activity modifications and compressive garments.  She reports that she already has her next postoperative visit  scheduled for next week. ? ? ? ? ?

## 2021-08-01 ENCOUNTER — Other Ambulatory Visit: Payer: Self-pay

## 2021-08-01 ENCOUNTER — Inpatient Hospital Stay: Payer: BC Managed Care – PPO | Attending: Genetic Counselor | Admitting: Hematology and Oncology

## 2021-08-01 ENCOUNTER — Encounter: Payer: Self-pay | Admitting: Hematology and Oncology

## 2021-08-01 VITALS — BP 126/87 | HR 81 | Temp 97.7°F | Resp 16 | Ht 66.0 in | Wt 267.5 lb

## 2021-08-01 DIAGNOSIS — D0512 Intraductal carcinoma in situ of left breast: Secondary | ICD-10-CM | POA: Insufficient documentation

## 2021-08-01 DIAGNOSIS — Z7901 Long term (current) use of anticoagulants: Secondary | ICD-10-CM | POA: Insufficient documentation

## 2021-08-01 DIAGNOSIS — Z86711 Personal history of pulmonary embolism: Secondary | ICD-10-CM | POA: Insufficient documentation

## 2021-08-01 NOTE — Progress Notes (Signed)
[place] ?CONSULT NOTE ? ?Patient Care Team: ?Mckinley Jewel, MD as PCP - General (Internal Medicine) ?Rockwell Germany, RN as Oncology Nurse Navigator ?Mauro Kaufmann, RN as Oncology Nurse Navigator ?Benay Pike, MD as Consulting Physician (Hematology and Oncology) ?Jovita Kussmaul, MD as Consulting Physician (General Surgery) ?Kyung Rudd, MD as Consulting Physician (Radiation Oncology) ? ?CHIEF COMPLAINTS/PURPOSE OF CONSULTATION:  ?Newly diagnosed breast cancer ? ?HISTORY OF PRESENTING ILLNESS:  ?Joan Dalton 46 y.o. female is here because of recent diagnosis of left breast cancer ? ?I reviewed her records extensively and collaborated the history with the patient. ? ?SUMMARY OF ONCOLOGIC HISTORY: ?Oncology History  ?Ductal carcinoma in situ (DCIS) of left breast  ?05/20/2021 Initial Diagnosis  ? Ductal carcinoma in situ (DCIS) of left breast ?  ?06/05/2021 Genetic Testing  ? Negative genetic testing on the CancerNext-Expanded+RNAinsight panel.  The report date is June 05, 2021. ? ?The CancerNext-Expanded gene panel offered by [facility] and includes sequencing and rearrangement analysis for the following 77 genes: AIP, ALK, APC*, ATM*, AXIN2, BAP1, BARD1, BLM, BMPR1A, BRCA1*, BRCA2*, BRIP1*, CDC73, CDH1*, CDK4, CDKN1B, CDKN2A, CHEK2*, CTNNA1, DICER1, FANCC, FH, FLCN, GALNT12, KIF1B, LZTR1, MAX, MEN1, MET, MLH1*, MSH2*, MSH3, MSH6*, MUTYH*, NBN, NF1*, NF2, NTHL1, PALB2*, PHOX2B, PMS2*, POT1, PRKAR1A, PTCH1, PTEN*, RAD51C*, RAD51D*, RB1, RECQL, RET, SDHA, SDHAF2, SDHB, SDHC, SDHD, SMAD4, SMARCA4, SMARCB1, SMARCE1, STK11, SUFU, TMEM127, TP53*, TSC1, TSC2, VHL and XRCC2 (sequencing and deletion/duplication); EGFR, EGLN1, HOXB13, KIT, MITF, PDGFRA, POLD1, and POLE (sequencing only); EPCAM and GREM1 (deletion/duplication only). DNA and RNA analyses performed for * genes.  ?  ?06/17/2021 Surgery  ? Patient had left breast lumpectomy which showed high-grade ductal carcinoma in situ with  necrosis and calcifications, resection margins are negative for carcinoma left additional superior margin excision again negative for carcinoma.  She had 3 out of 3 lymph nodes without involvement.  Prior prognostic showed ER 85% positive strong staining PR 95% positive strong staining. ?She is now undergoing plastic surgery, had right and left breast mammoplasty with no evidence of malignancy. ?  ? ?She has a follow-up with radiation oncology 08/21/2021.  We calculated DCIS nomogram from [facility] which showed 5-year risk of recurrence of about 3 to 4% ?She had some other questions about the pathology today.  She also had some questions about dietary changes that was recommended to her by some of her friends.  Rest of the pertinent 10 point ROS reviewed and negative ? ?MEDICAL HISTORY:  ?Past Medical History:  ?Diagnosis Date  ? Breast cancer ([place]) 05/08/2021  ? Family history of breast cancer   ? Family history of colon cancer   ? Family history of pancreatic cancer   ? ? ?SURGICAL HISTORY: ?Past Surgical History:  ?Procedure Laterality Date  ? AXILLARY SENTINEL NODE BIOPSY Left 06/17/2021  ? Procedure: LEFT AXILLARY SENTINEL NODE BIOPSY;  Surgeon: Jovita Kussmaul, MD;  Location: [place];  Service: General;  Laterality: Left;  ? BREAST BIOPSY Left 05/08/2021  ? BREAST LUMPECTOMY Right 10/1998  ? BREAST LUMPECTOMY WITH RADIOACTIVE SEED AND SENTINEL LYMPH NODE BIOPSY Left 06/17/2021  ? Procedure: LEFT BREAST LUMPECTOMY WITH RADIOACTIVE SEEDS x 2;  Surgeon: Jovita Kussmaul, MD;  Location: [place];  Service: General;  Laterality: Left;  ? BREAST REDUCTION SURGERY Bilateral 07/24/2021  ? Procedure: BILATERAL ONCOPLASTIC BREAST REDUCTION;  Surgeon: Wallace Going, DO;  Location: [place];  Service: Plastics;  Laterality: Bilateral;  ? MASTOPEXY Bilateral 07/24/2021  ? Procedure: MASTOPEXY;  Surgeon: Wallace Going, DO;  Location: [place];  Service: Plastics;  Laterality: Bilateral;  ? ? ?SOCIAL HISTORY: ?Social History   ? ?Socioeconomic History  ? Marital status: Married  ?  Spouse name: GRISELA MESCH II  ? Number of children: 5  ? Years of education: Not on file  ? Highest education level: Not on file  ?Occupational History  ? Not on file  ?Tobacco Use  ? Smoking status: Never  ? Smokeless tobacco: Never  ?Vaping Use  ? Vaping Use: Never used  ?Substance and Sexual Activity  ? Alcohol use: Never  ? Drug use: Never  ? Sexual activity: Yes  ?Other Topics Concern  ? Not on file  ?Social History Narrative  ? Not on file  ? ?Social Determinants of Health  ? ?Financial Resource Strain: Not on file  ?Food Insecurity: Not on file  ?Transportation Needs: Not on file  ?Physical Activity: Not on file  ?Stress: Not on file  ?Social Connections: Not on file  ?Intimate Partner Violence: Not At Risk  ? Fear of Current or Ex-Partner: No  ? Emotionally Abused: No  ? Physically Abused: No  ? Sexually Abused: No  ? ? ?FAMILY HISTORY: ?Family History  ?Problem Relation Age of Onset  ? Breast cancer Mother   ?     21s & 29s  ? Prostate cancer Father   ? Breast cancer Maternal Aunt 75  ? Pancreatic cancer Maternal Aunt 28  ? Breast cancer Maternal Aunt   ?     later 30s-early 9s  ? Colon cancer Maternal Aunt 31  ? Colon cancer Maternal Uncle   ?     41s  ? Colon cancer Maternal Grandfather   ? Pancreatic cancer Cousin   ?     mother's maternal first cousin  ? Pancreatic cancer Cousin   ?     mother's maternal first cousin  ? ? ?ALLERGIES:  is allergic to sulfamethoxazole-trimethoprim. ? ?MEDICATIONS:  ?Current Outpatient Medications  ?Medication Sig Dispense Refill  ? acetaminophen (TYLENOL) 500 MG tablet Take 1,000 mg by mouth every 6 (six) hours as needed for moderate pain or headache.    ? COD LIVER OIL PO Take 5 mLs by mouth 3 (three) times a week.    ? enoxaparin (LOVENOX) 40 MG/0.4ML injection Inject 0.4 mLs (40 mg total) into the skin daily for 7 days. 2.8 mL 0  ? ferrous sulfate 325 (65 FE) MG tablet Take 325 mg by mouth daily in the  afternoon.    ? ibuprofen (ADVIL) 200 MG tablet Take 400 mg by mouth every 6 (six) hours as needed for moderate pain.    ? Multiple Vitamin (MULTI-VITAMIN) tablet Take 1 tablet by mouth daily.    ? ondansetron (ZOFRAN-ODT) 4 MG disintegrating tablet Take 1 tablet (4 mg total) by mouth every 8 (eight) hours as needed for nausea or vomiting. 20 tablet 0  ? oxyCODONE (ROXICODONE) 5 MG immediate release tablet Take 1 tablet (5 mg total) by mouth every 6 (six) hours as needed for severe pain. 15 tablet 0  ? VITAMIN D PO Take 1 capsule by mouth daily.    ? ?No current facility-administered medications for this visit.  ? ? ? ?PHYSICAL EXAMINATION: ?ECOG PERFORMANCE STATUS: 0 - Asymptomatic ? ?Vitals:  ? 08/01/21 0847  ?BP: 126/87  ?Pulse: 81  ?Resp: 16  ?Temp: 97.7 ?F (36.5 ?C)  ?SpO2: 99%  ? ?Filed Weights  ? 08/01/21 0847  ?Weight: 267 lb  8 oz (121.3 kg)  ? ?Physical exam deferred today in lieu of counseling ? ?LABORATORY DATA:  ?I have reviewed the data as listed ?Lab Results  ?Component Value Date  ? WBC 6.1 07/22/2021  ? HGB 11.9 (L) 07/22/2021  ? HCT 36.0 07/22/2021  ? MCV 95.0 07/22/2021  ? PLT 237 07/22/2021  ? ?Lab Results  ?Component Value Date  ? NA 140 06/12/2021  ? K 4.0 06/12/2021  ? CL 104 06/12/2021  ? CO2 26 06/12/2021  ? ? ?RADIOGRAPHIC STUDIES: ?I have personally reviewed the radiological reports and agreed with the findings in the report. ? ?ASSESSMENT AND PLAN:  ? ?This is a very pleasant 46 year old female patient (menopausal status unknown, last.  Several years ago but had endometrial ablation) referred to hematology/oncology for DCIS of the right breast and recommendations regarding the above.  We have discussed the following findings about DCIS. ? ?We have once again today reviewed her pathology which showed ductal carcinoma in situ, calculated her risk of recurrence based on [facility] nomogram.  She is yet to start radiation.  We will consider antiestrogen therapy after she completed radiation.  Since  she had an unprovoked PE, we have discussed about considering anastrozole with ovarian suppression since there is concern about Khary clotting on tamoxifen.  We have once again reviewed the side effects with tamoxifen

## 2021-08-02 ENCOUNTER — Ambulatory Visit (INDEPENDENT_AMBULATORY_CARE_PROVIDER_SITE_OTHER): Payer: BC Managed Care – PPO | Admitting: Physician Assistant

## 2021-08-02 ENCOUNTER — Encounter: Payer: Self-pay | Admitting: Physician Assistant

## 2021-08-02 DIAGNOSIS — D0512 Intraductal carcinoma in situ of left breast: Secondary | ICD-10-CM

## 2021-08-13 ENCOUNTER — Ambulatory Visit (INDEPENDENT_AMBULATORY_CARE_PROVIDER_SITE_OTHER): Payer: BC Managed Care – PPO | Admitting: Plastic Surgery

## 2021-08-13 ENCOUNTER — Encounter: Payer: Self-pay | Admitting: Plastic Surgery

## 2021-08-13 DIAGNOSIS — N6489 Other specified disorders of breast: Secondary | ICD-10-CM

## 2021-08-13 DIAGNOSIS — D0512 Intraductal carcinoma in situ of left breast: Secondary | ICD-10-CM

## 2021-08-13 NOTE — Progress Notes (Signed)
   Subjective:    Patient ID: Joan Dalton, female    DOB: November 25, 1975, 46 y.o.   MRN: [id number]  The patient is a 46 year old female here for follow-up after undergoing bilateral breast reduction for oncoplastic reasons.  She is doing really well.  There is no sign of infection.  The Steri-Strips are in place.  She has noticed some twinges that is likely the nerves coming back alive.  Continue with sports bra     Review of Systems  Constitutional: Negative.   Eyes: Negative.   Respiratory: Negative.    Cardiovascular: Negative.   Endocrine: Negative.   Genitourinary: Negative.       Objective:   Physical Exam Constitutional:      Appearance: Normal appearance.  Cardiovascular:     Rate and Rhythm: Normal rate.     Pulses: Normal pulses.  Pulmonary:     Effort: Pulmonary effort is normal.  Neurological:     Mental Status: She is alert and oriented to person, place, and time.  Psychiatric:        Mood and Affect: Mood normal.        Behavior: Behavior normal.       Assessment & Plan:     ICD-10-CM   1. Postoperative breast asymmetry  N64.89     2. Ductal carcinoma in situ (DCIS) of left breast  D05.12       Follow up in 2-3 weeks.  Continue with sports bra. Pictures at next visit.

## 2021-08-20 ENCOUNTER — Telehealth: Payer: Self-pay

## 2021-08-20 NOTE — Telephone Encounter (Signed)
Appointment reminder. Reminded patient of her 1:30pm-08/21/21 in-person appointment w/ Shona Simpson PA-C. I advised patient to arrive 18mn early for check-in. I left my extension [phone] case patient needs anything. Patient verbalized understanding.

## 2021-08-21 ENCOUNTER — Ambulatory Visit
Admission: RE | Admit: 2021-08-21 | Discharge: 2021-08-21 | Disposition: A | Discharge status: Home / Self Care | Payer: BC Managed Care – PPO | Source: Ambulatory Visit | Attending: Radiation Oncology | Admitting: Radiation Oncology

## 2021-08-21 ENCOUNTER — Encounter: Payer: Self-pay | Admitting: Radiation Oncology

## 2021-08-21 ENCOUNTER — Other Ambulatory Visit: Payer: Self-pay

## 2021-08-21 VITALS — BP 158/114 | HR 101 | Temp 97.5°F | Resp 20 | Ht 66.0 in | Wt 270.0 lb

## 2021-08-21 DIAGNOSIS — R030 Elevated blood-pressure reading, without diagnosis of hypertension: Secondary | ICD-10-CM | POA: Insufficient documentation

## 2021-08-21 DIAGNOSIS — D0512 Intraductal carcinoma in situ of left breast: Secondary | ICD-10-CM | POA: Insufficient documentation

## 2021-08-21 DIAGNOSIS — Z800 Family history of malignant neoplasm of digestive organs: Secondary | ICD-10-CM | POA: Insufficient documentation

## 2021-08-21 DIAGNOSIS — N6081 Other benign mammary dysplasias of right breast: Secondary | ICD-10-CM | POA: Diagnosis not present

## 2021-08-21 DIAGNOSIS — Z170 Estrogen receptor positive status [ER+]: Secondary | ICD-10-CM | POA: Insufficient documentation

## 2021-08-21 DIAGNOSIS — Z7901 Long term (current) use of anticoagulants: Secondary | ICD-10-CM | POA: Diagnosis not present

## 2021-08-21 DIAGNOSIS — Z803 Family history of malignant neoplasm of breast: Secondary | ICD-10-CM | POA: Diagnosis not present

## 2021-08-21 NOTE — Progress Notes (Signed)
Follow-up-new appointment. I verified patient's identity and began nursing interview. Patient reports bilateral breast tenderness 1/10, that's improving. No other issues reported at this time.  Meaningful use complete.  Patient states "Post ablation in 2019, her menstruals stopped x4 yrs and around 08/17/21 her menstruals have suddenly returned, but there is NO chance of pregnancy."  BP (!) 158/114 (BP Location: Right Arm, Patient Position: Sitting, Cuff Size: Large)   Pulse (!) 101   Temp (!) 97.5 F (36.4 C) (Temporal)   Resp 20   Ht '5\' 6"'$  (1.676 m)   Wt 270 lb (122.5 kg)   LMP 08/17/2021   SpO2 99%   BMI 43.58 kg/m

## 2021-08-21 NOTE — Progress Notes (Signed)
Radiation Oncology         [phone] ________________________________  Name: Joan Dalton        MRN: [id number]  Date of Service: 08/21/2021 DOB: [birth year]  KZ:SWFUXNA, [name], MD  Benay Pike, MD     REFERRING PHYSICIAN: Benay Pike, MD   DIAGNOSIS: The encounter diagnosis was Ductal carcinoma in situ (DCIS) of left breast.   HISTORY OF PRESENT ILLNESS: Joan Dalton is a 46 y.o. female with a recent history of left DCIS.  The patient originally presented with screening detected calcifications in the left breast.  She has a family history of breast cancer.  Diagnostic imaging by mammogram showed a 2.8 cm span of calcifications in the upper outer quadrant.  She underwent stereotactic biopsy on 05/08/2021.  This revealed high-grade DCIS with focal microinvasion that could not be ruled out, associated fibrocystic change with PASH. Her cancer was ER/PR positive.   Since her last visit, the patient has undergone left lumpectomy with left axillary sentinel lymph node biopsy on 06/17/2021 which revealed high-grade DCIS with necrosis and calcifications with negative resection margins.  3 sentinel lymph nodes were all negative for disease, and her margins were clear though the closest was 2 mm from the posterior margin.  She desired oncoplastic reduction and underwent bilateral mammoplasty with Dr. Elisabeth Cara on 07/24/2021.  Final pathology of the right breast showed fibrocystic change with apocrine metaplasia and usual ductal hyperplasia and focal fibroadenomatoid change.  The left breast specimen revealed benign breast skin and subcutaneous tissue.  She is seen to discuss adjuvant radiotherapy.   PREVIOUS RADIATION THERAPY: No   PAST MEDICAL HISTORY:  Past Medical History:  Diagnosis Date   Breast cancer ([place]) 05/08/2021   Family history of breast cancer    Family history of colon cancer    Family history of pancreatic cancer        PAST SURGICAL HISTORY: Past  Surgical History:  Procedure Laterality Date   AXILLARY SENTINEL NODE BIOPSY Left 06/17/2021   Procedure: LEFT AXILLARY SENTINEL NODE BIOPSY;  Surgeon: Jovita Kussmaul, MD;  Location: [place];  Service: General;  Laterality: Left;   BREAST BIOPSY Left 05/08/2021   BREAST LUMPECTOMY Right 10/1998   BREAST LUMPECTOMY WITH RADIOACTIVE SEED AND SENTINEL LYMPH NODE BIOPSY Left 06/17/2021   Procedure: LEFT BREAST LUMPECTOMY WITH RADIOACTIVE SEEDS x 2;  Surgeon: Jovita Kussmaul, MD;  Location: [place];  Service: General;  Laterality: Left;   BREAST REDUCTION SURGERY Bilateral 07/24/2021   Procedure: BILATERAL ONCOPLASTIC BREAST REDUCTION;  Surgeon: Wallace Going, DO;  Location: [place];  Service: Plastics;  Laterality: Bilateral;   MASTOPEXY Bilateral 07/24/2021   Procedure: MASTOPEXY;  Surgeon: Wallace Going, DO;  Location: [place];  Service: Plastics;  Laterality: Bilateral;     FAMILY HISTORY:  Family History  Problem Relation Age of Onset   Breast cancer Mother        32s & 15s   Prostate cancer Father    Breast cancer Maternal Aunt 75   Pancreatic cancer Maternal Aunt 75   Breast cancer Maternal Aunt        later 30s-early 13s   Colon cancer Maternal Aunt 42   Colon cancer Maternal Uncle        60s   Colon cancer Maternal Grandfather    Pancreatic cancer Cousin        mother's maternal first cousin   Pancreatic cancer Cousin        mother's maternal  first cousin     SOCIAL HISTORY:  reports that she has never smoked. She has never used smokeless tobacco. She reports that she does not drink alcohol and does not use drugs. The patient is married and lives in [place]. They relocated from [place] recently due to her husband's job as an Psychologist, sport and exercise at [employer]. 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 They have a middle school aged son and high school aged daughter.    ALLERGIES: Sulfamethoxazole-trimethoprim   MEDICATIONS:  Current  Outpatient Medications  Medication Sig Dispense Refill   acetaminophen (TYLENOL) 500 MG tablet Take 1,000 mg by mouth every 6 (six) hours as needed for moderate pain or headache.     COD LIVER OIL PO Take 5 mLs by mouth 3 (three) times a week.     enoxaparin (LOVENOX) 40 MG/0.4ML injection Inject 0.4 mLs (40 mg total) into the skin daily for 7 days. 2.8 mL 0   ferrous sulfate 325 (65 FE) MG tablet Take 325 mg by mouth daily in the afternoon.     ibuprofen (ADVIL) 200 MG tablet Take 400 mg by mouth every 6 (six) hours as needed for moderate pain.     Multiple Vitamin (MULTI-VITAMIN) tablet Take 1 tablet by mouth daily.     ondansetron (ZOFRAN-ODT) 4 MG disintegrating tablet Take 1 tablet (4 mg total) by mouth every 8 (eight) hours as needed for nausea or vomiting. 20 tablet 0   oxyCODONE (ROXICODONE) 5 MG immediate release tablet Take 1 tablet (5 mg total) by mouth every 6 (six) hours as needed for severe pain. 15 tablet 0   VITAMIN D PO Take 1 capsule by mouth daily.     No current facility-administered medications for this encounter.     REVIEW OF SYSTEMS: On review of systems, the patient reports that she is doing well. Her breast incision sites are tender but no redness, frank pain or other concerns are noted. No fevers are present. She is nervous about treatment but no other complaints are verbalized.      PHYSICAL EXAM:  Wt Readings from Last 3 Encounters:  08/21/21 270 lb (122.5 kg)  08/01/21 267 lb 8 oz (121.3 kg)  07/24/21 266 lb (120.7 kg)   Temp Readings from Last 3 Encounters:  08/21/21 (!) 97.5 F (36.4 C) (Temporal)  08/01/21 97.7 F (36.5 C) (Temporal)  07/24/21 (!) 97.4 F (36.3 C)   BP Readings from Last 3 Encounters:  08/21/21 (!) 158/114  08/01/21 126/87  07/24/21 (!) 166/95   Pulse Readings from Last 3 Encounters:  08/21/21 (!) 101  08/01/21 81  07/24/21 87    In general this is a well appearing  African American female in no acute distress. She's alert  and oriented x4 and appropriate throughout the examination. Cardiopulmonary assessment is negative for acute distress and she exhibits normal effort.  Bilateral breast show a well-healing keyhole incision sites.  The left breast specifically does not have any erythema, separation or drainage.    ECOG = 1  0 - Asymptomatic (Fully active, able to carry on all predisease activities without restriction)  1 - Symptomatic but completely ambulatory (Restricted in physically strenuous activity but ambulatory and able to carry out work of a light or sedentary nature. For example, light housework, office work)  2 - Symptomatic, <50% in bed during the day (Ambulatory and capable of all self care but unable to carry out any work activities. Up and about more  than 50% of waking hours)  3 - Symptomatic, >50% in bed, but not bedbound (Capable of only limited self-care, confined to bed or chair 50% or more of waking hours)  4 - Bedbound (Completely disabled. Cannot carry on any self-care. Totally confined to bed or chair)  5 - Death   Eustace Pen MM, Creech RH, Tormey DC, et al. [phone]). "Toxicity and response criteria of the [facility]". [place] Oncol. 5 (6): 649-55    LABORATORY DATA:  Lab Results  Component Value Date   WBC 6.1 07/22/2021   HGB 11.9 (L) 07/22/2021   HCT 36.0 07/22/2021   MCV 95.0 07/22/2021   PLT 237 07/22/2021   Lab Results  Component Value Date   NA 140 06/12/2021   K 4.0 06/12/2021   CL 104 06/12/2021   CO2 26 06/12/2021   No results found for: ALT, AST, GGT, ALKPHOS, BILITOT    RADIOGRAPHY: No results found.     IMPRESSION/PLAN: 1. ER/PR positive high-grade DCIS of the left breast cannot rule out focal microinvasion. Dr. Lisbeth Renshaw has reviewed her case and her final pathology.  She has done well surgically, and is healed from her reduction surgery as well.  Dr. Lisbeth Renshaw recommends external radiotherapy to the breast to reduce risks of local  recurrence followed by antiestrogen therapy. We discussed the risks, benefits, short, and long term effects of radiotherapy, as well as the curative intent, and the patient is interested in proceeding.  I reviewed the delivery and logistics of radiotherapy and that Dr. Lisbeth Renshaw recommends 6 1/2 weeks of radiotherapy to the left breast with DIBH technique. Written consent is obtained and placed in the chart, a copy was provided to the patient.  She will simulate today. 2. Contraceptive Counseling. The patient's husband has undergone a vasectomy and she does not need pregnancy testing.  3. Elevated BP. This is likely due to nervousness about her appointment which she acknowledged, and confirmed that she is not having any shortness of breath or chest pain or headaches.  She will follow-up with her PCP if her blood pressures continue to be elevated.   In a visit lasting 45 minutes, greater than 50% of the time was spent face to face reviewing her case, as well as in preparation of, discussing, and coordinating the patient's care.     Carola Rhine, [facility]    **Disclaimer: This note was dictated with voice recognition software. Similar sounding words can inadvertently be transcribed and this note may contain transcription errors which may not have been corrected upon publication of note.**

## 2021-08-26 ENCOUNTER — Encounter: Payer: Self-pay | Admitting: *Deleted

## 2021-08-27 DIAGNOSIS — D0512 Intraductal carcinoma in situ of left breast: Secondary | ICD-10-CM | POA: Diagnosis present

## 2021-08-28 ENCOUNTER — Ambulatory Visit
Admission: RE | Admit: 2021-08-28 | Discharge: 2021-08-28 | Disposition: A | Discharge status: Home / Self Care | Payer: BC Managed Care – PPO | Source: Ambulatory Visit | Attending: Radiation Oncology | Admitting: Radiation Oncology

## 2021-08-28 ENCOUNTER — Other Ambulatory Visit: Payer: Self-pay

## 2021-08-28 DIAGNOSIS — D0512 Intraductal carcinoma in situ of left breast: Secondary | ICD-10-CM | POA: Diagnosis not present

## 2021-08-28 LAB — RAD ONC ARIA SESSION SUMMARY
Course Elapsed Days: 0
Plan Fractions Treated to Date: 1
Plan Prescribed Dose Per Fraction: 1.8 Gy
Plan Total Fractions Prescribed: 28
Plan Total Prescribed Dose: 50.4 Gy
Reference Point Dosage Given to Date: 1.8 Gy
Reference Point Session Dosage Given: 1.8 Gy
Session Number: 1

## 2021-08-29 ENCOUNTER — Ambulatory Visit
Admission: RE | Admit: 2021-08-29 | Discharge: 2021-08-29 | Disposition: A | Discharge status: Home / Self Care | Payer: BC Managed Care – PPO | Source: Ambulatory Visit | Attending: Radiation Oncology | Admitting: Radiation Oncology

## 2021-08-29 ENCOUNTER — Other Ambulatory Visit: Payer: Self-pay

## 2021-08-29 DIAGNOSIS — D0512 Intraductal carcinoma in situ of left breast: Secondary | ICD-10-CM | POA: Diagnosis not present

## 2021-08-29 LAB — RAD ONC ARIA SESSION SUMMARY
Course Elapsed Days: 1
Plan Fractions Treated to Date: 2
Plan Prescribed Dose Per Fraction: 1.8 Gy
Plan Total Fractions Prescribed: 28
Plan Total Prescribed Dose: 50.4 Gy
Reference Point Dosage Given to Date: 3.6 Gy
Reference Point Session Dosage Given: 1.8 Gy
Session Number: 2

## 2021-08-30 ENCOUNTER — Ambulatory Visit
Admission: RE | Admit: 2021-08-30 | Discharge: 2021-08-30 | Disposition: A | Discharge status: Home / Self Care | Payer: BC Managed Care – PPO | Source: Ambulatory Visit | Attending: Radiation Oncology | Admitting: Radiation Oncology

## 2021-08-30 ENCOUNTER — Other Ambulatory Visit: Payer: Self-pay

## 2021-08-30 DIAGNOSIS — D0512 Intraductal carcinoma in situ of left breast: Secondary | ICD-10-CM | POA: Diagnosis not present

## 2021-08-30 LAB — RAD ONC ARIA SESSION SUMMARY
Course Elapsed Days: 2
Plan Fractions Treated to Date: 3
Plan Prescribed Dose Per Fraction: 1.8 Gy
Plan Total Fractions Prescribed: 28
Plan Total Prescribed Dose: 50.4 Gy
Reference Point Dosage Given to Date: 5.4 Gy
Reference Point Session Dosage Given: 1.8 Gy
Session Number: 3

## 2021-08-30 MED ORDER — ALRA NON-METALLIC DEODORANT (RAD-ONC)
1.0000 "application " | Freq: Once | TOPICAL | Status: AC
  Administered 2021-08-30: 1 via TOPICAL

## 2021-08-30 MED ORDER — RADIAPLEXRX EX GEL: Freq: Once | CUTANEOUS | Status: AC

## 2021-08-30 NOTE — Progress Notes (Signed)
Pt here for patient teaching.  Pt given Radiation and You booklet, skin care instructions, alra deodorant and Radiaplex.    Reviewed areas of pertinence such as fatigue, hair loss, skin changes, breast tenderness, and breast swelling. Pt able to give teach back of to pat skin and use unscented/gentle soap,apply Radiaplex bid, avoid applying anything to skin within 4 hours of treatment, avoid wearing an under wire bra, and to use an electric razor if they must shave. Pt verbalizes understanding of information given and will contact nursing with any questions or concerns.    Shaneal Barasch M. Elda Dunkerson RN, BSN  

## 2021-09-02 ENCOUNTER — Ambulatory Visit
Admission: RE | Admit: 2021-09-02 | Discharge: 2021-09-02 | Disposition: A | Discharge status: Home / Self Care | Payer: BC Managed Care – PPO | Source: Ambulatory Visit | Attending: Radiation Oncology | Admitting: Radiation Oncology

## 2021-09-02 ENCOUNTER — Other Ambulatory Visit: Payer: Self-pay

## 2021-09-02 DIAGNOSIS — D0512 Intraductal carcinoma in situ of left breast: Secondary | ICD-10-CM | POA: Diagnosis not present

## 2021-09-02 LAB — RAD ONC ARIA SESSION SUMMARY
Course Elapsed Days: 5
Plan Fractions Treated to Date: 4
Plan Prescribed Dose Per Fraction: 1.8 Gy
Plan Total Fractions Prescribed: 28
Plan Total Prescribed Dose: 50.4 Gy
Reference Point Dosage Given to Date: 7.2 Gy
Reference Point Session Dosage Given: 1.8 Gy
Session Number: 4

## 2021-09-02 NOTE — Progress Notes (Signed)
Patient is a 46 year old female with history of left breast cancer status post partial mastectomy performed on 06/17/2021.  She later underwent a bilateral oncoplastic reduction and mastopexy on 07/24/2021 with Dr. Marla Roe.  Patient was last seen in the clinic on 08/13/2021.  At this visit, she was doing well and there were no signs of infections.  She did report some twinges of pain most likely associated with nerve pain.  Plan is to continue with compressive sports bra and to follow-up with [place] in 2 to 3 weeks.  Today, patient reports she is doing well.  She states that she started radiation and has had approximately 4 sessions already.  Patient reports since starting radiation her left NAC looks a little darker than her right NAC.  Patient also reports that she has started her menstrual cycle after not having it for years.  She denies any fevers or chills.  She denies any drainage or redness from her incisions. She denies any other issues at this time.  Chaperone present on exam.  On exam, patient is sitting upright in no acute distress.  The breasts are fairly symmetrical and soft.  The left NAC does appear slightly darker than the right NAC, but still appears viable.  The right NAC appears viable.  There does not appear to be any necrotic tissue at this time to the left NAC.  There is no significant swelling.  There is no ecchymosis, erythema or drainage.  Incisions are healing well.  There are no signs of infection. There are no wounds noted.  Discussed with patient that she does not need to do anything at this time for the left NAC and that it has most likely gotten darker due to the combination of surgery and radiation.  Discussed with patient that she can start scar creams such as Mederma to the right breast incisions.  Discussed that she may want to hold off on scar creams to the left breast given that she is undergoing radiation.  Discussed with patient that she can transition out of a sports bra  into regular bras without underwire.  Recommend patient to continue to wear sports bra at night.  Discussed with patient that she can start increasing her activity gradually.  Recommend patient to see her primary care provider or GYN in regards to her menstrual cycle.  Patient acknowledged.  Patient to follow-up in 2 to 3 weeks.  Pictures were obtained and placed in the patient's chart with the patient's permission  Discussed objective findings and plan with Dr. Marla Roe

## 2021-09-03 ENCOUNTER — Other Ambulatory Visit: Payer: Self-pay

## 2021-09-03 ENCOUNTER — Ambulatory Visit (INDEPENDENT_AMBULATORY_CARE_PROVIDER_SITE_OTHER): Payer: BC Managed Care – PPO | Admitting: Student

## 2021-09-03 ENCOUNTER — Ambulatory Visit
Admission: RE | Admit: 2021-09-03 | Discharge: 2021-09-03 | Disposition: A | Discharge status: Home / Self Care | Payer: BC Managed Care – PPO | Source: Ambulatory Visit | Attending: Radiation Oncology | Admitting: Radiation Oncology

## 2021-09-03 DIAGNOSIS — D0512 Intraductal carcinoma in situ of left breast: Secondary | ICD-10-CM

## 2021-09-03 LAB — RAD ONC ARIA SESSION SUMMARY
Course Elapsed Days: 6
Plan Fractions Treated to Date: 5
Plan Prescribed Dose Per Fraction: 1.8 Gy
Plan Total Fractions Prescribed: 28
Plan Total Prescribed Dose: 50.4 Gy
Reference Point Dosage Given to Date: 9 Gy
Reference Point Session Dosage Given: 1.8 Gy
Session Number: 5

## 2021-09-04 ENCOUNTER — Other Ambulatory Visit: Payer: Self-pay

## 2021-09-04 ENCOUNTER — Ambulatory Visit
Admission: RE | Admit: 2021-09-04 | Discharge: 2021-09-04 | Disposition: A | Discharge status: Home / Self Care | Payer: BC Managed Care – PPO | Source: Ambulatory Visit | Attending: Radiation Oncology | Admitting: Radiation Oncology

## 2021-09-04 DIAGNOSIS — D0512 Intraductal carcinoma in situ of left breast: Secondary | ICD-10-CM | POA: Diagnosis not present

## 2021-09-04 LAB — RAD ONC ARIA SESSION SUMMARY
Course Elapsed Days: 7
Plan Fractions Treated to Date: 6
Plan Prescribed Dose Per Fraction: 1.8 Gy
Plan Total Fractions Prescribed: 28
Plan Total Prescribed Dose: 50.4 Gy
Reference Point Dosage Given to Date: 10.8 Gy
Reference Point Session Dosage Given: 1.8 Gy
Session Number: 6

## 2021-09-05 ENCOUNTER — Ambulatory Visit
Admission: RE | Admit: 2021-09-05 | Discharge: 2021-09-05 | Disposition: A | Discharge status: Home / Self Care | Payer: BC Managed Care – PPO | Source: Ambulatory Visit | Attending: Radiation Oncology | Admitting: Radiation Oncology

## 2021-09-05 ENCOUNTER — Other Ambulatory Visit: Payer: Self-pay

## 2021-09-05 DIAGNOSIS — D0512 Intraductal carcinoma in situ of left breast: Secondary | ICD-10-CM | POA: Diagnosis not present

## 2021-09-05 LAB — RAD ONC ARIA SESSION SUMMARY
Course Elapsed Days: 8
Plan Fractions Treated to Date: 7
Plan Prescribed Dose Per Fraction: 1.8 Gy
Plan Total Fractions Prescribed: 28
Plan Total Prescribed Dose: 50.4 Gy
Reference Point Dosage Given to Date: 12.6 Gy
Reference Point Session Dosage Given: 1.8 Gy
Session Number: 7

## 2021-09-06 ENCOUNTER — Other Ambulatory Visit: Payer: Self-pay

## 2021-09-06 ENCOUNTER — Ambulatory Visit
Admission: RE | Admit: 2021-09-06 | Discharge: 2021-09-06 | Disposition: A | Discharge status: Home / Self Care | Payer: BC Managed Care – PPO | Source: Ambulatory Visit | Attending: Radiation Oncology | Admitting: Radiation Oncology

## 2021-09-06 DIAGNOSIS — D0512 Intraductal carcinoma in situ of left breast: Secondary | ICD-10-CM | POA: Diagnosis not present

## 2021-09-06 LAB — RAD ONC ARIA SESSION SUMMARY
Course Elapsed Days: 9
Plan Fractions Treated to Date: 8
Plan Prescribed Dose Per Fraction: 1.8 Gy
Plan Total Fractions Prescribed: 28
Plan Total Prescribed Dose: 50.4 Gy
Reference Point Dosage Given to Date: 14.4 Gy
Reference Point Session Dosage Given: 1.8 Gy
Session Number: 8

## 2021-09-06 MED ORDER — RADIAPLEXRX EX GEL: Freq: Once | CUTANEOUS | Status: AC

## 2021-09-09 ENCOUNTER — Other Ambulatory Visit: Payer: Self-pay

## 2021-09-09 ENCOUNTER — Ambulatory Visit
Admission: RE | Admit: 2021-09-09 | Discharge: 2021-09-09 | Disposition: A | Discharge status: Home / Self Care | Payer: BC Managed Care – PPO | Source: Ambulatory Visit | Attending: Radiation Oncology | Admitting: Radiation Oncology

## 2021-09-09 DIAGNOSIS — D0512 Intraductal carcinoma in situ of left breast: Secondary | ICD-10-CM | POA: Diagnosis not present

## 2021-09-09 LAB — RAD ONC ARIA SESSION SUMMARY
Course Elapsed Days: 12
Plan Fractions Treated to Date: 9
Plan Prescribed Dose Per Fraction: 1.8 Gy
Plan Total Fractions Prescribed: 28
Plan Total Prescribed Dose: 50.4 Gy
Reference Point Dosage Given to Date: 16.2 Gy
Reference Point Session Dosage Given: 1.8 Gy
Session Number: 9

## 2021-09-10 ENCOUNTER — Other Ambulatory Visit: Payer: Self-pay

## 2021-09-10 ENCOUNTER — Ambulatory Visit
Admission: RE | Admit: 2021-09-10 | Discharge: 2021-09-10 | Disposition: A | Discharge status: Home / Self Care | Payer: BC Managed Care – PPO | Source: Ambulatory Visit | Attending: Radiation Oncology | Admitting: Radiation Oncology

## 2021-09-10 DIAGNOSIS — D0512 Intraductal carcinoma in situ of left breast: Secondary | ICD-10-CM | POA: Diagnosis not present

## 2021-09-10 LAB — RAD ONC ARIA SESSION SUMMARY
Course Elapsed Days: 13
Plan Fractions Treated to Date: 10
Plan Prescribed Dose Per Fraction: 1.8 Gy
Plan Total Fractions Prescribed: 28
Plan Total Prescribed Dose: 50.4 Gy
Reference Point Dosage Given to Date: 18 Gy
Reference Point Session Dosage Given: 1.8 Gy
Session Number: 10

## 2021-09-11 ENCOUNTER — Other Ambulatory Visit: Payer: Self-pay

## 2021-09-11 ENCOUNTER — Ambulatory Visit
Admission: RE | Admit: 2021-09-11 | Discharge: 2021-09-11 | Disposition: A | Discharge status: Home / Self Care | Payer: BC Managed Care – PPO | Source: Ambulatory Visit | Attending: Radiation Oncology | Admitting: Radiation Oncology

## 2021-09-11 DIAGNOSIS — D0512 Intraductal carcinoma in situ of left breast: Secondary | ICD-10-CM | POA: Diagnosis not present

## 2021-09-11 LAB — RAD ONC ARIA SESSION SUMMARY
Course Elapsed Days: 14
Plan Fractions Treated to Date: 11
Plan Prescribed Dose Per Fraction: 1.8 Gy
Plan Total Fractions Prescribed: 28
Plan Total Prescribed Dose: 50.4 Gy
Reference Point Dosage Given to Date: 19.8 Gy
Reference Point Session Dosage Given: 1.8 Gy
Session Number: 11

## 2021-09-12 ENCOUNTER — Ambulatory Visit
Admission: RE | Admit: 2021-09-12 | Discharge: 2021-09-12 | Disposition: A | Discharge status: Home / Self Care | Payer: BC Managed Care – PPO | Source: Ambulatory Visit | Attending: Radiation Oncology | Admitting: Radiation Oncology

## 2021-09-12 ENCOUNTER — Other Ambulatory Visit: Payer: Self-pay

## 2021-09-12 DIAGNOSIS — D0512 Intraductal carcinoma in situ of left breast: Secondary | ICD-10-CM | POA: Diagnosis not present

## 2021-09-12 LAB — RAD ONC ARIA SESSION SUMMARY
Course Elapsed Days: 15
Plan Fractions Treated to Date: 12
Plan Prescribed Dose Per Fraction: 1.8 Gy
Plan Total Fractions Prescribed: 28
Plan Total Prescribed Dose: 50.4 Gy
Reference Point Dosage Given to Date: 21.6 Gy
Reference Point Session Dosage Given: 1.8 Gy
Session Number: 12

## 2021-09-13 ENCOUNTER — Ambulatory Visit: Payer: BC Managed Care – PPO

## 2021-09-13 ENCOUNTER — Ambulatory Visit
Admission: RE | Admit: 2021-09-13 | Discharge: 2021-09-13 | Disposition: A | Discharge status: Home / Self Care | Payer: BC Managed Care – PPO | Source: Ambulatory Visit | Attending: Radiation Oncology | Admitting: Radiation Oncology

## 2021-09-13 ENCOUNTER — Other Ambulatory Visit: Payer: Self-pay

## 2021-09-13 DIAGNOSIS — D0512 Intraductal carcinoma in situ of left breast: Secondary | ICD-10-CM | POA: Diagnosis not present

## 2021-09-13 LAB — RAD ONC ARIA SESSION SUMMARY
Course Elapsed Days: 16
Plan Fractions Treated to Date: 13
Plan Prescribed Dose Per Fraction: 1.8 Gy
Plan Total Fractions Prescribed: 28
Plan Total Prescribed Dose: 50.4 Gy
Reference Point Dosage Given to Date: 23.4 Gy
Reference Point Session Dosage Given: 1.8 Gy
Session Number: 13

## 2021-09-16 ENCOUNTER — Other Ambulatory Visit: Payer: Self-pay

## 2021-09-16 ENCOUNTER — Ambulatory Visit
Admission: RE | Admit: 2021-09-16 | Discharge: 2021-09-16 | Disposition: A | Discharge status: Home / Self Care | Payer: BC Managed Care – PPO | Source: Ambulatory Visit | Attending: Radiation Oncology | Admitting: Radiation Oncology

## 2021-09-16 DIAGNOSIS — D0512 Intraductal carcinoma in situ of left breast: Secondary | ICD-10-CM | POA: Diagnosis not present

## 2021-09-16 LAB — RAD ONC ARIA SESSION SUMMARY
Course Elapsed Days: 19
Plan Fractions Treated to Date: 14
Plan Prescribed Dose Per Fraction: 1.8 Gy
Plan Total Fractions Prescribed: 28
Plan Total Prescribed Dose: 50.4 Gy
Reference Point Dosage Given to Date: 25.2 Gy
Reference Point Session Dosage Given: 1.8 Gy
Session Number: 14

## 2021-09-17 ENCOUNTER — Ambulatory Visit (INDEPENDENT_AMBULATORY_CARE_PROVIDER_SITE_OTHER): Payer: BC Managed Care – PPO | Admitting: Physician Assistant

## 2021-09-17 ENCOUNTER — Encounter: Payer: Self-pay | Admitting: Student

## 2021-09-17 ENCOUNTER — Ambulatory Visit
Admission: RE | Admit: 2021-09-17 | Discharge: 2021-09-17 | Disposition: A | Discharge status: Home / Self Care | Payer: BC Managed Care – PPO | Source: Ambulatory Visit | Attending: Radiation Oncology | Admitting: Radiation Oncology

## 2021-09-17 ENCOUNTER — Other Ambulatory Visit: Payer: Self-pay

## 2021-09-17 DIAGNOSIS — Z9889 Other specified postprocedural states: Secondary | ICD-10-CM

## 2021-09-17 DIAGNOSIS — D0512 Intraductal carcinoma in situ of left breast: Secondary | ICD-10-CM | POA: Diagnosis not present

## 2021-09-17 LAB — RAD ONC ARIA SESSION SUMMARY
Course Elapsed Days: 20
Plan Fractions Treated to Date: 15
Plan Prescribed Dose Per Fraction: 1.8 Gy
Plan Total Fractions Prescribed: 28
Plan Total Prescribed Dose: 50.4 Gy
Reference Point Dosage Given to Date: 27 Gy
Reference Point Session Dosage Given: 1.8 Gy
Session Number: 15

## 2021-09-17 NOTE — Progress Notes (Signed)
Patient is a pleasant 46 year old female with PMH of left-sided DCIS s/p partial mastectomy performed 06/17/2021 now s/p bilateral oncoplastic reduction and mastopexy performed 07/24/2021 by Dr. Marla Roe presents to clinic for postoperative follow-up.  She was last seen here in clinic on 09/03/2021.  At that time, she was in the midst of radiation.  She had noted that the left NAC appeared darker than the right NAC.  On exam, there were each viable.  Low suspicion for necrosis.  Suspected to be related to the radiation.  Slight hyperpigmentation over left NAC was also noted at time of initial postop visit 08/02/2021.  Discussed scar creams.  Today, patient states that she is mildly rundown from all of her radiation treatments.  She anticipates another 4 weeks of RT before she is complete.  In the past week or 2, she has noticed considerable redness and bruising over left breast from her radiation treatment.  They have informed her that they will be following her closely and adjust as needed.  She tells me that while her left NAC was slightly darker at her initial postop visit, it improved with Aquaphor daily.  Patient notes that it was trending back towards its normal color until she initiated radiation at which point it has since gotten considerably darker.  She is using moisturizing agents as directed by RT.  On physical exam, left breast is considerably erythematous compared to the right.  It is soft to touch.  No induration.  No fluctuance or crepitus.  No subcutaneous fluid collections noted.  Clear radiation changes noted throughout.  Left NAC is darker than the right, but well-healed and viable.  From a oncoplastic reduction and mastopexy standpoint, she appears to be well-healed.  Unfortunately her postoperative recovery has been complicated by RT.  Suspect that her darkened left areola is attributable to the radiation given her history provided.  She denies any personal history of keloiding and plans  to initiate scar mitigation creams as soon as RT is complete.  She can start them on the right side at any time.  No wounds noted.  Well-healed.  Recommended that she follow-up for consult in 6 months to ensure that she is satisfied.  She will call the clinic should she have any questions or concerns in the interim.

## 2021-09-18 ENCOUNTER — Other Ambulatory Visit: Payer: Self-pay

## 2021-09-18 ENCOUNTER — Ambulatory Visit
Admission: RE | Admit: 2021-09-18 | Discharge: 2021-09-18 | Disposition: A | Discharge status: Home / Self Care | Payer: BC Managed Care – PPO | Source: Ambulatory Visit | Attending: Radiation Oncology | Admitting: Radiation Oncology

## 2021-09-18 DIAGNOSIS — D0512 Intraductal carcinoma in situ of left breast: Secondary | ICD-10-CM | POA: Diagnosis not present

## 2021-09-18 LAB — RAD ONC ARIA SESSION SUMMARY
Course Elapsed Days: 21
Plan Fractions Treated to Date: 16
Plan Prescribed Dose Per Fraction: 1.8 Gy
Plan Total Fractions Prescribed: 28
Plan Total Prescribed Dose: 50.4 Gy
Reference Point Dosage Given to Date: 28.8 Gy
Reference Point Session Dosage Given: 1.8 Gy
Session Number: 16

## 2021-09-19 ENCOUNTER — Ambulatory Visit
Admission: RE | Admit: 2021-09-19 | Discharge: 2021-09-19 | Disposition: A | Discharge status: Home / Self Care | Payer: BC Managed Care – PPO | Source: Ambulatory Visit | Attending: Radiation Oncology | Admitting: Radiation Oncology

## 2021-09-19 ENCOUNTER — Other Ambulatory Visit: Payer: Self-pay

## 2021-09-19 DIAGNOSIS — D0512 Intraductal carcinoma in situ of left breast: Secondary | ICD-10-CM | POA: Diagnosis not present

## 2021-09-19 LAB — RAD ONC ARIA SESSION SUMMARY
Course Elapsed Days: 22
Plan Fractions Treated to Date: 17
Plan Prescribed Dose Per Fraction: 1.8 Gy
Plan Total Fractions Prescribed: 28
Plan Total Prescribed Dose: 50.4 Gy
Reference Point Dosage Given to Date: 30.6 Gy
Reference Point Session Dosage Given: 1.8 Gy
Session Number: 17

## 2021-09-20 ENCOUNTER — Ambulatory Visit
Admission: RE | Admit: 2021-09-20 | Discharge: 2021-09-20 | Disposition: A | Discharge status: Home / Self Care | Payer: BC Managed Care – PPO | Source: Ambulatory Visit | Attending: Radiation Oncology | Admitting: Radiation Oncology

## 2021-09-20 ENCOUNTER — Other Ambulatory Visit: Payer: Self-pay

## 2021-09-20 DIAGNOSIS — D0512 Intraductal carcinoma in situ of left breast: Secondary | ICD-10-CM | POA: Diagnosis not present

## 2021-09-20 LAB — RAD ONC ARIA SESSION SUMMARY
Course Elapsed Days: 23
Plan Fractions Treated to Date: 18
Plan Prescribed Dose Per Fraction: 1.8 Gy
Plan Total Fractions Prescribed: 28
Plan Total Prescribed Dose: 50.4 Gy
Reference Point Dosage Given to Date: 32.4 Gy
Reference Point Session Dosage Given: 1.8 Gy
Session Number: 18

## 2021-09-23 ENCOUNTER — Ambulatory Visit
Admission: RE | Admit: 2021-09-23 | Discharge: 2021-09-23 | Disposition: A | Discharge status: Home / Self Care | Payer: BC Managed Care – PPO | Source: Ambulatory Visit | Attending: Radiation Oncology | Admitting: Radiation Oncology

## 2021-09-23 ENCOUNTER — Other Ambulatory Visit: Payer: Self-pay

## 2021-09-23 DIAGNOSIS — D0512 Intraductal carcinoma in situ of left breast: Secondary | ICD-10-CM | POA: Insufficient documentation

## 2021-09-23 LAB — RAD ONC ARIA SESSION SUMMARY
Course Elapsed Days: 26
Plan Fractions Treated to Date: 19
Plan Prescribed Dose Per Fraction: 1.8 Gy
Plan Total Fractions Prescribed: 28
Plan Total Prescribed Dose: 50.4 Gy
Reference Point Dosage Given to Date: 34.2 Gy
Reference Point Session Dosage Given: 1.8 Gy
Session Number: 19

## 2021-09-25 ENCOUNTER — Other Ambulatory Visit: Payer: Self-pay

## 2021-09-25 ENCOUNTER — Ambulatory Visit
Admission: RE | Admit: 2021-09-25 | Discharge: 2021-09-25 | Disposition: A | Discharge status: Home / Self Care | Payer: BC Managed Care – PPO | Source: Ambulatory Visit | Attending: Radiation Oncology | Admitting: Radiation Oncology

## 2021-09-25 DIAGNOSIS — D0512 Intraductal carcinoma in situ of left breast: Secondary | ICD-10-CM | POA: Diagnosis not present

## 2021-09-25 LAB — RAD ONC ARIA SESSION SUMMARY
Course Elapsed Days: 28
Plan Fractions Treated to Date: 20
Plan Prescribed Dose Per Fraction: 1.8 Gy
Plan Total Fractions Prescribed: 28
Plan Total Prescribed Dose: 50.4 Gy
Reference Point Dosage Given to Date: 36 Gy
Reference Point Session Dosage Given: 1.8 Gy
Session Number: 20

## 2021-09-26 ENCOUNTER — Ambulatory Visit
Admission: RE | Admit: 2021-09-26 | Discharge: 2021-09-26 | Disposition: A | Discharge status: Home / Self Care | Payer: BC Managed Care – PPO | Source: Ambulatory Visit | Attending: Radiation Oncology | Admitting: Radiation Oncology

## 2021-09-26 ENCOUNTER — Other Ambulatory Visit: Payer: Self-pay

## 2021-09-26 DIAGNOSIS — D0512 Intraductal carcinoma in situ of left breast: Secondary | ICD-10-CM | POA: Diagnosis not present

## 2021-09-26 LAB — RAD ONC ARIA SESSION SUMMARY
Course Elapsed Days: 29
Plan Fractions Treated to Date: 21
Plan Prescribed Dose Per Fraction: 1.8 Gy
Plan Total Fractions Prescribed: 28
Plan Total Prescribed Dose: 50.4 Gy
Reference Point Dosage Given to Date: 37.8 Gy
Reference Point Session Dosage Given: 1.8 Gy
Session Number: 21

## 2021-09-27 ENCOUNTER — Ambulatory Visit: Payer: BC Managed Care – PPO

## 2021-09-30 ENCOUNTER — Other Ambulatory Visit: Payer: Self-pay

## 2021-09-30 ENCOUNTER — Ambulatory Visit
Admission: RE | Admit: 2021-09-30 | Discharge: 2021-09-30 | Disposition: A | Discharge status: Home / Self Care | Payer: BC Managed Care – PPO | Source: Ambulatory Visit | Attending: Radiation Oncology | Admitting: Radiation Oncology

## 2021-09-30 DIAGNOSIS — D0512 Intraductal carcinoma in situ of left breast: Secondary | ICD-10-CM | POA: Diagnosis not present

## 2021-09-30 LAB — RAD ONC ARIA SESSION SUMMARY
Course Elapsed Days: 33
Plan Fractions Treated to Date: 22
Plan Prescribed Dose Per Fraction: 1.8 Gy
Plan Total Fractions Prescribed: 28
Plan Total Prescribed Dose: 50.4 Gy
Reference Point Dosage Given to Date: 39.6 Gy
Reference Point Session Dosage Given: 1.8 Gy
Session Number: 22

## 2021-10-01 ENCOUNTER — Ambulatory Visit
Admission: RE | Admit: 2021-10-01 | Discharge: 2021-10-01 | Disposition: A | Discharge status: Home / Self Care | Payer: BC Managed Care – PPO | Source: Ambulatory Visit | Attending: Radiation Oncology | Admitting: Radiation Oncology

## 2021-10-01 ENCOUNTER — Other Ambulatory Visit: Payer: Self-pay

## 2021-10-01 DIAGNOSIS — D0512 Intraductal carcinoma in situ of left breast: Secondary | ICD-10-CM | POA: Diagnosis not present

## 2021-10-01 LAB — RAD ONC ARIA SESSION SUMMARY
Course Elapsed Days: 34
Plan Fractions Treated to Date: 23
Plan Prescribed Dose Per Fraction: 1.8 Gy
Plan Total Fractions Prescribed: 28
Plan Total Prescribed Dose: 50.4 Gy
Reference Point Dosage Given to Date: 41.4 Gy
Reference Point Session Dosage Given: 1.8 Gy
Session Number: 23

## 2021-10-02 ENCOUNTER — Ambulatory Visit
Admission: RE | Admit: 2021-10-02 | Discharge: 2021-10-02 | Disposition: A | Discharge status: Home / Self Care | Payer: BC Managed Care – PPO | Source: Ambulatory Visit | Attending: Radiation Oncology | Admitting: Radiation Oncology

## 2021-10-02 ENCOUNTER — Other Ambulatory Visit: Payer: Self-pay

## 2021-10-02 DIAGNOSIS — D0512 Intraductal carcinoma in situ of left breast: Secondary | ICD-10-CM | POA: Diagnosis not present

## 2021-10-02 LAB — RAD ONC ARIA SESSION SUMMARY
Course Elapsed Days: 35
Plan Fractions Treated to Date: 24
Plan Prescribed Dose Per Fraction: 1.8 Gy
Plan Total Fractions Prescribed: 28
Plan Total Prescribed Dose: 50.4 Gy
Reference Point Dosage Given to Date: 43.2 Gy
Reference Point Session Dosage Given: 1.8 Gy
Session Number: 24

## 2021-10-03 ENCOUNTER — Other Ambulatory Visit: Payer: Self-pay

## 2021-10-03 ENCOUNTER — Encounter: Payer: Self-pay | Admitting: Hematology and Oncology

## 2021-10-03 ENCOUNTER — Inpatient Hospital Stay: Payer: BC Managed Care – PPO | Attending: Genetic Counselor | Admitting: Hematology and Oncology

## 2021-10-03 ENCOUNTER — Ambulatory Visit
Admission: RE | Admit: 2021-10-03 | Discharge: 2021-10-03 | Disposition: A | Discharge status: Home / Self Care | Payer: BC Managed Care – PPO | Source: Ambulatory Visit | Attending: Radiation Oncology | Admitting: Radiation Oncology

## 2021-10-03 DIAGNOSIS — Z170 Estrogen receptor positive status [ER+]: Secondary | ICD-10-CM | POA: Diagnosis not present

## 2021-10-03 DIAGNOSIS — D0512 Intraductal carcinoma in situ of left breast: Secondary | ICD-10-CM | POA: Diagnosis not present

## 2021-10-03 LAB — RAD ONC ARIA SESSION SUMMARY
Course Elapsed Days: 36
Plan Fractions Treated to Date: 25
Plan Prescribed Dose Per Fraction: 1.8 Gy
Plan Total Fractions Prescribed: 28
Plan Total Prescribed Dose: 50.4 Gy
Reference Point Dosage Given to Date: 45 Gy
Reference Point Session Dosage Given: 1.8 Gy
Session Number: 25

## 2021-10-03 MED ORDER — RADIAPLEXRX EX GEL: Freq: Once | CUTANEOUS | Status: AC

## 2021-10-03 NOTE — Progress Notes (Signed)
[place] [facility] NOTE  Patient Care Team: Mckinley Jewel, [name], [name], [name], [name], [name], MD as Consulting Physician (Radiation Oncology)  CHIEF COMPLAINTS/PURPOSE OF CONSULTATION:  Newly diagnosed breast cancer  HISTORY OF PRESENTING ILLNESS:  Joan Dalton 46 y.o. female is here because of recent diagnosis of left breast cancer  I reviewed her records extensively and collaborated the history with the patient.  SUMMARY OF ONCOLOGIC HISTORY: Oncology History  Ductal carcinoma in situ (DCIS) of left breast  05/20/2021 Initial Diagnosis   Ductal carcinoma in situ (DCIS) of left breast   06/05/2021 Genetic Testing   Negative genetic testing on the CancerNext-Expanded+RNAinsight panel.  The report date is June 05, 2021.  The CancerNext-Expanded gene panel offered by [facility] and includes sequencing and rearrangement analysis for the following 77 genes: AIP, ALK, APC*, ATM*, AXIN2, BAP1, BARD1, BLM, BMPR1A, BRCA1*, BRCA2*, BRIP1*, CDC73, CDH1*, CDK4, CDKN1B, CDKN2A, CHEK2*, CTNNA1, DICER1, FANCC, FH, FLCN, GALNT12, KIF1B, LZTR1, MAX, MEN1, MET, MLH1*, MSH2*, MSH3, MSH6*, MUTYH*, NBN, NF1*, NF2, NTHL1, PALB2*, PHOX2B, PMS2*, POT1, PRKAR1A, PTCH1, PTEN*, RAD51C*, RAD51D*, RB1, RECQL, RET, SDHA, SDHAF2, SDHB, SDHC, SDHD, SMAD4, SMARCA4, SMARCB1, SMARCE1, STK11, SUFU, TMEM127, TP53*, TSC1, TSC2, VHL and XRCC2 (sequencing and deletion/duplication); EGFR, EGLN1, HOXB13, KIT, MITF, PDGFRA, POLD1, and POLE (sequencing only); EPCAM and GREM1 (deletion/duplication only). DNA and RNA analyses performed for * genes.    06/17/2021 Surgery   Patient had left breast lumpectomy which showed high-grade ductal carcinoma in situ with  necrosis and calcifications, resection margins are negative for carcinoma left additional superior margin excision again negative for carcinoma.  She had 3 out of 3 lymph nodes without involvement.  Prior prognostic showed ER 85% positive strong staining PR 95% positive strong staining. She is now undergoing plastic surgery, had right and left breast mammoplasty with no evidence of malignancy.    She has a follow-up with radiation oncology 08/21/2021.   We calculated DCIS nomogram from [facility] which showed 5-year risk of recurrence of about 3 to 4% She is here for follow-up, telehealth visit.  She is now undergoing adjuvant radiation, this is going well except for an episode of hemorrhoidal bleeding which has since resolved.  She also feels very tired and has some ongoing skin changes from radiation.  She denies any other complaints today. Rest of the pertinent 10 point ROS reviewed and negative  MEDICAL HISTORY:  Past Medical History:  Diagnosis Date   Breast cancer ([place]) 05/08/2021   Family history of breast cancer    Family history of colon cancer    Family history of pancreatic cancer     SURGICAL HISTORY: Past Surgical History:  Procedure Laterality Date   AXILLARY SENTINEL NODE BIOPSY Left 06/17/2021   Procedure: LEFT AXILLARY SENTINEL NODE BIOPSY;  Surgeon: Jovita Kussmaul, MD;  Location: [place];  Service: General;  Laterality: Left;   BREAST BIOPSY Left 05/08/2021   BREAST LUMPECTOMY Right 10/1998   BREAST LUMPECTOMY WITH RADIOACTIVE SEED AND SENTINEL LYMPH NODE BIOPSY Left 06/17/2021   Procedure: LEFT BREAST LUMPECTOMY WITH RADIOACTIVE SEEDS x 2;  Surgeon: Jovita Kussmaul, MD;  Location: [place];  Service: General;  Laterality: Left;   BREAST REDUCTION SURGERY Bilateral 07/24/2021   Procedure: BILATERAL ONCOPLASTIC BREAST REDUCTION;  Surgeon: Audelia Hives  S, DO;  Location: [place];  Service: Plastics;  Laterality: Bilateral;   MASTOPEXY Bilateral 07/24/2021   Procedure: MASTOPEXY;   Surgeon: Wallace Going, DO;  Location: [place];  Service: Plastics;  Laterality: Bilateral;    SOCIAL HISTORY: Social History   Socioeconomic History   Marital status: Married    Spouse name: NERI VIEYRA II   Number of children: 5   Years of education: Not on file   Highest education level: Not on file  Occupational History   Not on file  Tobacco Use   Smoking status: Never   Smokeless tobacco: Never  Vaping Use   Vaping Use: Never used  Substance and Sexual Activity   Alcohol use: Never   Drug use: Never   Sexual activity: Yes  Other Topics Concern   Not on file  Social History Narrative   Not on file   Social Determinants of Health   Financial Resource Strain: Not on file  Food Insecurity: Not on file  Transportation Needs: Not on file  Physical Activity: Not on file  Stress: Not on file  Social Connections: Not on file  Intimate Partner Violence: Not At Risk (05/21/2021)   Humiliation, Afraid, Rape, and Kick questionnaire    Fear of Current or Ex-Partner: No    Emotionally Abused: No    Physically Abused: No    Sexually Abused: No    FAMILY HISTORY: Family History  Problem Relation Age of Onset   Breast cancer Mother        67s & 61s   Prostate cancer Father    Breast cancer Maternal Aunt 75   Pancreatic cancer Maternal Aunt 75   Breast cancer Maternal Aunt        later 30s-early 52s   Colon cancer Maternal Aunt 42   Colon cancer Maternal Uncle        60s   Colon cancer Maternal Grandfather    Pancreatic cancer Cousin        mother's maternal first cousin   Pancreatic cancer Cousin        mother's maternal first cousin    ALLERGIES:  is allergic to sulfamethoxazole-trimethoprim.  MEDICATIONS:  Current Outpatient Medications  Medication Sig Dispense Refill   acetaminophen (TYLENOL) 500 MG tablet Take 1,000 mg by mouth every 6 (six) hours as needed for moderate pain or headache.     COD LIVER OIL PO Take 5 mLs by mouth 3 (three) times a  week.     enoxaparin (LOVENOX) 40 MG/0.4ML injection Inject 0.4 mLs (40 mg total) into the skin daily for 7 days. 2.8 mL 0   ferrous sulfate 325 (65 FE) MG tablet Take 325 mg by mouth daily in the afternoon.     ibuprofen (ADVIL) 200 MG tablet Take 400 mg by mouth every 6 (six) hours as needed for moderate pain.     Multiple Vitamin (MULTI-VITAMIN) tablet Take 1 tablet by mouth daily.     ondansetron (ZOFRAN-ODT) 4 MG disintegrating tablet Take 1 tablet (4 mg total) by mouth every 8 (eight) hours as needed for nausea or vomiting. 20 tablet 0   oxyCODONE (ROXICODONE) 5 MG immediate release tablet Take 1 tablet (5 mg total) by mouth every 6 (six) hours as needed for severe pain. 15 tablet 0   VITAMIN D PO Take 1 capsule by mouth daily.     No current facility-administered medications for this visit.     PHYSICAL EXAMINATION: ECOG PERFORMANCE STATUS: 0 - Asymptomatic  There were no vitals filed for this visit.  There were no vitals filed for this visit.  Physical exam deferred today, telehealth visit  LABORATORY DATA:  I have reviewed the data as listed Lab Results  Component Value Date   WBC 6.1 07/22/2021   HGB 11.9 (L) 07/22/2021   HCT 36.0 07/22/2021   MCV 95.0 07/22/2021   PLT 237 07/22/2021   Lab Results  Component Value Date   NA 140 06/12/2021   K 4.0 06/12/2021   CL 104 06/12/2021   CO2 26 06/12/2021    RADIOGRAPHIC STUDIES: I have personally reviewed the radiological reports and agreed with the findings in the report.  ASSESSMENT AND PLAN:   This is a very pleasant 46 year old female patient (menopausal status unknown, last period Several years ago but had endometrial ablation) referred to hematology/oncology for DCIS of the right breast and recommendations regarding the above.  We have discussed the following findings about DCIS.  We have once again today reviewed her pathology which showed ductal carcinoma in situ, calculated her risk of recurrence based on  [facility] nomogram.  She has 8 more treatments for adjuvant radiation She is not sure about menopause since she had endometrial ablation.  We have in the past as well as today discussed about considering aromatase inhibitors with ovarian suppression unless we can demonstrate menopause on multiple occasions.  When she comes for her next visit we will try to get [facility] and [place] drawn, serum estradiol to look at menopausal status however this may not be entirely reliable to confirm menopause.  She had a history of unprovoked PE hence I am reluctant to try tamoxifen. She is agreeable to all these recommendations She was satisfied with all the answers, return to clinic in 3 weeks  I connected with  Belanna Karl Ito on 10/03/21 by a video enabled telemedicine application and verified that I am speaking with the correct person using two identifiers.   I discussed the limitations of evaluation and management by telemedicine. The patient expressed understanding and agreed to proceed.   Total time spent: 20 minutes  Thank you for consulting [place] in the care of this patient.  Please not hesitate to contact us with any additional questions or concerns.

## 2021-10-04 ENCOUNTER — Ambulatory Visit: Payer: BC Managed Care – PPO | Admitting: Radiation Oncology

## 2021-10-04 ENCOUNTER — Ambulatory Visit: Payer: BC Managed Care – PPO

## 2021-10-07 ENCOUNTER — Other Ambulatory Visit: Payer: Self-pay

## 2021-10-07 ENCOUNTER — Ambulatory Visit
Admission: RE | Admit: 2021-10-07 | Discharge: 2021-10-07 | Disposition: A | Discharge status: Home / Self Care | Payer: BC Managed Care – PPO | Source: Ambulatory Visit | Attending: Radiation Oncology | Admitting: Radiation Oncology

## 2021-10-07 DIAGNOSIS — D0512 Intraductal carcinoma in situ of left breast: Secondary | ICD-10-CM | POA: Diagnosis not present

## 2021-10-07 LAB — RAD ONC ARIA SESSION SUMMARY
Course Elapsed Days: 40
Plan Fractions Treated to Date: 26
Plan Prescribed Dose Per Fraction: 1.8 Gy
Plan Total Fractions Prescribed: 28
Plan Total Prescribed Dose: 50.4 Gy
Reference Point Dosage Given to Date: 46.8 Gy
Reference Point Session Dosage Given: 1.8 Gy
Session Number: 26

## 2021-10-08 ENCOUNTER — Ambulatory Visit: Payer: BC Managed Care – PPO

## 2021-10-08 ENCOUNTER — Other Ambulatory Visit: Payer: Self-pay

## 2021-10-08 DIAGNOSIS — D0512 Intraductal carcinoma in situ of left breast: Secondary | ICD-10-CM | POA: Diagnosis not present

## 2021-10-08 LAB — RAD ONC ARIA SESSION SUMMARY
Course Elapsed Days: 41
Plan Fractions Treated to Date: 27
Plan Prescribed Dose Per Fraction: 1.8 Gy
Plan Total Fractions Prescribed: 28
Plan Total Prescribed Dose: 50.4 Gy
Reference Point Dosage Given to Date: 48.6 Gy
Reference Point Session Dosage Given: 1.8 Gy
Session Number: 27

## 2021-10-09 ENCOUNTER — Other Ambulatory Visit: Payer: Self-pay

## 2021-10-09 ENCOUNTER — Ambulatory Visit
Admission: RE | Admit: 2021-10-09 | Discharge: 2021-10-09 | Disposition: A | Discharge status: Home / Self Care | Payer: BC Managed Care – PPO | Source: Ambulatory Visit | Attending: Radiation Oncology | Admitting: Radiation Oncology

## 2021-10-09 DIAGNOSIS — D0512 Intraductal carcinoma in situ of left breast: Secondary | ICD-10-CM | POA: Diagnosis not present

## 2021-10-09 LAB — RAD ONC ARIA SESSION SUMMARY
Course Elapsed Days: 42
Plan Fractions Treated to Date: 28
Plan Prescribed Dose Per Fraction: 1.8 Gy
Plan Total Fractions Prescribed: 28
Plan Total Prescribed Dose: 50.4 Gy
Reference Point Dosage Given to Date: 50.4 Gy
Reference Point Session Dosage Given: 1.8 Gy
Session Number: 28

## 2021-10-10 ENCOUNTER — Other Ambulatory Visit: Payer: Self-pay

## 2021-10-10 ENCOUNTER — Ambulatory Visit: Payer: BC Managed Care – PPO

## 2021-10-10 DIAGNOSIS — D0512 Intraductal carcinoma in situ of left breast: Secondary | ICD-10-CM | POA: Diagnosis not present

## 2021-10-10 LAB — RAD ONC ARIA SESSION SUMMARY
Course Elapsed Days: 43
Plan Fractions Treated to Date: 1
Plan Prescribed Dose Per Fraction: 2 Gy
Plan Total Fractions Prescribed: 5
Plan Total Prescribed Dose: 10 Gy
Reference Point Dosage Given to Date: 52.4 Gy
Reference Point Session Dosage Given: 2 Gy
Session Number: 29

## 2021-10-11 ENCOUNTER — Other Ambulatory Visit: Payer: Self-pay

## 2021-10-11 ENCOUNTER — Ambulatory Visit
Admission: RE | Admit: 2021-10-11 | Discharge: 2021-10-11 | Disposition: A | Discharge status: Home / Self Care | Payer: BC Managed Care – PPO | Source: Ambulatory Visit | Attending: Radiation Oncology | Admitting: Radiation Oncology

## 2021-10-11 DIAGNOSIS — D0512 Intraductal carcinoma in situ of left breast: Secondary | ICD-10-CM | POA: Diagnosis not present

## 2021-10-11 LAB — RAD ONC ARIA SESSION SUMMARY
Course Elapsed Days: 44
Plan Fractions Treated to Date: 2
Plan Prescribed Dose Per Fraction: 2 Gy
Plan Total Fractions Prescribed: 5
Plan Total Prescribed Dose: 10 Gy
Reference Point Dosage Given to Date: 54.4 Gy
Reference Point Session Dosage Given: 2 Gy
Session Number: 30

## 2021-10-14 ENCOUNTER — Ambulatory Visit: Payer: BC Managed Care – PPO

## 2021-10-14 ENCOUNTER — Other Ambulatory Visit: Payer: Self-pay

## 2021-10-14 ENCOUNTER — Encounter: Payer: Self-pay | Admitting: *Deleted

## 2021-10-14 ENCOUNTER — Ambulatory Visit
Admission: RE | Admit: 2021-10-14 | Discharge: 2021-10-14 | Disposition: A | Discharge status: Home / Self Care | Payer: BC Managed Care – PPO | Source: Ambulatory Visit | Attending: Radiation Oncology | Admitting: Radiation Oncology

## 2021-10-14 DIAGNOSIS — D0512 Intraductal carcinoma in situ of left breast: Secondary | ICD-10-CM | POA: Diagnosis not present

## 2021-10-14 LAB — RAD ONC ARIA SESSION SUMMARY
Course Elapsed Days: 47
Plan Fractions Treated to Date: 3
Plan Prescribed Dose Per Fraction: 2 Gy
Plan Total Fractions Prescribed: 5
Plan Total Prescribed Dose: 10 Gy
Reference Point Dosage Given to Date: 56.4 Gy
Reference Point Session Dosage Given: 2 Gy
Session Number: 31

## 2021-10-14 MED ORDER — RADIAPLEXRX EX GEL: Freq: Once | CUTANEOUS | Status: AC

## 2021-10-15 ENCOUNTER — Ambulatory Visit
Admission: RE | Admit: 2021-10-15 | Discharge: 2021-10-15 | Disposition: A | Discharge status: Home / Self Care | Payer: BC Managed Care – PPO | Source: Ambulatory Visit | Attending: Radiation Oncology | Admitting: Radiation Oncology

## 2021-10-15 ENCOUNTER — Other Ambulatory Visit: Payer: Self-pay

## 2021-10-15 ENCOUNTER — Telehealth: Payer: Self-pay | Admitting: Hematology and Oncology

## 2021-10-15 DIAGNOSIS — D0512 Intraductal carcinoma in situ of left breast: Secondary | ICD-10-CM | POA: Diagnosis not present

## 2021-10-15 LAB — RAD ONC ARIA SESSION SUMMARY
Course Elapsed Days: 48
Plan Fractions Treated to Date: 4
Plan Prescribed Dose Per Fraction: 2 Gy
Plan Total Fractions Prescribed: 5
Plan Total Prescribed Dose: 10 Gy
Reference Point Dosage Given to Date: 58.4 Gy
Reference Point Session Dosage Given: 2 Gy
Session Number: 32

## 2021-10-15 NOTE — Telephone Encounter (Signed)
.  Called patient to schedule appointment per 7/24 inbasket, patient is aware of date and time.   

## 2021-10-16 ENCOUNTER — Ambulatory Visit
Admission: RE | Admit: 2021-10-16 | Discharge: 2021-10-16 | Disposition: A | Discharge status: Home / Self Care | Payer: BC Managed Care – PPO | Source: Ambulatory Visit | Attending: Radiation Oncology | Admitting: Radiation Oncology

## 2021-10-16 ENCOUNTER — Encounter: Payer: Self-pay | Admitting: Radiation Oncology

## 2021-10-16 ENCOUNTER — Ambulatory Visit: Payer: BC Managed Care – PPO

## 2021-10-16 ENCOUNTER — Other Ambulatory Visit: Payer: Self-pay

## 2021-10-16 DIAGNOSIS — D0512 Intraductal carcinoma in situ of left breast: Secondary | ICD-10-CM | POA: Diagnosis not present

## 2021-10-16 LAB — RAD ONC ARIA SESSION SUMMARY
Course Elapsed Days: 49
Plan Fractions Treated to Date: 5
Plan Prescribed Dose Per Fraction: 2 Gy
Plan Total Fractions Prescribed: 5
Plan Total Prescribed Dose: 10 Gy
Reference Point Dosage Given to Date: 60.4 Gy
Reference Point Session Dosage Given: 2 Gy
Session Number: 33

## 2021-10-17 NOTE — Progress Notes (Signed)
                                                                                                                                                             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 She developed fatigue and anticipated skin changes in the treatment field.   Plan: The patient will receive a call in about one month from the radiation oncology department. She will continue follow up with Dr. Chryl Heck as well.   ________________________________________________    Carola Rhine, [facility]

## 2021-10-18 DIAGNOSIS — Z0289 Encounter for other administrative examinations: Secondary | ICD-10-CM

## 2021-10-30 ENCOUNTER — Inpatient Hospital Stay: Payer: BC Managed Care – PPO | Attending: Genetic Counselor | Admitting: Hematology and Oncology

## 2021-10-30 ENCOUNTER — Other Ambulatory Visit: Payer: Self-pay

## 2021-10-30 ENCOUNTER — Encounter: Payer: Self-pay | Admitting: Hematology and Oncology

## 2021-10-30 ENCOUNTER — Inpatient Hospital Stay: Payer: BC Managed Care – PPO

## 2021-10-30 VITALS — BP 146/95 | HR 94 | Temp 97.7°F | Resp 18 | Ht 66.0 in | Wt 277.1 lb

## 2021-10-30 DIAGNOSIS — Z923 Personal history of irradiation: Secondary | ICD-10-CM | POA: Insufficient documentation

## 2021-10-30 DIAGNOSIS — Z7901 Long term (current) use of anticoagulants: Secondary | ICD-10-CM | POA: Insufficient documentation

## 2021-10-30 DIAGNOSIS — D0511 Intraductal carcinoma in situ of right breast: Secondary | ICD-10-CM | POA: Insufficient documentation

## 2021-10-30 DIAGNOSIS — D0512 Intraductal carcinoma in situ of left breast: Secondary | ICD-10-CM

## 2021-10-30 DIAGNOSIS — Z86711 Personal history of pulmonary embolism: Secondary | ICD-10-CM | POA: Diagnosis not present

## 2021-10-30 DIAGNOSIS — Z79899 Other long term (current) drug therapy: Secondary | ICD-10-CM | POA: Diagnosis not present

## 2021-10-30 NOTE — Progress Notes (Unsigned)
[name], [name], [name], [name], [name], [name], [name]  I reviewed her records extensively and collaborated the history with the patient.  SUMMARY OF ONCOLOGIC HISTORY: Oncology History  Ductal carcinoma in situ (DCIS) of left breast  05/20/2021 Initial Diagnosis   Ductal carcinoma in situ (DCIS) of left breast   06/05/2021 Genetic Testing   Negative genetic testing on the CancerNext-Expanded+RNAinsight panel.  The report date is June 05, 2021.  The CancerNext-Expanded gene panel offered by [facility] and includes sequencing and rearrangement analysis for the following 77 genes: AIP, ALK, APC*, ATM*, AXIN2, BAP1, BARD1, BLM, BMPR1A, BRCA1*, BRCA2*, BRIP1*, CDC73, CDH1*, CDK4, CDKN1B, CDKN2A, CHEK2*, CTNNA1, DICER1, FANCC, FH, FLCN, GALNT12, KIF1B, LZTR1, MAX, MEN1, MET, MLH1*, MSH2*, MSH3, MSH6*, MUTYH*, NBN, NF1*, NF2, NTHL1, PALB2*, PHOX2B, PMS2*, POT1, PRKAR1A, PTCH1, PTEN*, RAD51C*, RAD51D*, RB1, RECQL, RET, SDHA, SDHAF2, SDHB, SDHC, SDHD, SMAD4, SMARCA4, SMARCB1, SMARCE1, STK11, SUFU, TMEM127, TP53*, TSC1, TSC2, VHL and XRCC2 (sequencing and deletion/duplication); EGFR, EGLN1, HOXB13, KIT, MITF, PDGFRA, POLD1, and POLE (sequencing only); EPCAM and GREM1 (deletion/duplication only). DNA and RNA analyses performed for * genes.    06/17/2021 Surgery   Patient had left breast lumpectomy which showed high-grade ductal carcinoma in situ with  necrosis and calcifications, resection margins are negative for carcinoma left additional superior margin excision again negative for carcinoma.  She had 3 out of 3 lymph nodes without involvement.  Prior prognostic showed ER 85% positive strong staining PR 95% positive strong staining. She is now undergoing plastic surgery, had right and left breast mammoplasty with no evidence of malignancy.    She has a follow-up with radiation oncology 08/21/2021.   We calculated DCIS nomogram from [facility] which showed 5-year risk of recurrence of about 3 to 4% She is here for follow-up, telehealth visit.  She is now undergoing adjuvant radiation, this is going well except for an episode of hemorrhoidal bleeding which has since resolved.  She also feels very tired and has some ongoing skin changes from radiation.  She denies any other complaints today. Rest of the pertinent 10 point ROS reviewed and negative  MEDICAL HISTORY:  Past Medical History:  Diagnosis Date   Breast cancer ([place]) 05/08/2021   Family history of breast cancer    Family history of colon cancer    Family history of pancreatic cancer     SURGICAL HISTORY: Past Surgical History:  Procedure Laterality Date   AXILLARY SENTINEL NODE BIOPSY Left 06/17/2021   Procedure: LEFT AXILLARY SENTINEL NODE BIOPSY;  Surgeon: Jovita Kussmaul, MD;  Location: [place];  Service: General;  Laterality: Left;   BREAST BIOPSY Left 05/08/2021   BREAST LUMPECTOMY Right 10/1998   BREAST LUMPECTOMY WITH RADIOACTIVE SEED AND SENTINEL LYMPH NODE BIOPSY Left 06/17/2021   Procedure: LEFT BREAST LUMPECTOMY WITH RADIOACTIVE SEEDS x 2;  Surgeon: Jovita Kussmaul, MD;  Location: [place];  Service: General;  Laterality: Left;   BREAST REDUCTION SURGERY Bilateral 07/24/2021   Procedure: BILATERAL ONCOPLASTIC BREAST REDUCTION;  Surgeon: Audelia Hives  S, DO;  Location: [place];  Service: Plastics;  Laterality: Bilateral;   MASTOPEXY Bilateral 07/24/2021   Procedure: MASTOPEXY;   Surgeon: Wallace Going, DO;  Location: [place];  Service: Plastics;  Laterality: Bilateral;    SOCIAL HISTORY: Social History   Socioeconomic History   Marital status: Married    Spouse name: Joan Dalton   Number of children: 5   Years of education: Not on file   Highest education level: Not on file  Occupational History   Not on file  Tobacco Use   Smoking status: Never   Smokeless tobacco: Never  Vaping Use   Vaping Use: Never used  Substance and Sexual Activity   Alcohol use: Never   Drug use: Never   Sexual activity: Yes  Other Topics Concern   Not on file  Social History Narrative   Not on file   Social Determinants of Health   Financial Resource Strain: Not on file  Food Insecurity: Not on file  Transportation Needs: Not on file  Physical Activity: Not on file  Stress: Not on file  Social Connections: Not on file  Intimate Partner Violence: Not At Risk (05/21/2021)   Humiliation, Afraid, Rape, and Kick questionnaire    Fear of Current or Ex-Partner: No    Emotionally Abused: No    Physically Abused: No    Sexually Abused: No    FAMILY HISTORY: Family History  Problem Relation Age of Onset   Breast cancer Mother        53s & 45s   Prostate cancer Father    Breast cancer Maternal Aunt 75   Pancreatic cancer Maternal Aunt 75   Breast cancer Maternal Aunt        later 30s-early 22s   Colon cancer Maternal Aunt 42   Colon cancer Maternal Uncle        60s   Colon cancer Maternal Grandfather    Pancreatic cancer Cousin        mother's maternal first cousin   Pancreatic cancer Cousin        mother's maternal first cousin    ALLERGIES:  is allergic to sulfamethoxazole-trimethoprim.  MEDICATIONS:  Current Outpatient Medications  Medication Sig Dispense Refill   acetaminophen (TYLENOL) 500 MG tablet Take 1,000 mg by mouth every 6 (six) hours as needed for moderate pain or headache.     COD LIVER OIL PO Take 5 mLs by mouth 3 (three) times a  week.     enoxaparin (LOVENOX) 40 MG/0.4ML injection Inject 0.4 mLs (40 mg total) into the skin daily for 7 days. 2.8 mL 0   ferrous sulfate 325 (65 FE) MG tablet Take 325 mg by mouth daily in the afternoon.     ibuprofen (ADVIL) 200 MG tablet Take 400 mg by mouth every 6 (six) hours as needed for moderate pain.     Multiple Vitamin (MULTI-VITAMIN) tablet Take 1 tablet by mouth daily.     ondansetron (ZOFRAN-ODT) 4 MG disintegrating tablet Take 1 tablet (4 mg total) by mouth every 8 (eight) hours as needed for nausea or vomiting. 20 tablet 0   oxyCODONE (ROXICODONE) 5 MG immediate release tablet Take 1 tablet (5 mg total) by mouth every 6 (six) hours as needed for severe pain. 15 tablet 0   VITAMIN D PO Take 1 capsule by mouth daily.     No current facility-administered medications for this visit.     PHYSICAL EXAMINATION: ECOG PERFORMANCE STATUS: 0 - Asymptomatic  Vitals:   10/30/21 1541  BP: (!) 146/95  Pulse: 94  Resp: 18  Temp: 97.7 F (36.5 C)  SpO2: 99%    Filed Weights   10/30/21 1541  Weight: 277 lb 1.6 oz (125.7 kg)    Physical exam deferred today, telehealth visit  LABORATORY DATA:  I have reviewed the data as listed Lab Results  Component Value Date   WBC 6.1 07/22/2021   HGB 11.9 (L) 07/22/2021   HCT 36.0 07/22/2021   MCV 95.0 07/22/2021   PLT 237 07/22/2021   Lab Results  Component Value Date   NA 140 06/12/2021   K 4.0 06/12/2021   CL 104 06/12/2021   CO2 26 06/12/2021    RADIOGRAPHIC STUDIES: I have personally reviewed the radiological reports and agreed with the findings in the report.  ASSESSMENT AND PLAN:   This is a very pleasant 46 year old female patient (menopausal status unknown, last period Several years ago but had endometrial ablation) referred to hematology/oncology for DCIS of the right breast and recommendations regarding the above.  We have discussed the following findings about DCIS.  We have once again today reviewed her  pathology which showed ductal carcinoma in situ, calculated her risk of recurrence based on [facility] nomogram.  She has 8 more treatments for adjuvant radiation She is not sure about menopause since she had endometrial ablation.  We have in the past as well as today discussed about considering aromatase inhibitors with ovarian suppression unless we can demonstrate menopause on multiple occasions.  When she comes for her next visit we will try to get [facility] and [place] drawn, serum estradiol to look at menopausal status however this may not be entirely reliable to confirm menopause.  She had a history of unprovoked PE hence I am reluctant to try tamoxifen. She is agreeable to all these recommendations She was satisfied with all the answers, return to clinic in 3 weeks  Thank you for consulting [place] in the care of this patient.  Please not hesitate to contact us with any additional questions or concerns.

## 2021-10-31 ENCOUNTER — Encounter: Payer: Self-pay | Admitting: Hematology and Oncology

## 2021-10-31 LAB — FOLLICLE STIMULATING HORMONE: FSH: 9.8 m[IU]/mL

## 2021-10-31 LAB — ESTRADIOL: Estradiol: 42.3 pg/mL

## 2021-10-31 MED ORDER — TAMOXIFEN CITRATE 20 MG PO TABS: 20.0000 mg | ORAL_TABLET | Freq: Every day | ORAL | 3 refills | Status: DC

## 2021-10-31 MED ORDER — APIXABAN 5 MG PO TABS: 5.0000 mg | ORAL_TABLET | Freq: Two times a day (BID) | ORAL | 4 refills | Status: DC

## 2021-11-05 ENCOUNTER — Encounter (INDEPENDENT_AMBULATORY_CARE_PROVIDER_SITE_OTHER): Payer: Self-pay | Admitting: Family Medicine

## 2021-11-05 ENCOUNTER — Ambulatory Visit (INDEPENDENT_AMBULATORY_CARE_PROVIDER_SITE_OTHER): Payer: BC Managed Care – PPO | Admitting: Family Medicine

## 2021-11-05 VITALS — BP 148/88 | HR 95 | Temp 97.7°F | Ht 66.0 in | Wt 275.0 lb

## 2021-11-05 DIAGNOSIS — E559 Vitamin D deficiency, unspecified: Secondary | ICD-10-CM | POA: Diagnosis not present

## 2021-11-05 DIAGNOSIS — F418 Other specified anxiety disorders: Secondary | ICD-10-CM

## 2021-11-05 DIAGNOSIS — Z1331 Encounter for screening for depression: Secondary | ICD-10-CM

## 2021-11-05 DIAGNOSIS — E7849 Other hyperlipidemia: Secondary | ICD-10-CM

## 2021-11-05 DIAGNOSIS — I10 Essential (primary) hypertension: Secondary | ICD-10-CM

## 2021-11-05 DIAGNOSIS — R0602 Shortness of breath: Secondary | ICD-10-CM | POA: Diagnosis not present

## 2021-11-05 DIAGNOSIS — R5383 Other fatigue: Secondary | ICD-10-CM | POA: Diagnosis not present

## 2021-11-05 DIAGNOSIS — Z6841 Body Mass Index (BMI) 40.0 and over, adult: Secondary | ICD-10-CM

## 2021-11-05 DIAGNOSIS — D0512 Intraductal carcinoma in situ of left breast: Secondary | ICD-10-CM

## 2021-11-05 DIAGNOSIS — K760 Fatty (change of) liver, not elsewhere classified: Secondary | ICD-10-CM | POA: Diagnosis not present

## 2021-11-06 LAB — COMPREHENSIVE METABOLIC PANEL
ALT: 17 IU/L (ref 0–32)
AST: 13 IU/L (ref 0–40)
Albumin/Globulin Ratio: 1.6 (ref 1.2–2.2)
Albumin: 4.5 g/dL (ref 3.9–4.9)
Alkaline Phosphatase: 68 IU/L (ref 44–121)
BUN/Creatinine Ratio: 12 (ref 9–23)
BUN: 8 mg/dL (ref 6–24)
Bilirubin Total: 0.3 mg/dL (ref 0.0–1.2)
CO2: 21 mmol/L (ref 20–29)
Calcium: 9.5 mg/dL (ref 8.7–10.2)
Chloride: 100 mmol/L (ref 96–106)
Creatinine, Ser: 0.68 mg/dL (ref 0.57–1.00)
Globulin, Total: 2.9 g/dL (ref 1.5–4.5)
Glucose: 87 mg/dL (ref 70–99)
Potassium: 4.6 mmol/L (ref 3.5–5.2)
Sodium: 138 mmol/L (ref 134–144)
Total Protein: 7.4 g/dL (ref 6.0–8.5)
eGFR: 108 mL/min/{1.73_m2} (ref >59)

## 2021-11-06 LAB — CBC WITH DIFFERENTIAL/PLATELET
Basophils Absolute: 0 10*3/uL (ref 0.0–0.2)
Basos: 1 %
EOS (ABSOLUTE): 0.1 10*3/uL (ref 0.0–0.4)
Eos: 2 %
Hematocrit: 40 % (ref 34.0–46.6)
Hemoglobin: 13.3 g/dL (ref 11.1–15.9)
Immature Grans (Abs): 0 10*3/uL (ref 0.0–0.1)
Immature Granulocytes: 0 %
Lymphocytes Absolute: 0.7 10*3/uL (ref 0.7–3.1)
Lymphs: 15 %
MCH: 31.4 pg (ref 26.6–33.0)
MCHC: 33.3 g/dL (ref 31.5–35.7)
MCV: 95 fL (ref 79–97)
Monocytes Absolute: 0.4 10*3/uL (ref 0.1–0.9)
Monocytes: 7 %
Neutrophils Absolute: 3.8 10*3/uL (ref 1.4–7.0)
Neutrophils: 75 %
Platelets: 266 10*3/uL (ref 150–450)
RBC: 4.23 x10E6/uL (ref 3.77–5.28)
RDW: 12.8 % (ref 11.7–15.4)
WBC: 5 10*3/uL (ref 3.4–10.8)

## 2021-11-06 LAB — INSULIN, RANDOM: INSULIN: 19.4 u[IU]/mL (ref 2.6–24.9)

## 2021-11-06 LAB — T3: T3, Total: 143 ng/dL (ref 71–180)

## 2021-11-06 LAB — VITAMIN B12: Vitamin B-12: 756 pg/mL (ref 232–1245)

## 2021-11-06 LAB — LIPID PANEL WITH LDL/HDL RATIO
Cholesterol, Total: 224 mg/dL — ABNORMAL HIGH (ref 100–199)
HDL: 74 mg/dL (ref >39)
LDL Chol Calc (NIH): 137 mg/dL — ABNORMAL HIGH (ref 0–99)
LDL/HDL Ratio: 1.9 ratio (ref 0.0–3.2)
Triglycerides: 76 mg/dL (ref 0–149)
VLDL Cholesterol Cal: 13 mg/dL (ref 5–40)

## 2021-11-06 LAB — HEMOGLOBIN A1C
Est. average glucose Bld gHb Est-mCnc: 114 mg/dL
Hgb A1c MFr Bld: 5.6 % (ref 4.8–5.6)

## 2021-11-06 LAB — TSH: TSH: 1.08 u[IU]/mL (ref 0.450–4.500)

## 2021-11-06 LAB — VITAMIN D 25 HYDROXY (VIT D DEFICIENCY, FRACTURES): Vit D, 25-Hydroxy: 17.9 ng/mL — ABNORMAL LOW (ref 30.0–100.0)

## 2021-11-06 LAB — FOLATE: Folate: 13 ng/mL (ref >3.0)

## 2021-11-06 LAB — T4, FREE: Free T4: 1.18 ng/dL (ref 0.82–1.77)

## 2021-11-12 NOTE — Progress Notes (Signed)
Chief Complaint:   OBESITY Joan Dalton (MR# [id number]) is a 46 y.o. female who presents for evaluation and treatment of obesity and related comorbidities. Current BMI is Body mass index is 44.39 kg/m. Joan Dalton has been struggling with her weight for many years and has been unsuccessful in either losing weight, maintaining weight loss, or reaching her healthy weight goal.  Joan Dalton referred by new PCP. She lives at home with husband and 5 kids [phone]). Skips breakfast or lunch mostly due to schedule. Breakfast, sandwich egg and cheese croissant and latte (eat all and feel satisfied). Not consistent with eating lunch. May go to dinner without eating, spaghetti (2 cups) with tomato sauce and chicken meatballs or ground [place] (satisfied).  Joan Dalton is currently in the action stage of change and ready to dedicate time achieving and maintaining a healthier weight. Joan Dalton is interested in becoming our patient and working on intensive lifestyle modifications including (but not limited to) diet and exercise for weight loss.  Joan Dalton's habits were reviewed today and are as follows: Her family eats meals together, she thinks her family will eat healthier with her, her desired weight loss is 133 lbs, she started gaining weight after having children, her heaviest weight ever was 278  pounds, she has significant food cravings issues, she snacks frequently in the evenings, she skips meals frequently, she is frequently drinking liquids with calories, she frequently makes poor food choices, and she frequently eats larger portions than normal.  Depression Screen Joan Dalton's Food and Mood (modified PHQ-9) score was 15.     11/05/2021    9:51 AM  Depression screen PHQ 2/9  Decreased Interest 3  Down, Depressed, Hopeless 2  PHQ - 2 Score 5  Altered sleeping 2  Tired, decreased energy 2  Change in appetite 1  Feeling bad or failure about yourself  1  Trouble concentrating  3  Moving slowly or fidgety/restless 1  Suicidal thoughts 0  PHQ-9 Score 15  Difficult doing work/chores Not difficult at all   Subjective:   1. Other fatigue Alisyn admits to daytime somnolence and admits to waking up still tired. Patient has a history of symptoms of morning fatigue. Joan Dalton generally gets 7 hours of sleep per night, and states that she has nightime awakenings. Snoring is present. Apneic episodes are present. Epworth Sleepiness Score is 10.    2. SOBOE (shortness of breath on exertion) Joan Dalton notes increasing shortness of breath with exercising and seems to be worsening over time with weight gain. She notes getting out of breath sooner with activity than she used to. This has not gotten worse recently. Joan Dalton denies shortness of breath at rest or orthopnea.   3. Other hyperlipidemia Joan Dalton is not taking any medication.  4. Essential hypertension Joan Dalton's blood pressure is elevated today. Denies chest pain, chest pressure and headache. She is not taking any medication.  5. Vitamin D deficiency Joan Dalton is not currently taking consistent Vit D. She notes fatigue.  6. NAFLD (nonalcoholic fatty liver disease) Joan Dalton reports historical diagnoses. Last LFT's within normal limits.  7. Ductal carcinoma in situ (DCIS) of left breast Joan Dalton just finished radiation. She is to start Aromatase inhibitor and Eliquis between now and next appointment.  8. Situational anxiety Joan Dalton noticing symptoms when speaking to a group or public speaking. Referred to counseling but wait is months long.   Assessment/Plan:   1. Other fatigue We will obtain labs today. Joan Dalton does feel that her weight is causing her energy to  be lower than it should be. Fatigue may be related to obesity, depression or many other causes. Labs will be ordered, and in the meanwhile, Joan Dalton will focus on self care including making healthy food choices, increasing  physical activity and focusing on stress reduction.  - EKG 12-Lead - Vitamin B12 - Folate - Hemoglobin A1c - Insulin, random - TSH - T4, free - T3  2. SOBOE (shortness of breath on exertion) We will obtain labs today. Joan Dalton does feel that she gets out of breath more easily that she used to when she exercises. Joan Dalton's shortness of breath appears to be obesity related and exercise induced. She has agreed to work on weight loss and gradually increase exercise to treat her exercise induced shortness of breath. Will continue to monitor closely.  - CBC with Differential/Platelet  3. Other hyperlipidemia We will obtain labs today.  - Lipid Panel With LDL/HDL Ratio  4. Essential hypertension Will follow blood pressure at next appointment.   5. Vitamin D deficiency We will obtain labs today.  - VITAMIN D 25 Hydroxy (Vit-D Deficiency, Fractures)  6. NAFLD (nonalcoholic fatty liver disease) We will obtain labs today.  - Comprehensive metabolic panel  7. Ductal carcinoma in situ (DCIS) of left breast Will follow up on plan and treatment at next appointment.  8. Situational anxiety Joan Dalton will follow up on symptoms at next appointment. Discussed treatment with Buspar or Propranolol.  9. Depression screening Joan Dalton had a positive depression screening. Depression is commonly associated with obesity and often results in emotional eating behaviors. We will monitor this closely and work on CBT to help improve the non-hunger eating patterns. Referral to Psychology may be required if no improvement is seen as she continues in our clinic.  10. Class 3 severe obesity with serious comorbidity and body mass index (BMI) of 40.0 to 44.9 in adult, unspecified obesity type (HCC) Joan Dalton is currently in the action stage of change and her goal is to continue with weight loss efforts. I recommend Joan Dalton begin the structured treatment plan as follows:  She has agreed to the  Category 2 Plan and the [facility].  Exercise goals: No exercise has been prescribed at this time.   Behavioral modification strategies: increasing lean protein intake, meal planning and cooking strategies, and keeping healthy foods in the home.  She was informed of the importance of frequent follow-up visits to maximize her success with intensive lifestyle modifications for her multiple health conditions. She was informed we would discuss her lab results at her next visit unless there is a critical issue that needs to be addressed sooner. Joan Dalton agreed to keep her next visit at the agreed upon time to discuss these results.  Objective:   Blood pressure (!) 148/88, pulse 95, temperature 97.7 F (36.5 C), height '5\' 6"'$  (1.676 m), weight 275 lb (124.7 kg), SpO2 100 %. Body mass index is 44.39 kg/m.  EKG: Normal sinus rhythm, rate 90 bpm.  Indirect Calorimeter completed today shows a VO2 of 162 and a REE of 1109.  Her calculated basal metabolic rate is 2876 thus her basal metabolic rate is worse than expected.  General: Cooperative, alert, well developed, in no acute distress. HEENT: Conjunctivae and lids unremarkable. Cardiovascular: Regular rhythm.  Lungs: Normal work of breathing. Neurologic: No focal deficits.   Lab Results  Component Value Date   CREATININE 0.68 11/05/2021   BUN 8 11/05/2021   NA 138 11/05/2021   K 4.6 11/05/2021   CL 100 11/05/2021  CO2 21 11/05/2021   Lab Results  Component Value Date   ALT 17 11/05/2021   AST 13 11/05/2021   ALKPHOS 68 11/05/2021   BILITOT 0.3 11/05/2021   Lab Results  Component Value Date   HGBA1C 5.6 11/05/2021   Lab Results  Component Value Date   INSULIN 19.4 11/05/2021   Lab Results  Component Value Date   TSH 1.080 11/05/2021   Lab Results  Component Value Date   CHOL 224 (H) 11/05/2021   HDL 74 11/05/2021   LDLCALC 137 (H) 11/05/2021   TRIG 76 11/05/2021   Lab Results  Component Value Date   WBC 5.0  11/05/2021   HGB 13.3 11/05/2021   HCT 40.0 11/05/2021   MCV 95 11/05/2021   PLT 266 11/05/2021   No results found for: "IRON", "TIBC", "FERRITIN"  Attestation Statements:   Reviewed by clinician on day of visit: allergies, medications, problem list, medical history, surgical history, family history, social history, and previous encounter notes.  I, Elnora Morrison, RMA am acting as transcriptionist for Coralie Common, MD.  This is the patient's first visit at [facility]. The patient's NEW PATIENT PACKET was reviewed at length. Included in the packet: current and past health history, medications, allergies, ROS, gynecologic history (women only), surgical history, family history, social history, weight history, weight loss surgery history (for those that have had weight loss surgery), nutritional evaluation, mood and food questionnaire, PHQ9, Epworth questionnaire, sleep habits questionnaire, patient life and health improvement goals questionnaire. These will all be scanned into the patient's chart under media.   During the visit, I independently reviewed the patient's EKG, bioimpedance scale results, and indirect calorimeter results. I used this information to tailor a meal plan for the patient that will help her to lose weight and will improve her obesity-related conditions going forward. I performed a medically necessary appropriate examination and/or evaluation. I discussed the assessment and treatment plan with the patient. The patient was provided an opportunity to ask questions and all were answered. The patient agreed with the plan and demonstrated an understanding of the instructions. Labs were ordered at this visit and will be reviewed at the next visit unless more critical results need to be addressed immediately. Clinical information was updated and documented in the EMR.   I have reviewed the above documentation for accuracy and completeness, and I agree with the  above. - Coralie Common, MD

## 2021-11-19 ENCOUNTER — Ambulatory Visit (INDEPENDENT_AMBULATORY_CARE_PROVIDER_SITE_OTHER): Payer: BC Managed Care – PPO | Admitting: Family Medicine

## 2021-11-20 ENCOUNTER — Ambulatory Visit (INDEPENDENT_AMBULATORY_CARE_PROVIDER_SITE_OTHER): Payer: BC Managed Care – PPO | Admitting: Family Medicine

## 2021-12-24 ENCOUNTER — Ambulatory Visit (INDEPENDENT_AMBULATORY_CARE_PROVIDER_SITE_OTHER): Payer: BC Managed Care – PPO | Admitting: Family Medicine

## 2021-12-24 ENCOUNTER — Encounter (INDEPENDENT_AMBULATORY_CARE_PROVIDER_SITE_OTHER): Payer: Self-pay | Admitting: Family Medicine

## 2021-12-24 VITALS — BP 128/89 | HR 110 | Temp 98.7°F | Ht 66.0 in | Wt 264.0 lb

## 2021-12-24 DIAGNOSIS — D0512 Intraductal carcinoma in situ of left breast: Secondary | ICD-10-CM

## 2021-12-24 DIAGNOSIS — E7849 Other hyperlipidemia: Secondary | ICD-10-CM | POA: Diagnosis not present

## 2021-12-24 DIAGNOSIS — E88819 Insulin resistance, unspecified: Secondary | ICD-10-CM

## 2021-12-24 DIAGNOSIS — E559 Vitamin D deficiency, unspecified: Secondary | ICD-10-CM | POA: Diagnosis not present

## 2021-12-24 DIAGNOSIS — I10 Essential (primary) hypertension: Secondary | ICD-10-CM

## 2021-12-24 DIAGNOSIS — E66813 Obesity, class 3: Secondary | ICD-10-CM

## 2021-12-24 DIAGNOSIS — Z6841 Body Mass Index (BMI) 40.0 and over, adult: Secondary | ICD-10-CM

## 2021-12-24 DIAGNOSIS — E669 Obesity, unspecified: Secondary | ICD-10-CM

## 2021-12-24 MED ORDER — VITAMIN D (ERGOCALCIFEROL) 1.25 MG (50000 UNIT) PO CAPS: 50000.0000 [IU] | ORAL_CAPSULE | ORAL | 0 refills | Status: DC

## 2021-12-25 NOTE — Progress Notes (Signed)
Chief Complaint:   OBESITY Joan Dalton is here to discuss her progress with her obesity treatment plan along with follow-up of her obesity related diagnoses. Joan Dalton is on the Category 2 Plan and the [place] and states she is following her eating plan approximately 10% of the time. Joan Dalton states she is exercising 0 minutes 0 times per week.  Today's visit was #: 2 Starting weight: 275 lbs Starting date: 11/05/2021 Today's weight: 264 lbs Today's date: 12/24/2021 Total lbs lost to date: 11 lbs Total lbs lost since last in-office visit: 11  Interim History: This is Joan Dalton's first follow up. She was down in [place] caring for her mother who was terminal and in Hospice. She tried to stick to the plan with some flexibility while away. She is trying to push forward but still grieving. Trying to make intentional food choices. No changes need to be made, per patient.  Subjective:   1. Other hyperlipidemia Labs discussed during visit today. Joan Dalton is not on medications. LDL of 137, HDL of 74, Trigly 76. 10 year ASCVD risk.  2. Vitamin D deficiency Labs discussed during visit today. Joan Dalton's Vit D level of 17.9. Denies any nausea, vomiting or muscle weakness. She notes fatigue. Taking over the counter Vit D.  3. Insulin resistance Labs discussed during visit today. A1c at 5.6, insulin at 19.4. Some increase of intake of carbs over the last few weeks.   4. Essential hypertension Joan Dalton's blood pressure is controlled today. Denies chest pain, chest pressure and headache. Blood pressure elevated at prior few appointments.  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 She voices concern about food interaction with Tamoxifen.  Assessment/Plan:   1. Other hyperlipidemia Will follow up with labs in 3 months.  2. Vitamin D deficiency We will fill Vit D 50k IU once a week for 1 month with 0  refills.  -Fill Vitamin D, Ergocalciferol, (DRISDOL) 1.25 MG (50000 UNIT) CAPS capsule; Take 1 capsule (50,000 Units total) by mouth every 7 (seven) days.  Dispense: 4 capsule; Refill: 0  3. Insulin resistance Will follow up with labs in 3 months.  4. Essential hypertension Will follow up with blood pressure at next appointment.  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  6. Obesity with current BMI of 42.7 Joan Dalton is currently in the action stage of change. As such, her goal is to continue with weight loss efforts. She has agreed to the Category 2 Plan and the [place].   Exercise goals: All adults should avoid inactivity. Some physical activity is better than none, and adults who participate in any amount of physical activity gain some health benefits.  Behavioral modification strategies: increasing lean protein intake, meal planning and cooking strategies, keeping healthy foods in the home, and planning for success.  Joan Dalton has agreed to follow-up with our clinic in 3 weeks. She was informed of the importance of frequent follow-up visits to maximize her success with intensive lifestyle modifications for her multiple health conditions.   Objective:   Blood pressure 128/89, pulse (!) 110, temperature 98.7 F (37.1 C), height '5\' 6"'$  (1.676 m), weight 264 lb (119.7 kg), SpO2 98 %. Body mass index is 42.61 kg/m.  General: Cooperative, alert, well developed, in no acute distress. HEENT: Conjunctivae and lids unremarkable. Cardiovascular: Regular rhythm.  Lungs: Normal work of breathing. Neurologic: No focal deficits.  Lab Results  Component Value Date   CREATININE 0.68 11/05/2021   BUN 8 11/05/2021   NA 138 11/05/2021   K 4.6 11/05/2021   CL 100 11/05/2021   CO2 21 11/05/2021   Lab Results  Component Value Date   ALT 17 11/05/2021   AST 13 11/05/2021   ALKPHOS 68 11/05/2021    BILITOT 0.3 11/05/2021   Lab Results  Component Value Date   HGBA1C 5.6 11/05/2021   Lab Results  Component Value Date   INSULIN 19.4 11/05/2021   Lab Results  Component Value Date   TSH 1.080 11/05/2021   Lab Results  Component Value Date   CHOL 224 (H) 11/05/2021   HDL 74 11/05/2021   LDLCALC 137 (H) 11/05/2021   TRIG 76 11/05/2021   Lab Results  Component Value Date   VD25OH 17.9 (L) 11/05/2021   Lab Results  Component Value Date   WBC 5.0 11/05/2021   HGB 13.3 11/05/2021   HCT 40.0 11/05/2021   MCV 95 11/05/2021   PLT 266 11/05/2021   No results found for: "IRON", "TIBC", "FERRITIN"  Attestation Statements:   Reviewed by clinician on day of visit: allergies, medications, problem list, medical history, surgical history, family history, social history, and previous encounter notes.  Time spent on visit including pre-visit chart review and post-visit care and charting was 45 minutes.   I, Elnora Morrison, RMA am acting as transcriptionist for Coralie Common, MD.  I have reviewed the above documentation for accuracy and completeness, and I agree with the above. - Coralie Common, MD

## 2022-01-13 ENCOUNTER — Telehealth: Payer: Self-pay | Admitting: Plastic Surgery

## 2022-01-13 NOTE — Telephone Encounter (Signed)
Pt called in to see if we thought she may need to see her earlier, she has an appt with Dr. Marla Roe on Friday but after thinking about it once she realized that a telephone call would be billed to her she decided just to keep her Friday appt.  Explained to pt that if she felt like it as an emergency to please seek treatment immediately at the ER and she could also contact our office back.

## 2022-01-17 ENCOUNTER — Ambulatory Visit (INDEPENDENT_AMBULATORY_CARE_PROVIDER_SITE_OTHER): Payer: BC Managed Care – PPO | Admitting: Physician Assistant

## 2022-01-17 ENCOUNTER — Ambulatory Visit: Payer: BC Managed Care – PPO | Admitting: Plastic Surgery

## 2022-01-17 DIAGNOSIS — D0512 Intraductal carcinoma in situ of left breast: Secondary | ICD-10-CM | POA: Diagnosis not present

## 2022-01-17 DIAGNOSIS — Z923 Personal history of irradiation: Secondary | ICD-10-CM

## 2022-01-17 NOTE — Progress Notes (Signed)
Referring Provider [name], [name], MD [address] [place],  [place] [address]   CC:  Chief Complaint  Patient presents with   Follow-up      Joan Dalton is an 46 y.o. female.  HPI: Patient is a pleasant 46 year old female with PMH of left-sided DCIS s/p partial mastectomy performed 06/17/2021 now s/p bilateral oncoplastic reduction and mastopexy performed 07/24/2021 by Dr. Marla Roe presents to clinic for 94-monthfollow-up.  She was last seen here in clinic on 09/17/2021.  At that time, she was in the process of receiving radiation treatments.  She had noticed considerable redness and bruising over the left breast from her radiation treatment and they were following her closely with plans to adjust as needed.  While her left NAC was darker at her initial postop visit, she reported that it improved with daily Aquaphor and trending back towards her normal color.  There was no evidence of infection on exam and she appeared to be well-healed from an oncoplastic reduction and mastopexy standpoint.  Discussed plans to initiate scar mitigation creams once RT is complete and then follow-up in clinic for her 643-monthollow-up appointment.  Today, she tells me that she had been doing well after her last encounter, but last week started having discomfort and feeling as though her left breast with "burning" and tight.  She also states that it feels a lot more firm than the contralateral side.  She also states that her skin was skin orange peel and wanted to come in to make sure that it was not an issue.  She states that since taking Tylenol a couple days ago, her symptoms have improved.    Allergies  Allergen Reactions   Sulfamethoxazole-Trimethoprim Dermatitis and Itching    Outpatient Encounter Medications as of 01/17/2022  Medication Sig   apixaban (ELIQUIS) 5 MG TABS tablet Take 1 tablet (5 mg total) by mouth 2 (two) times daily.   ferrous sulfate 325 (65 FE) MG tablet Take  325 mg by mouth daily in the afternoon.   Multiple Vitamin (MULTI-VITAMIN) tablet Take 1 tablet by mouth daily.   tamoxifen (NOLVADEX) 20 MG tablet Take 1 tablet (20 mg total) by mouth daily.   VITAMIN D PO Take 1 capsule by mouth daily.   Vitamin D, Ergocalciferol, (DRISDOL) 1.25 MG (50000 UNIT) CAPS capsule Take 1 capsule (50,000 Units total) by mouth every 7 (seven) days.   No facility-administered encounter medications on file as of 01/17/2022.     Past Medical History:  Diagnosis Date   Anemia    Anxiety    Bilateral swelling of feet    [facility] [place]   Constipation    Family history of breast cancer    Family history of colon cancer    Family history of pancreatic cancer    Fatty liver    Hx of blood clots    Hypertension    Joint pain    Joint pain    Lactose intolerance    Palpitations    Vitamin D deficiency     Past Surgical History:  Procedure Laterality Date   ABLATION     D&C Ablation of Uterus   AXILLARY SENTINEL NODE BIOPSY Left 06/17/2021   Procedure: LEFT AXILLARY SENTINEL NODE BIOPSY;  Surgeon: ToJovita KussmaulMD;  [facility];  Laterality: Left;   BREAST BIOPSY Left 05/08/2021   BREAST LUMPECTOMY Right 10/1998   BREAST LUMPECTOMY WITH RADIOACTIVE SEED AND SENTINEL  LYMPH NODE BIOPSY Left 06/17/2021   Procedure: LEFT BREAST LUMPECTOMY WITH RADIOACTIVE SEEDS x 2;  Surgeon: Jovita Kussmaul, MD;  Location: [facility];  Service: General;  Laterality: Left;   BREAST REDUCTION SURGERY Bilateral 07/24/2021   Procedure: BILATERAL ONCOPLASTIC BREAST REDUCTION;  Surgeon: Wallace Going, DO;  Location: [facility];  Service: Plastics;  Laterality: Bilateral;   MASTOPEXY Bilateral 07/24/2021   Procedure: MASTOPEXY;  Surgeon: Wallace Going, DO;  Location: [facility];  Service: Plastics;  Laterality: Bilateral;    Family History  Problem Relation Age of Onset   Breast cancer Mother        77s & 36s   Hypertension Father     Hyperlipidemia Father    Diabetes Father    Prostate cancer Father    Heart disease Father    Sleep apnea Father    Obesity Father    Colon cancer Maternal Grandfather    Breast cancer Maternal Aunt 75   Pancreatic cancer Maternal Aunt 75   Breast cancer Maternal Aunt        later 30s-early 48s   Colon cancer Maternal Aunt 42   Colon cancer Maternal Uncle        60s   Pancreatic cancer Cousin        mother's maternal first cousin   Pancreatic cancer Cousin        mother's maternal first cousin    Social History   Social History Narrative   Not on file     Review of Systems General: Denies fevers or chills Skin: Endorses skin changes left breast  Physical Exam    12/24/2021   11:00 AM 11/05/2021   10:00 AM 11/05/2021    7:00 AM  Vitals with BMI  Height '5\' 6"'$   '5\' 6"'$   Weight 264 lbs  275 lbs  BMI 62.83  15.17  Systolic 616 073 710  Diastolic 89 88 626  Pulse 110  95    General:  No acute distress, nontoxic appearing  Respiratory: No increased work of breathing Neuro: Alert and oriented Psychiatric: Normal mood and affect  Skin: Breasts with good shape and symmetry.  The right breast incisional scars have keloid.  Left breast with clear evidence of radiation damage.  Suspect that her peau d'orange is reflective of radiation rather than cellulitis or new malignancy.  Nontender.  Scars without keloiding.  Skin firmness is consistent with radiation treatment, no crepitus or fluctuance.  Assessment/Plan  Patient's discomfort in right breast has improved spontaneously with Tylenol.  Low suspicion for cellulitis for that reason.  Her description of symptoms is likely reflective of her radiation changes.  As for her left breast, evidence of new keloiding which she states must of developed in the past month.  Advised her to massage the scars regularly and to apply a silicone scar gel.  Encouraged her to return for keloid injections.  We also appreciate her returning in 2 weeks to  ensure that she has improved from her right-sided discomfort.  Patient voices understanding and is agreeable to the plan. Picture(s) obtained of the patient and placed in the chart were with the patient's or guardian's permission.   Krista Blue 01/17/2022, 2:05 PM

## 2022-01-24 ENCOUNTER — Ambulatory Visit: Payer: BC Managed Care – PPO | Admitting: Plastic Surgery

## 2022-01-27 ENCOUNTER — Ambulatory Visit (INDEPENDENT_AMBULATORY_CARE_PROVIDER_SITE_OTHER): Payer: BC Managed Care – PPO | Admitting: Family Medicine

## 2022-01-27 ENCOUNTER — Encounter (INDEPENDENT_AMBULATORY_CARE_PROVIDER_SITE_OTHER): Payer: Self-pay | Admitting: Family Medicine

## 2022-01-27 VITALS — BP 124/85 | HR 70 | Temp 97.9°F | Ht 66.0 in | Wt 265.0 lb

## 2022-01-27 DIAGNOSIS — E669 Obesity, unspecified: Secondary | ICD-10-CM

## 2022-01-27 DIAGNOSIS — E7849 Other hyperlipidemia: Secondary | ICD-10-CM | POA: Diagnosis not present

## 2022-01-27 DIAGNOSIS — E559 Vitamin D deficiency, unspecified: Secondary | ICD-10-CM

## 2022-01-27 DIAGNOSIS — Z6841 Body Mass Index (BMI) 40.0 and over, adult: Secondary | ICD-10-CM | POA: Diagnosis not present

## 2022-01-27 MED ORDER — VITAMIN D (ERGOCALCIFEROL) 1.25 MG (50000 UNIT) PO CAPS: 50000.0000 [IU] | ORAL_CAPSULE | ORAL | 0 refills | Status: DC

## 2022-01-30 ENCOUNTER — Encounter: Payer: Self-pay | Admitting: Adult Health

## 2022-01-30 ENCOUNTER — Inpatient Hospital Stay: Payer: BC Managed Care – PPO | Attending: Genetic Counselor | Admitting: Adult Health

## 2022-01-30 VITALS — BP 132/79 | HR 97 | Temp 98.1°F | Resp 16 | Ht 66.0 in | Wt 268.8 lb

## 2022-01-30 DIAGNOSIS — D0512 Intraductal carcinoma in situ of left breast: Secondary | ICD-10-CM

## 2022-01-30 DIAGNOSIS — Z79810 Long term (current) use of selective estrogen receptor modulators (SERMs): Secondary | ICD-10-CM | POA: Diagnosis not present

## 2022-01-30 DIAGNOSIS — D0511 Intraductal carcinoma in situ of right breast: Secondary | ICD-10-CM | POA: Diagnosis present

## 2022-01-30 DIAGNOSIS — Z923 Personal history of irradiation: Secondary | ICD-10-CM | POA: Diagnosis not present

## 2022-01-30 DIAGNOSIS — Z170 Estrogen receptor positive status [ER+]: Secondary | ICD-10-CM | POA: Insufficient documentation

## 2022-01-30 NOTE — Progress Notes (Signed)
SURVIVORSHIP VISIT:  BRIEF ONCOLOGIC HISTORY:  Oncology History  Ductal carcinoma in situ (DCIS) of left breast  05/20/2021 Initial Diagnosis   Ductal carcinoma in situ (DCIS) of left breast   06/05/2021 Genetic Testing   Negative genetic testing on the CancerNext-Expanded+RNAinsight panel.  The report date is June 05, 2021.  The CancerNext-Expanded gene panel offered by [facility] and includes sequencing and rearrangement analysis for the following 77 genes: AIP, ALK, APC*, ATM*, AXIN2, BAP1, BARD1, BLM, BMPR1A, BRCA1*, BRCA2*, BRIP1*, CDC73, CDH1*, CDK4, CDKN1B, CDKN2A, CHEK2*, CTNNA1, DICER1, FANCC, FH, FLCN, GALNT12, KIF1B, LZTR1, MAX, MEN1, MET, MLH1*, MSH2*, MSH3, MSH6*, MUTYH*, NBN, NF1*, NF2, NTHL1, PALB2*, PHOX2B, PMS2*, POT1, PRKAR1A, PTCH1, PTEN*, RAD51C*, RAD51D*, RB1, RECQL, RET, SDHA, SDHAF2, SDHB, SDHC, SDHD, SMAD4, SMARCA4, SMARCB1, SMARCE1, STK11, SUFU, TMEM127, TP53*, TSC1, TSC2, VHL and XRCC2 (sequencing and deletion/duplication); EGFR, EGLN1, HOXB13, KIT, MITF, PDGFRA, POLD1, and POLE (sequencing only); EPCAM and GREM1 (deletion/duplication only). DNA and RNA analyses performed for * genes.    06/17/2021 Surgery   Patient had left breast lumpectomy which showed high-grade ductal carcinoma in situ with necrosis and calcifications, resection margins are negative for carcinoma left additional superior margin excision again negative for carcinoma.  She had 3 out of 3 lymph nodes without involvement.  Prior prognostic showed ER 85% positive strong staining PR 95% positive strong staining. She is now undergoing plastic surgery, had right and left breast mammoplasty with no evidence of malignancy.   08/28/2021 - 10/16/2021 Radiation Therapy   Site Technique Total Dose (Gy) Dose per Fx (Gy) Completed Fx Beam Energies  Breast, Left: Breast_L 3D 50.4/50.4 1.8 28/28 10XFFF  Breast, Left: Breast_L_Bst 3D 10/10 2 5/5 6X, 10X     10/2021 -  Anti-estrogen oral therapy   Tamoxifen      INTERVAL HISTORY:  Joan Dalton to review her survivorship care plan detailing her treatment course for breast cancer, as well as monitoring long-term side effects of that treatment, education regarding health maintenance, screening, and overall wellness and health promotion.     Overall, Joan Dalton reports feeling quite well.  She is taking Tamoxifen daily.  She denies any significant effects, however is experiencing left breast swelling.    REVIEW OF SYSTEMS:  Review of Systems  Constitutional:  Negative for appetite change, chills, fatigue, fever and unexpected weight change.  HENT:   Negative for hearing loss, lump/mass and trouble swallowing.   Eyes:  Negative for eye problems and icterus.  Respiratory:  Negative for chest tightness, cough and shortness of breath.   Cardiovascular:  Negative for chest pain, leg swelling and palpitations.  Gastrointestinal:  Negative for abdominal distention, abdominal pain, constipation, diarrhea, nausea and vomiting.  Endocrine: Negative for hot flashes.  Genitourinary:  Negative for difficulty urinating.   Musculoskeletal:  Negative for arthralgias.  Skin:  Negative for itching and rash.  Neurological:  Negative for dizziness, extremity weakness, headaches and numbness.  Hematological:  Negative for adenopathy. Does not bruise/bleed easily.  Psychiatric/Behavioral:  Negative for depression. The patient is not nervous/anxious.   Breast: Denies any new nodularity, masses, tenderness, nipple changes, or nipple discharge.      ONCOLOGY TREATMENT TEAM:  1. Surgeon:  Dr. Marlou Starks at [facility] 2. Medical Oncologist: Dr. Chryl Heck  3. Radiation Oncologist: Dr. Lisbeth Renshaw    PAST MEDICAL/SURGICAL HISTORY:  Past Medical History:  Diagnosis Date   Anemia    Anxiety    Bilateral swelling of feet    Breast cancer ([place]) 05/08/2021   Constipation  Family history of breast cancer    Family history of colon cancer    Family history of pancreatic  cancer    Fatty liver    Hx of blood clots    Hypertension    Joint pain    Joint pain    Lactose intolerance    Palpitations    Vitamin D deficiency    Past Surgical History:  Procedure Laterality Date   ABLATION     D&C Ablation of Uterus   AXILLARY SENTINEL NODE BIOPSY Left 06/17/2021   Procedure: LEFT AXILLARY SENTINEL NODE BIOPSY;  Surgeon: Jovita Kussmaul, MD;  Location: [place];  Service: General;  Laterality: Left;   BREAST BIOPSY Left 05/08/2021   BREAST LUMPECTOMY Right 10/1998   BREAST LUMPECTOMY WITH RADIOACTIVE SEED AND SENTINEL LYMPH NODE BIOPSY Left 06/17/2021   Procedure: LEFT BREAST LUMPECTOMY WITH RADIOACTIVE SEEDS x 2;  Surgeon: Jovita Kussmaul, MD;  Location: [place];  Service: General;  Laterality: Left;   BREAST REDUCTION SURGERY Bilateral 07/24/2021   Procedure: BILATERAL ONCOPLASTIC BREAST REDUCTION;  Surgeon: Wallace Going, DO;  Location: [place];  Service: Plastics;  Laterality: Bilateral;   MASTOPEXY Bilateral 07/24/2021   Procedure: MASTOPEXY;  Surgeon: Wallace Going, DO;  Location: [place];  Service: Plastics;  Laterality: Bilateral;     ALLERGIES:  Allergies  Allergen Reactions   Sulfamethoxazole-Trimethoprim Dermatitis and Itching     CURRENT MEDICATIONS:  Outpatient Encounter Medications as of 01/30/2022  Medication Sig   apixaban (ELIQUIS) 5 MG TABS tablet Take 1 tablet (5 mg total) by mouth 2 (two) times daily.   ferrous sulfate 325 (65 FE) MG tablet Take 325 mg by mouth daily in the afternoon.   Multiple Vitamin (MULTI-VITAMIN) tablet Take 1 tablet by mouth daily.   tamoxifen (NOLVADEX) 20 MG tablet Take 1 tablet (20 mg total) by mouth daily.   VITAMIN D PO Take 1 capsule by mouth daily.   Vitamin D, Ergocalciferol, (DRISDOL) 1.25 MG (50000 UNIT) CAPS capsule Take 1 capsule (50,000 Units total) by mouth every 7 (seven) days.   No facility-administered encounter medications on file as of 01/30/2022.     ONCOLOGIC FAMILY HISTORY:   Family History  Problem Relation Age of Onset   Breast cancer Mother        48s & 42s   Hypertension Father    Hyperlipidemia Father    Diabetes Father    Prostate cancer Father    Heart disease Father    Sleep apnea Father    Obesity Father    Colon cancer Maternal Grandfather    Breast cancer Maternal Aunt 75   Pancreatic cancer Maternal Aunt 75   Breast cancer Maternal Aunt        later 30s-early 20s   Colon cancer Maternal Aunt 42   Colon cancer Maternal Uncle        60s   Pancreatic cancer Cousin        mother's maternal first cousin   Pancreatic cancer Cousin        mother's maternal first cousin    SOCIAL HISTORY:  Social History   Socioeconomic History   Marital status: Married    Spouse name: DAMANI RANDO II   Number of children: 5   Years of education: Not on file   Highest education level: Not on file  Occupational History   Not on file  Tobacco Use   Smoking status: Never   Smokeless tobacco: Never  Vaping Use  Vaping Use: Never used  Substance and Sexual Activity   Alcohol use: Never   Drug use: Never   Sexual activity: Yes  Other Topics Concern   Not on file  Social History Narrative   Not on file   Social Determinants of Health   Financial Resource Strain: Not on file  Food Insecurity: Not on file  Transportation Needs: Not on file  Physical Activity: Not on file  Stress: Not on file  Social Connections: Not on file  Intimate Partner Violence: Not At Risk (05/21/2021)   Humiliation, Afraid, Rape, and Kick questionnaire    Fear of Current or Ex-Partner: No    Emotionally Abused: No    Physically Abused: No    Sexually Abused: No     OBSERVATIONS/OBJECTIVE:  There were no vitals taken for this visit. GENERAL: Patient is a well appearing female in no acute distress HEENT:  Sclerae anicteric.  Oropharynx clear and moist. No ulcerations or evidence of oropharyngeal candidiasis. Neck is supple.  NODES:  No cervical, supraclavicular,  or axillary lymphadenopathy palpated.  BREAST EXAM: Left breast status postlumpectomy and radiation no sign of local recurrence right breast is benign.  Left breast with + breastl lymphedema, no erythema or sign of infection LUNGS:  Clear to auscultation bilaterally.  No wheezes or rhonchi. HEART:  Regular rate and rhythm. No murmur appreciated. ABDOMEN:  Soft, nontender.  Positive, normoactive bowel sounds. No organomegaly palpated. MSK:  No focal spinal tenderness to palpation. Full range of motion bilaterally in the upper extremities. EXTREMITIES:  No peripheral edema.   SKIN:  Clear with no obvious rashes or skin changes. No nail dyscrasia. NEURO:  Nonfocal. Well oriented.  Appropriate affect.  LABORATORY DATA:  None for this visit.  DIAGNOSTIC IMAGING:  None for this visit.      ASSESSMENT AND PLAN:  Ms.. Dalton is a pleasant 46 y.o. female with Stage 0 left breast DCIS, ER+/PR+, diagnosed in February 2023, treated with lumpectomy, adjuvant radiation therapy, and anti-estrogen therapy with tamoxifen beginning in August 2023.  She presents to the Survivorship Clinic for our initial meeting and routine follow-up post-completion of treatment for breast cancer.    1. Stage 0 left breast cancer:  Joan Dalton is continuing to recover from definitive treatment for breast cancer. She will follow-up with her medical oncologist, Dr. Chryl Heck in 6 months with history and physical exam per surveillance protocol.  She will continue her anti-estrogen therapy with tamoxifen. Thus far, she is tolerating the Tamoxifen  well, with minimal side effects. Her mammogram is due 03/2022; orders placed today.   Today, a comprehensive survivorship care plan and treatment summary was reviewed with the patient today detailing her breast cancer diagnosis, treatment course, potential late/long-term effects of treatment, appropriate follow-up care with recommendations for the future, and patient education resources.  A copy  of this summary, along with a letter will be sent to the patient's primary care provider via mail/fax/In Basket message after today's visit.    2. Breast lymphedema: I placed a referral to PT for her today  3. Bone health:  She was given education on specific activities to promote bone health.  4. Cancer screening:  Due to Joan Dalton's history and her age, she should receive screening for skin cancers, colon cancer, and gynecologic cancers.  The information and recommendations are listed on the patient's comprehensive care plan/treatment summary and were reviewed in detail with the patient.    5. Health maintenance and wellness promotion: Joan Dalton was  encouraged to consume 5-7 servings of fruits and vegetables per day. We reviewed the "Nutrition Rainbow" handout.  She was also encouraged to engage in moderate to vigorous exercise for 30 minutes per day most days of the week. We discussed the [employer] fitness program, which is designed for cancer survivors to help them become more physically fit after cancer treatments.  She was instructed to limit her alcohol consumption and continue to abstain from tobacco use.     6. Support services/counseling: It is not uncommon for this period of the patient's cancer care trajectory to be one of many emotions and stressors.   She was given information regarding our available services and encouraged to contact me with any questions or for help enrolling in any of our support group/programs.    Follow up instructions:    -Return to cancer center 6 months for f/u with Dr. Chryl Heck  -Mammogram due in 03/2022 -She is welcome to return back to the Survivorship Clinic at any time; no additional follow-up needed at this time.  -Consider referral back to survivorship as a long-term survivor for continued surveillance  The patient was provided an opportunity to ask questions and all were answered. The patient agreed with the plan and demonstrated an understanding  of the instructions.   Total encounter time:40 minutes*in face-to-face visit time, chart review, lab review, care coordination, order entry, and documentation of the encounter time.  [name], NP 01/30/22 10:51 AM Medical Oncology and Hematology [facility] [place], [place] [address] Tel. [phone]    Fax. [phone]  *Total Encounter Time as defined by the Centers for Medicare and Medicaid Services includes, in addition to the face-to-face time of a patient visit (documented in the note above) non-face-to-face time: obtaining and reviewing outside history, ordering and reviewing medications, tests or procedures, care coordination (communications with other health care professionals or caregivers) and documentation in the medical record.

## 2022-01-31 ENCOUNTER — Telehealth: Payer: Self-pay | Admitting: Adult Health

## 2022-01-31 NOTE — Telephone Encounter (Signed)
Scheduled appointment per 11/9 los. Left voicemail.

## 2022-02-01 ENCOUNTER — Other Ambulatory Visit: Payer: Self-pay | Admitting: Hematology and Oncology

## 2022-02-03 NOTE — Progress Notes (Signed)
Chief Complaint:   OBESITY Joan Dalton is here to discuss her progress with her obesity treatment plan along with follow-up of her obesity related diagnoses. Joan Dalton is on the [employer] and states she is following her eating plan approximately 25% of the time. Joan Dalton states she is cardio 60 minutes 3 times per week.  Today's visit was #: 3 Starting weight: 275 lbs Starting date: 11/05/2021 Today's weight: 265 lbs Today's date: 01/27/2022 Total lbs lost to date: 10 lbs Total lbs lost since last in-office visit: 0  Interim History: Joan Dalton has had a significant amount of familial stress recently--her husband had gotten into a fatal car wreck and now noticing significant difficulty adhering to plan. She mentions she is traveling for Thanksgiving to a siblings house.  Subjective:   1. Vitamin D deficiency Joan Dalton is currently taking prescription Vit D 50,000 IU once a week. Some improvement in fatigue when on Vit D. Her Vit D level of 17.9 on last lab draw.  2. Other hyperlipidemia Joan Dalton's last LDL 137, HDL 74, Trigly 76. She is not on medication.  Assessment/Plan:   1. Vitamin D deficiency Refill Vit D 50k IU once a week for 1 month with 0 refills.  2. Other hyperlipidemia Repeat labs in 2-3 months,  3. Obesity with current BMI of 42.8 Joan Dalton is currently in the action stage of change. As such, her goal is to continue with weight loss efforts. She has agreed to the [employer].   Exercise goals: All adults should avoid inactivity. Some physical activity is better than none, and adults who participate in any amount of physical activity gain some health benefits.  Behavioral modification strategies: increasing lean protein intake, meal planning and cooking strategies, keeping healthy foods in the home, and planning for success.  Joan Dalton has agreed to follow-up with our clinic in 4 weeks. She was informed of the importance of frequent  follow-up visits to maximize her success with intensive lifestyle modifications for her multiple health conditions.   Objective:   Blood pressure 124/85, pulse 70, temperature 97.9 F (36.6 C), height '5\' 6"'$  (1.676 m), weight 265 lb (120.2 kg), SpO2 98 %. Body mass index is 42.77 kg/m.  General: Cooperative, alert, well developed, in no acute distress. HEENT: Conjunctivae and lids unremarkable. Cardiovascular: Regular rhythm.  Lungs: Normal work of breathing. Neurologic: No focal deficits.   Lab Results  Component Value Date   CREATININE 0.68 11/05/2021   BUN 8 11/05/2021   NA 138 11/05/2021   K 4.6 11/05/2021   CL 100 11/05/2021   CO2 21 11/05/2021   Lab Results  Component Value Date   ALT 17 11/05/2021   AST 13 11/05/2021   ALKPHOS 68 11/05/2021   BILITOT 0.3 11/05/2021   Lab Results  Component Value Date   HGBA1C 5.6 11/05/2021   Lab Results  Component Value Date   INSULIN 19.4 11/05/2021   Lab Results  Component Value Date   TSH 1.080 11/05/2021   Lab Results  Component Value Date   CHOL 224 (H) 11/05/2021   HDL 74 11/05/2021   LDLCALC 137 (H) 11/05/2021   TRIG 76 11/05/2021   Lab Results  Component Value Date   VD25OH 17.9 (L) 11/05/2021   Lab Results  Component Value Date   WBC 5.0 11/05/2021   HGB 13.3 11/05/2021   HCT 40.0 11/05/2021   MCV 95 11/05/2021   PLT 266 11/05/2021   No results found for: "IRON", "TIBC", "FERRITIN"  Attestation Statements:  Reviewed by clinician on day of visit: allergies, medications, problem list, medical history, surgical history, family history, social history, and previous encounter notes.  I, Elnora Morrison, RMA am acting as transcriptionist for Coralie Common, MD.  I have reviewed the above documentation for accuracy and completeness, and I agree with the above. - Coralie Common, MD

## 2022-02-04 ENCOUNTER — Ambulatory Visit: Payer: BC Managed Care – PPO | Admitting: Physician Assistant

## 2022-02-04 ENCOUNTER — Encounter: Payer: Self-pay | Admitting: Student

## 2022-02-04 ENCOUNTER — Ambulatory Visit: Payer: BC Managed Care – PPO | Admitting: Surgical

## 2022-02-04 VITALS — BP 144/88 | HR 85

## 2022-02-04 DIAGNOSIS — Z9889 Other specified postprocedural states: Secondary | ICD-10-CM

## 2022-02-04 NOTE — Progress Notes (Signed)
Referring Provider [name], [name], MD [address] [place],  [place] [address]   CC:  Chief Complaint  Patient presents with   Follow-up       Joan Dalton is an 46 y.o. female.  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  She is now status post bilateral oncoplastic reduction and mastopexy performed on 07/24/2021 by Dr. Marla Roe.  Patient presents to the clinic today for follow-up.  She was last seen in the clinic on 01/17/2022.  At this visit, she reported she started having discomfort and feeling as though her left breast was "burning" and tight.  She also reported that it felt a lot more firm than the contralateral side.  She reported that her skin was like the skin of an orange peel and wanted to have that evaluated.  On exam, breasts had good shape and symmetry.  The right breast incisional scars were noted to have keloids.  Left breast was noted to have evidence of radiation damage.  The scars to the left breast were without keloiding.  Plan was for patient to massage scars regularly and apply silicone scar gel.  Plan was for patient to return in 2 weeks for possible keloid injection and reevaluation.  Since her last office visit the patient notes that she has had no significant changes.  She denies any fevers, she notes continued firmness of the left breast and pain along the left axillary region which is worse with palpation and position.  She also notes ongoing keloiding to the right breast.  Review of Systems General:negative for fever  Physical Exam    02/04/2022    2:30 PM 01/30/2022   11:50 AM 01/27/2022   11:00 AM  Vitals with BMI  Height  '5\' 6"'$  '5\' 6"'$   Weight  268 lbs 13 oz 265 lbs  BMI  03.55 97.41  Systolic 638 453 646  Diastolic 88 79 85  Pulse 85 97 70    General:  No acute distress,  Alert and oriented, Non-Toxic, Normal speech and affect Chaperone present.   Right breast with no overlying redness, warmth to touch, no palpable fluid collections, keloid scars areola and vertical limb.  Left breast with overlying postradiation skin changes including Peau d' orange.  Left breast no palpable nodules, no palpable fluid collections.  Assessment/Plan  46 year old female seen in our office for follow-up evaluation.  Since her last office visit she has had no significant changes.  It does appear that she has chronic skin changes from radiation.  Although given the dark and skin color from radiation subtle changes would be difficult to know, but the patient has had no recent infectious signs or symptoms to indicate ongoing infection.  I did discuss for her to watch closely for any signs of infection return immediately if they present.  As for her keloiding on the right side she scar creams, and offered steroid injection.  The patient does not want steroids at this time but will reach out to [place] if that changes.  The patient also has some discomfort left axilla, she has no palpable mass or reproducible pain on my exam.  She is scheduled for physical therapy she will reach out to [place] if not improve her symptoms.  Patient was given strict return precautions.  She verbalized understanding and agreement to today's plan.  Joan Dalton 02/04/2022, 4:02 PM

## 2022-02-04 NOTE — Progress Notes (Deleted)
   Referring Provider [name], [name], MD [address] [place],  [place] [address]   CC: No chief complaint on file.     Joan Dalton is an 46 y.o. female.  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  She is now status post bilateral oncoplastic reduction and mastopexy performed on 07/24/2021 by Dr. Marla Roe.  Patient presents to the clinic today for follow-up.  She was last seen in the clinic on 01/17/2022.  At this visit, she reported she started having discomfort and feeling as though her left breast was "burning" and tight.  She also reported that it felt a lot more firm than the contralateral side.  She reported that her skin was like the skin of an orange peel and wanted to have that evaluated.  On exam, breasts had good shape and symmetry.  The right breast incisional scars were noted to have keloids.  Left breast was noted to have evidence of radiation damage.  The scars to the left breast were without keloiding.  Plan was for patient to massage scars regularly and apply silicone scar gel.  Plan was for patient to return in 2 weeks for possible keloid injection and reevaluation.  Review of Systems General: ***  Physical Exam    01/30/2022   11:50 AM 01/27/2022   11:00 AM 12/24/2021   11:00 AM  Vitals with BMI  Height '5\' 6"'$  '5\' 6"'$  '5\' 6"'$   Weight 268 lbs 13 oz 265 lbs 264 lbs  BMI 43.41 50.93 26.71  Systolic 245 809 983  Diastolic 79 85 89  Pulse 97 70 110    General:  No acute distress,  Alert and oriented, Non-Toxic, Normal speech and affect ***   Assessment/Plan ***  Joan Dalton 02/04/2022, 2:03 PM

## 2022-02-05 ENCOUNTER — Ambulatory Visit: Payer: BC Managed Care – PPO | Admitting: Student

## 2022-02-17 ENCOUNTER — Ambulatory Visit (INDEPENDENT_AMBULATORY_CARE_PROVIDER_SITE_OTHER): Payer: BC Managed Care – PPO | Admitting: Family Medicine

## 2022-02-18 NOTE — Therapy (Signed)
OUTPATIENT PHYSICAL THERAPY ONCOLOGY EVALUATION  Patient Name: CAMESHIA CRESSMAN MRN: [id number] DOB:07-Dec-1975, 46 y.o., female Today's Date: 02/19/2022  END OF SESSION:  PT End of Session - 02/19/22 1519     Visit Number 1    Number of Visits 9    Date for PT Re-Evaluation 03/19/22    PT Start Time 1245    PT Stop Time 1500    PT Time Calculation (min) 55 min    Activity Tolerance Patient tolerated treatment well    Behavior During Therapy [facility] for tasks assessed/performed             Past Medical History:  Diagnosis Date   Anemia    Anxiety    Bilateral swelling of feet    Breast cancer ([place]) 05/08/2021   Constipation    Family history of breast cancer    Family history of colon cancer    Family history of pancreatic cancer    Fatty liver    Hx of blood clots    Hypertension    Joint pain    Joint pain    Lactose intolerance    Palpitations    Vitamin D deficiency    Past Surgical History:  Procedure Laterality Date   ABLATION     D&C Ablation of Uterus   AXILLARY SENTINEL NODE BIOPSY Left 06/17/2021   Procedure: LEFT AXILLARY SENTINEL NODE BIOPSY;  Surgeon: Jovita Kussmaul, MD;  Location: [place];  Service: General;  Laterality: Left;   BREAST BIOPSY Left 05/08/2021   BREAST LUMPECTOMY Right 10/1998   BREAST LUMPECTOMY WITH RADIOACTIVE SEED AND SENTINEL LYMPH NODE BIOPSY Left 06/17/2021   Procedure: LEFT BREAST LUMPECTOMY WITH RADIOACTIVE SEEDS x 2;  Surgeon: Jovita Kussmaul, MD;  Location: [place];  Service: General;  Laterality: Left;   BREAST REDUCTION SURGERY Bilateral 07/24/2021   Procedure: BILATERAL ONCOPLASTIC BREAST REDUCTION;  Surgeon: Wallace Going, DO;  Location: [place];  Service: Plastics;  Laterality: Bilateral;   MASTOPEXY Bilateral 07/24/2021   Procedure: MASTOPEXY;  Surgeon: Wallace Going, DO;  Location: [place];  Service: Plastics;  Laterality: Bilateral;   Patient Active Problem List   Diagnosis Date Noted   Postoperative  breast asymmetry 07/08/2021   Genetic testing 06/06/2021   Family history of breast cancer 05/24/2021   Family history of colon cancer 05/24/2021   Family history of pancreatic cancer 05/24/2021   Ductal carcinoma in situ (DCIS) of left breast 05/20/2021    PCP: Early Osmond, MD  REFERRING PROVIDER: Gardenia Phlegm, NP  REFERRING DIAG: [phone] (ICD-10-CM) - Intraductal carcinoma in situ of left breast   THERAPY DIAG:  Lymphedema, not elsewhere classified  Stiffness of left shoulder, not elsewhere classified  Abnormal posture  Intraductal carcinoma in situ of left breast  ONSET DATE: 12/22/21  Rationale for Evaluation and Treatment: Rehabilitation  SUBJECTIVE:  SUBJECTIVE STATEMENT: About a month ago or a month and a half ago I started to have a sensation when sleeping on L side and it caused the side of the breast to get very tender. It was at the incision and then went around the side. I took Tylenol. I had a fever around the same time as well. I used an ice pack to get some relief. The pain was at the nipple and then it spread and radiated. The skin is turning harder.   PERTINENT HISTORY: Patient was diagnosed on 05/14/21 with L breast cancer. It measures 2.8 cm and is located in the upper outer quadrant. 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 06/17/21- L breast lumpectomy and SLNB 0/3, 07/24/21- bilateral oncoplastic breast reduction, Previous hx of  lumpectomy in 2000 on the R side for a nodule (not cancerous), hx of blood clot in 2021  PAIN:  Are you having pain? No  PRECAUTIONS: Other: L breast lymphedema  WEIGHT BEARING RESTRICTIONS: No  FALLS:  Has patient fallen in last 6 months? 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, full time Statistician in speech language pathology   LEISURE: pt recently got Peloton fixed and plans to begin biking again  HAND DOMINANCE: right   PRIOR LEVEL OF FUNCTION: Independent  PATIENT GOALS: to reduce the pain and tenderness in L breast and learn how to manage lymphedema   OBJECTIVE:  COGNITION: Overall cognitive status: Within functional limits for tasks assessed   PALPATION: Thickened/fibrotic edema in L breast  OBSERVATIONS / OTHER ASSESSMENTS: peau d orange skin of L breast, L breast larger than R  POSTURE:  Forward head and rounded shoulders posture   UPPER EXTREMITY AROM/PROM:   A/PROM RIGHT  05/16/2021    Shoulder extension 76  Shoulder flexion 153  Shoulder abduction 162  Shoulder internal rotation 63  Shoulder external rotation 75                          (Blank rows = not tested)   A/PROM LEFT  05/16/2021 LEFT 02/19/22  Shoulder extension 66 62  Shoulder flexion 153 140  Shoulder abduction 156 112  Shoulder internal rotation 63 55  Shoulder external rotation 74 85                          (Blank rows = not tested       UPPER EXTREMITY STRENGTH: 5/5     LYMPHEDEMA ASSESSMENTS:    LANDMARK RIGHT  05/16/2021 RIGHT 02/19/22  10 cm proximal to olecranon process 38.6 37.5  Olecranon process '31 30  10 '$ cm proximal to ulnar styloid process 27 26  Just proximal to ulnar styloid process 19.7 20  Across hand at thumb web space 22 22  At base of 2nd digit 8 7.8  (Blank rows = not tested)   LANDMARK LEFT  05/16/2021 LEFT 02/19/22  10 cm proximal to olecranon process 38 38.5  Olecranon process '31 31  10 '$ cm proximal to ulnar styloid process 26.9 25.5  Just proximal to ulnar styloid process 20.7 19.4  Across hand at thumb web space 21.5 21.7  At base of 2nd digit 7.5 7.5  (Blank rows = not tested)  LYMPHEDEMA ASSESSMENTS:  SURGERY TYPE/DATE: 06/17/21- L br lumpectomy and SLNB; 07/24/21 - bilateral oncoplastic breast reduction; hx of R lumpectomy in 2000 - non cancerous  NUMBER OF LYMPH NODES REMOVED: 0/3  CHEMOTHERAPY: none  RADIATION:complted on 10/16/21  HORMONE TREATMENT: began 10/2021- on Tamoxifen  INFECTIONS: none   FUNCTIONAL TESTS:  Breast Complaints Scale-  46   L-DEX FLOWSHEETS - 02/19/22 1400       L-DEX LYMPHEDEMA SCREENING   Measurement Type Unilateral    L-DEX MEASUREMENT EXTREMITY Upper Extremity    POSITION  Standing    DOMINANT SIDE Right    At Risk Side Left    BASELINE SCORE (UNILATERAL) 8.6             L-DEX LYMPHEDEMA SCREENING:  The patient was assessed using the L-Dex machine today to produce a lymphedema index baseline score. The patient will be reassessed on a regular basis (typically every 3 months) to obtain new L-Dex scores. If the score is > 6.5 points away from his/her baseline score indicating onset of subclinical lymphedema, it will be recommended to wear a compression garment for 4 weeks, 12 hours per day and then be reassessed. If the score continues to be > 6.5 points from baseline at reassessment, we will initiate lymphedema treatment. Assessing in this manner has a 95% rate of preventing clinically significant lymphedema.   L-DEX FLOWSHEETS - 02/19/22 1400       L-DEX LYMPHEDEMA SCREENING   Measurement Type Unilateral    L-DEX MEASUREMENT EXTREMITY Upper Extremity    POSITION  Standing    DOMINANT SIDE Right    At Risk Side Left    BASELINE SCORE (UNILATERAL) 8.6             TODAY'S TREATMENT:                                                                                                                                         DATE:  02/19/22: In supine: Short neck, 5 diaphragmatic breaths, R axillary nodes and establishment of interaxillary pathway, L inguinal nodes and establishment of axilloinguinal pathway, then L breast moving fluid  towards pathways spending extra time in any areas of fibrosis then retracing all steps while educating pt on anatomy and physiology of the lymphatic system and importance of skin stretch. Created foam chip pack for pt to wear in her bra for additional compression  PATIENT EDUCATION:  Education details: anatomy and physiology of the lymphatic system, need for compression bra, chip pack, basic principles of MLD Person educated: Patient Education method: Explanation Education comprehension: verbalized understanding  HOME EXERCISE PROGRAM: Wear compression bra as much as possible  ASSESSMENT:  CLINICAL IMPRESSION: Patient is a 46 y.o. female who was seen today for physical therapy evaluation and treatment for L breast lymphedema, decreased L shoulder ROM and L breast pain. Pt reports that her breast began swelling in October 2023 and overtime  became more hard with peau d orange texture. Pt also has decreased L shoulder ROM compared to baseline measurements. Educated pt to return to wearing her compression bra and to wear it as much as possible. Pt would benefit from skilled PT services to decrease L breast lymphedema, improve L shoulder ROM and progress pt towards independent management of her lymphedema.    OBJECTIVE IMPAIRMENTS: decreased knowledge of condition, decreased knowledge of use of DME, decreased ROM, decreased strength, increased edema, increased fascial restrictions, impaired UE functional use, postural dysfunction, and pain.   ACTIVITY LIMITATIONS: sleeping and reach over head  PARTICIPATION LIMITATIONS:  none  PERSONAL FACTORS: Time since onset of injury/illness/exacerbation are also affecting patient's functional outcome.   REHAB POTENTIAL: Good  CLINICAL DECISION MAKING: Stable/uncomplicated  EVALUATION COMPLEXITY: Low  GOALS: Goals reviewed with patient? Yes  SHORT TERM GOALS=LONG TERM GOALS Target date: 03/19/22  Pt will be independent in self MLD for long term  management of lymphedema.  Baseline: Goal status: INITIAL  2.  Pt will demonstrate decreased pore size and improved skin mobility in L breast to allow improved comfort.  Baseline:  Goal status: INITIAL  3.  Pt will demonstrate 150 degrees of L shoulder abudction to allow her to reach out to the side.  Baseline: 112 Goal status: INITIAL  4.  Pt will be independent in a home exericse program for continued stretching and strengthening.  Baseline:  Goal status: INITIAL   PLAN:  PT FREQUENCY: 2x/week  PT DURATION: 4 weeks  PLANNED INTERVENTIONS: Therapeutic exercises, Therapeutic activity, Patient/Family education, Self Care, Joint mobilization, Orthotic/Fit training, Manual lymph drainage, Compression bandaging, scar mobilization, Taping, Vasopneumatic device, Manual therapy, and Re-evaluation  PLAN FOR NEXT SESSION: instruct in MLD to L breast, how is compression bra? How was chip pack? ROM to L shoulder both PROM and AAROM, give HEP for this, compression pump?    [facility], PT 02/19/2022, 3:20 PM

## 2022-02-19 ENCOUNTER — Telehealth (INDEPENDENT_AMBULATORY_CARE_PROVIDER_SITE_OTHER): Payer: BC Managed Care – PPO | Admitting: Family Medicine

## 2022-02-19 ENCOUNTER — Other Ambulatory Visit: Payer: Self-pay

## 2022-02-19 ENCOUNTER — Encounter: Payer: Self-pay | Admitting: Physical Therapy

## 2022-02-19 ENCOUNTER — Encounter (INDEPENDENT_AMBULATORY_CARE_PROVIDER_SITE_OTHER): Payer: Self-pay | Admitting: Family Medicine

## 2022-02-19 ENCOUNTER — Ambulatory Visit: Payer: BC Managed Care – PPO | Attending: Adult Health | Admitting: Physical Therapy

## 2022-02-19 DIAGNOSIS — E559 Vitamin D deficiency, unspecified: Secondary | ICD-10-CM | POA: Diagnosis not present

## 2022-02-19 DIAGNOSIS — D0512 Intraductal carcinoma in situ of left breast: Secondary | ICD-10-CM | POA: Insufficient documentation

## 2022-02-19 DIAGNOSIS — I890 Lymphedema, not elsewhere classified: Secondary | ICD-10-CM | POA: Insufficient documentation

## 2022-02-19 DIAGNOSIS — E669 Obesity, unspecified: Secondary | ICD-10-CM

## 2022-02-19 DIAGNOSIS — Z6841 Body Mass Index (BMI) 40.0 and over, adult: Secondary | ICD-10-CM

## 2022-02-19 DIAGNOSIS — R293 Abnormal posture: Secondary | ICD-10-CM | POA: Diagnosis present

## 2022-02-19 DIAGNOSIS — E88819 Insulin resistance, unspecified: Secondary | ICD-10-CM | POA: Diagnosis not present

## 2022-02-19 DIAGNOSIS — M25612 Stiffness of left shoulder, not elsewhere classified: Secondary | ICD-10-CM | POA: Diagnosis present

## 2022-02-19 MED ORDER — VITAMIN D (ERGOCALCIFEROL) 1.25 MG (50000 UNIT) PO CAPS: 50000.0000 [IU] | ORAL_CAPSULE | ORAL | 0 refills | Status: DC

## 2022-02-20 MED ORDER — VITAMIN D (ERGOCALCIFEROL) 1.25 MG (50000 UNIT) PO CAPS: 50000.0000 [IU] | ORAL_CAPSULE | ORAL | 0 refills | Status: DC

## 2022-02-21 ENCOUNTER — Encounter: Payer: Self-pay | Admitting: Physical Therapy

## 2022-02-21 ENCOUNTER — Ambulatory Visit: Payer: BC Managed Care – PPO | Attending: Adult Health | Admitting: Physical Therapy

## 2022-02-21 DIAGNOSIS — D0512 Intraductal carcinoma in situ of left breast: Secondary | ICD-10-CM | POA: Insufficient documentation

## 2022-02-21 DIAGNOSIS — R293 Abnormal posture: Secondary | ICD-10-CM | POA: Insufficient documentation

## 2022-02-21 DIAGNOSIS — M25612 Stiffness of left shoulder, not elsewhere classified: Secondary | ICD-10-CM | POA: Diagnosis present

## 2022-02-21 DIAGNOSIS — I890 Lymphedema, not elsewhere classified: Secondary | ICD-10-CM | POA: Diagnosis present

## 2022-02-21 NOTE — Therapy (Signed)
OUTPATIENT PHYSICAL THERAPY ONCOLOGY TREATMENT  Patient Name: Joan Dalton MRN: [id number] DOB:07-21-1975, 46 y.o., female Today's Date: 02/21/2022  END OF SESSION:  PT End of Session - 02/21/22 0948     Visit Number 2    Number of Visits 9    Date for PT Re-Evaluation 03/19/22    PT Start Time 0902    PT Stop Time 5625    PT Time Calculation (min) 53 min    Activity Tolerance Patient tolerated treatment well    Behavior During Therapy [facility] for tasks assessed/performed             Past Medical History:  Diagnosis Date   Anemia    Anxiety    Bilateral swelling of feet    Breast cancer ([place]) 05/08/2021   Constipation    Family history of breast cancer    Family history of colon cancer    Family history of pancreatic cancer    Fatty liver    Hx of blood clots    Hypertension    Joint pain    Joint pain    Lactose intolerance    Palpitations    Vitamin D deficiency    Past Surgical History:  Procedure Laterality Date   ABLATION     D&C Ablation of Uterus   AXILLARY SENTINEL NODE BIOPSY Left 06/17/2021   Procedure: LEFT AXILLARY SENTINEL NODE BIOPSY;  Surgeon: Jovita Kussmaul, MD;  Location: [place];  Service: General;  Laterality: Left;   BREAST BIOPSY Left 05/08/2021   BREAST LUMPECTOMY Right 10/1998   BREAST LUMPECTOMY WITH RADIOACTIVE SEED AND SENTINEL LYMPH NODE BIOPSY Left 06/17/2021   Procedure: LEFT BREAST LUMPECTOMY WITH RADIOACTIVE SEEDS x 2;  Surgeon: Jovita Kussmaul, MD;  Location: [place];  Service: General;  Laterality: Left;   BREAST REDUCTION SURGERY Bilateral 07/24/2021   Procedure: BILATERAL ONCOPLASTIC BREAST REDUCTION;  Surgeon: Wallace Going, DO;  Location: [place];  Service: Plastics;  Laterality: Bilateral;   MASTOPEXY Bilateral 07/24/2021   Procedure: MASTOPEXY;  Surgeon: Wallace Going, DO;  Location: [place];  Service: Plastics;  Laterality: Bilateral;   Patient Active Problem List   Diagnosis Date Noted   Postoperative breast  asymmetry 07/08/2021   Genetic testing 06/06/2021   Family history of breast cancer 05/24/2021   Family history of colon cancer 05/24/2021   Family history of pancreatic cancer 05/24/2021   Ductal carcinoma in situ (DCIS) of left breast 05/20/2021    PCP: Early Osmond, MD  REFERRING PROVIDER: Gardenia Phlegm, NP  REFERRING DIAG: [phone] (ICD-10-CM) - Intraductal carcinoma in situ of left breast   THERAPY DIAG:  Lymphedema, not elsewhere classified  Stiffness of left shoulder, not elsewhere classified  Abnormal posture  Intraductal carcinoma in situ of left breast  ONSET DATE: 12/22/21  Rationale for Evaluation and Treatment: Rehabilitation  SUBJECTIVE:  SUBJECTIVE STATEMENT: It felt better after last session. It was softer.   PERTINENT HISTORY: Patient was diagnosed on 05/14/21 with L breast cancer. It measures 2.8 cm and is located in the upper outer quadrant. 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 06/17/21- L breast lumpectomy and SLNB 0/3, 07/24/21- bilateral oncoplastic breast reduction, Previous hx of  lumpectomy in 2000 on the R side for a nodule (not cancerous), hx of blood clot in 2021  PAIN:  Are you having pain? No  PRECAUTIONS: Other: L breast lymphedema  WEIGHT BEARING RESTRICTIONS: No  FALLS:  Has patient fallen in last 6 months? 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, full time Statistician in speech language pathology   LEISURE: pt recently got Peloton fixed and plans to begin biking again  HAND DOMINANCE: right   PRIOR LEVEL OF FUNCTION: Independent  PATIENT GOALS: to reduce the  pain and tenderness in L breast and learn how to manage lymphedema   OBJECTIVE:  COGNITION: Overall cognitive status: Within functional limits for tasks assessed   PALPATION: Thickened/fibrotic edema in L breast  OBSERVATIONS / OTHER ASSESSMENTS: peau d orange skin of L breast, L breast larger than R  POSTURE:  Forward head and rounded shoulders posture   UPPER EXTREMITY AROM/PROM:   A/PROM RIGHT  05/16/2021    Shoulder extension 76  Shoulder flexion 153  Shoulder abduction 162  Shoulder internal rotation 63  Shoulder external rotation 75                          (Blank rows = not tested)   A/PROM LEFT  05/16/2021 LEFT 02/19/22  Shoulder extension 66 62  Shoulder flexion 153 140  Shoulder abduction 156 112  Shoulder internal rotation 63 55  Shoulder external rotation 74 85                          (Blank rows = not tested       UPPER EXTREMITY STRENGTH: 5/5     LYMPHEDEMA ASSESSMENTS:    LANDMARK RIGHT  05/16/2021 RIGHT 02/19/22  10 cm proximal to olecranon process 38.6 37.5  Olecranon process '31 30  10 '$ cm proximal to ulnar styloid process 27 26  Just proximal to ulnar styloid process 19.7 20  Across hand at thumb web space 22 22  At base of 2nd digit 8 7.8  (Blank rows = not tested)   LANDMARK LEFT  05/16/2021 LEFT 02/19/22  10 cm proximal to olecranon process 38 38.5  Olecranon process '31 31  10 '$ cm proximal to ulnar styloid process 26.9 25.5  Just proximal to ulnar styloid process 20.7 19.4  Across hand at thumb web space 21.5 21.7  At base of 2nd digit 7.5 7.5  (Blank rows = not tested)  LYMPHEDEMA ASSESSMENTS:   SURGERY TYPE/DATE: 06/17/21- L br lumpectomy and SLNB; 07/24/21 - bilateral oncoplastic breast reduction; hx of R lumpectomy in 2000 - non cancerous  NUMBER OF LYMPH NODES REMOVED: 0/3  CHEMOTHERAPY: none  RADIATION:complted on 10/16/21  HORMONE TREATMENT: began 10/2021- on Tamoxifen  INFECTIONS: none   FUNCTIONAL TESTS:  Breast Complaints  Scale-  46     L-DEX LYMPHEDEMA SCREENING:  The patient was assessed using the L-Dex machine today  to produce a lymphedema index baseline score. The patient will be reassessed on a regular basis (typically every 3 months) to obtain new L-Dex scores. If the score is > 6.5 points away from his/her baseline score indicating onset of subclinical lymphedema, it will be recommended to wear a compression garment for 4 weeks, 12 hours per day and then be reassessed. If the score continues to be > 6.5 points from baseline at reassessment, we will initiate lymphedema treatment. Assessing in this manner has a 95% rate of preventing clinically significant lymphedema.     TODAY'S TREATMENT:                                                                                                                                         DATE:  02/21/22: In supine: Short neck, 5 diaphragmatic breaths, R axillary nodes and establishment of interaxillary pathway, L inguinal nodes and establishment of axilloinguinal pathway, then L breast moving fluid towards pathways spending extra time in any areas of fibrosis then to R S/L to work on L lateral trunk moving fluid towards posterior interaxillary pathway and pathway at L trunk towards inguinal nodes then retracing all steps while educating pt and spouse on anatomy and physiology of the lymphatic system and importance of skin stretch. Had pt's spouse return demonstrate each step of the sequence while therapist provided v/c for correct skin stretch and speed. Issued handout for pt and spouse to practice at home.   02/19/22: In supine: Short neck, 5 diaphragmatic breaths, R axillary nodes and establishment of interaxillary pathway, L inguinal nodes and establishment of axilloinguinal pathway, then L breast moving fluid towards pathways spending extra time in any areas of fibrosis then retracing all steps while educating pt on anatomy and physiology of the lymphatic system and  importance of skin stretch. Created foam chip pack for pt to wear in her bra for additional compression  PATIENT EDUCATION:  Education details: anatomy and physiology of the lymphatic system, need for compression bra, chip pack, basic principles of MLD Person educated: Patient Education method: Explanation Education comprehension: verbalized understanding  HOME EXERCISE PROGRAM: Wear compression bra as much as possible  ASSESSMENT:  CLINICAL IMPRESSION: Pt returns to PT and reports she could tell a difference after last session. She reports her breast felt better and was less fibrotic. She still has increased tenderness at lateral breast and trunk. Educated pt's spouse today in MLD technique and had him return demonstrate with spouse demonstrating increased independence by end of session.   OBJECTIVE IMPAIRMENTS: decreased knowledge of condition, decreased knowledge of use of DME, decreased ROM, decreased strength, increased edema, increased fascial restrictions, impaired UE functional use, postural dysfunction, and pain.   ACTIVITY LIMITATIONS: sleeping and reach over head  PARTICIPATION LIMITATIONS:  none  PERSONAL FACTORS: Time since onset of injury/illness/exacerbation are also affecting patient's functional outcome.   REHAB POTENTIAL: Good  CLINICAL DECISION MAKING:  Stable/uncomplicated  EVALUATION COMPLEXITY: Low  GOALS: Goals reviewed with patient? Yes  SHORT TERM GOALS=LONG TERM GOALS Target date: 03/19/22  Pt will be independent in self MLD for long term management of lymphedema.  Baseline: Goal status: INITIAL  2.  Pt will demonstrate decreased pore size and improved skin mobility in L breast to allow improved comfort.  Baseline:  Goal status: INITIAL  3.  Pt will demonstrate 150 degrees of L shoulder abudction to allow her to reach out to the side.  Baseline: 112 Goal status: INITIAL  4.  Pt will be independent in a home exericse program for continued  stretching and strengthening.  Baseline:  Goal status: INITIAL   PLAN:  PT FREQUENCY: 2x/week  PT DURATION: 4 weeks  PLANNED INTERVENTIONS: Therapeutic exercises, Therapeutic activity, Patient/Family education, Self Care, Joint mobilization, Orthotic/Fit training, Manual lymph drainage, Compression bandaging, scar mobilization, Taping, Vasopneumatic device, Manual therapy, and Re-evaluation  PLAN FOR NEXT SESSION: instruct in MLD to L breast, how is compression bra? How was chip pack? ROM to L shoulder both PROM and AAROM, give HEP for this, compression pump?    [employer], PT 02/21/2022, 10:01 AM

## 2022-02-21 NOTE — Patient Instructions (Addendum)
Self manual lymph drainage: Perform this sequence once a day.  Only give enough pressure no your skin to make the skin move.  Diaphragmatic - Supine   Inhale through nose making navel move out toward hands. Exhale through puckered lips, hands follow navel in. Repeat _5__ times. Rest _10__ seconds between repeats.   Copyright  VHI. All rights reserved.  Hug yourself.  Do circles at your neck just above your collarbones.  Repeat this 10 times.  Axilla - One at a Time   Using full weight of flat hand and fingers at center of uninvolved armpit, make _10__ in-place circles.   Copyright  VHI. All rights reserved.  LEG: Inguinal Nodes Stimulation   With small finger side of hand against hip crease on involved side, gently perform circles at the crease. Repeat __10_ times.   Copyright  VHI. All rights reserved.  Axilla to Inguinal Nodes - Sweep   On involved side, stretch skin _4__ times from armpit along side of trunk to hip crease.  Now gently stretch skin from the involved side to the uninvolved side across the chest at the shoulder line.  Repeat that 4 times.  Draw an imaginary diagonal line from upper outer breast through the nipple area toward lower inner breast.  Direct fluid upward and inward from this line toward the pathway across your upper chest .  Do this in three rows to treat all of the upper inner breast tissue, and do each row 3-4x.      Direct fluid to treat all of lower outer breast tissue downward and outward toward pathway that is aimed at the left groin. (Work on breast 25 min)  Finish by doing the pathways as described above going from your involved armpit to the same side groin and going across your upper chest from the involved shoulder to the uninvolved shoulder.  Repeat the steps above where you do circles in your left groin and right armpit. Copyright  VHI. All rights reserved.

## 2022-02-26 ENCOUNTER — Ambulatory Visit: Payer: BC Managed Care – PPO | Admitting: Physical Therapy

## 2022-02-27 ENCOUNTER — Encounter: Payer: Self-pay | Admitting: Physical Therapy

## 2022-02-27 ENCOUNTER — Ambulatory Visit: Payer: BC Managed Care – PPO | Admitting: Physical Therapy

## 2022-02-27 DIAGNOSIS — M25612 Stiffness of left shoulder, not elsewhere classified: Secondary | ICD-10-CM

## 2022-02-27 DIAGNOSIS — I890 Lymphedema, not elsewhere classified: Secondary | ICD-10-CM | POA: Diagnosis not present

## 2022-02-27 DIAGNOSIS — D0512 Intraductal carcinoma in situ of left breast: Secondary | ICD-10-CM

## 2022-02-27 DIAGNOSIS — R293 Abnormal posture: Secondary | ICD-10-CM

## 2022-02-27 NOTE — Therapy (Signed)
OUTPATIENT PHYSICAL THERAPY ONCOLOGY TREATMENT  Patient Name: Joan Dalton MRN: [id number] DOB:1976-02-27, 46 y.o., female Today's Date: 02/27/2022  END OF SESSION:  PT End of Session - 02/27/22 1202     Visit Number 3    Number of Visits 9    Date for PT Re-Evaluation 03/19/22    PT Start Time 9371    PT Stop Time 6967    PT Time Calculation (min) 54 min    Activity Tolerance Patient tolerated treatment well    Behavior During Therapy [facility] for tasks assessed/performed             Past Medical History:  Diagnosis Date   Anemia    Anxiety    Bilateral swelling of feet    Breast cancer ([place]) 05/08/2021   Constipation    Family history of breast cancer    Family history of colon cancer    Family history of pancreatic cancer    Fatty liver    Hx of blood clots    Hypertension    Joint pain    Joint pain    Lactose intolerance    Palpitations    Vitamin D deficiency    Past Surgical History:  Procedure Laterality Date   ABLATION     D&C Ablation of Uterus   AXILLARY SENTINEL NODE BIOPSY Left 06/17/2021   Procedure: LEFT AXILLARY SENTINEL NODE BIOPSY;  Surgeon: Jovita Kussmaul, MD;  Location: [place];  Service: General;  Laterality: Left;   BREAST BIOPSY Left 05/08/2021   BREAST LUMPECTOMY Right 10/1998   BREAST LUMPECTOMY WITH RADIOACTIVE SEED AND SENTINEL LYMPH NODE BIOPSY Left 06/17/2021   Procedure: LEFT BREAST LUMPECTOMY WITH RADIOACTIVE SEEDS x 2;  Surgeon: Jovita Kussmaul, MD;  Location: [place];  Service: General;  Laterality: Left;   BREAST REDUCTION SURGERY Bilateral 07/24/2021   Procedure: BILATERAL ONCOPLASTIC BREAST REDUCTION;  Surgeon: Wallace Going, DO;  Location: [place];  Service: Plastics;  Laterality: Bilateral;   MASTOPEXY Bilateral 07/24/2021   Procedure: MASTOPEXY;  Surgeon: Wallace Going, DO;  Location: [place];  Service: Plastics;  Laterality: Bilateral;   Patient Active Problem List   Diagnosis Date Noted   Postoperative breast  asymmetry 07/08/2021   Genetic testing 06/06/2021   Family history of breast cancer 05/24/2021   Family history of colon cancer 05/24/2021   Family history of pancreatic cancer 05/24/2021   Ductal carcinoma in situ (DCIS) of left breast 05/20/2021    PCP: Early Osmond, MD  REFERRING PROVIDER: Gardenia Phlegm, NP  REFERRING DIAG: [phone] (ICD-10-CM) - Intraductal carcinoma in situ of left breast   THERAPY DIAG:  Lymphedema, not elsewhere classified  Stiffness of left shoulder, not elsewhere classified  Abnormal posture  Intraductal carcinoma in situ of left breast  ONSET DATE: 12/22/21  Rationale for Evaluation and Treatment: Rehabilitation  SUBJECTIVE:  SUBJECTIVE STATEMENT: The breast feels a lot better since I started here. I have been trying to do a little self massage since I was here last.   PERTINENT HISTORY: Patient was diagnosed on 05/14/21 with L breast cancer. It measures 2.8 cm and is located in the upper outer quadrant. 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 06/17/21- L breast lumpectomy and SLNB 0/3, 07/24/21- bilateral oncoplastic breast reduction, Previous hx of  lumpectomy in 2000 on the R side for a nodule (not cancerous), hx of blood clot in 2021  PAIN:  Are you having pain? No  PRECAUTIONS: Other: L breast lymphedema  WEIGHT BEARING RESTRICTIONS: No  FALLS:  Has patient fallen in last 6 months? 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, full time Statistician in speech language pathology   LEISURE: pt recently got Peloton fixed and plans to begin biking again  HAND DOMINANCE: right    PRIOR LEVEL OF FUNCTION: Independent  PATIENT GOALS: to reduce the pain and tenderness in L breast and learn how to manage lymphedema   OBJECTIVE:  COGNITION: Overall cognitive status: Within functional limits for tasks assessed   PALPATION: Thickened/fibrotic edema in L breast  OBSERVATIONS / OTHER ASSESSMENTS: peau d orange skin of L breast, L breast larger than R  POSTURE:  Forward head and rounded shoulders posture   UPPER EXTREMITY AROM/PROM:   A/PROM RIGHT  05/16/2021    Shoulder extension 76  Shoulder flexion 153  Shoulder abduction 162  Shoulder internal rotation 63  Shoulder external rotation 75                          (Blank rows = not tested)   A/PROM LEFT  05/16/2021 LEFT 02/19/22  Shoulder extension 66 62  Shoulder flexion 153 140  Shoulder abduction 156 112  Shoulder internal rotation 63 55  Shoulder external rotation 74 85                          (Blank rows = not tested       UPPER EXTREMITY STRENGTH: 5/5     LYMPHEDEMA ASSESSMENTS:    LANDMARK RIGHT  05/16/2021 RIGHT 02/19/22  10 cm proximal to olecranon process 38.6 37.5  Olecranon process '31 30  10 '$ cm proximal to ulnar styloid process 27 26  Just proximal to ulnar styloid process 19.7 20  Across hand at thumb web space 22 22  At base of 2nd digit 8 7.8  (Blank rows = not tested)   LANDMARK LEFT  05/16/2021 LEFT 02/19/22  10 cm proximal to olecranon process 38 38.5  Olecranon process '31 31  10 '$ cm proximal to ulnar styloid process 26.9 25.5  Just proximal to ulnar styloid process 20.7 19.4  Across hand at thumb web space 21.5 21.7  At base of 2nd digit 7.5 7.5  (Blank rows = not tested)  LYMPHEDEMA ASSESSMENTS:   SURGERY TYPE/DATE: 06/17/21- L br lumpectomy and SLNB; 07/24/21 - bilateral oncoplastic breast reduction; hx of R lumpectomy in 2000 - non cancerous  NUMBER OF LYMPH NODES REMOVED: 0/3  CHEMOTHERAPY: none  RADIATION:complted on 10/16/21  HORMONE TREATMENT: began 10/2021- on  Tamoxifen  INFECTIONS: none   FUNCTIONAL TESTS:  Breast Complaints Scale-  46  L-DEX LYMPHEDEMA SCREENING:  The patient was assessed using the L-Dex machine today to produce a lymphedema index baseline score. The patient will be reassessed on a regular basis (typically every 3 months) to obtain new L-Dex scores. If the score is > 6.5 points away from his/her baseline score indicating onset of subclinical lymphedema, it will be recommended to wear a compression garment for 4 weeks, 12 hours per day and then be reassessed. If the score continues to be > 6.5 points from baseline at reassessment, we will initiate lymphedema treatment. Assessing in this manner has a 95% rate of preventing clinically significant lymphedema.     TODAY'S TREATMENT:                                                                                                                                         DATE:  02/27/22: In supine: Short neck, 5 diaphragmatic breaths, R axillary nodes and establishment of interaxillary pathway, L inguinal nodes and establishment of axilloinguinal pathway, then L breast moving fluid towards pathways spending extra time in any areas of fibrosis then retracing all steps   02/21/22: In supine: Short neck, 5 diaphragmatic breaths, R axillary nodes and establishment of interaxillary pathway, L inguinal nodes and establishment of axilloinguinal pathway, then L breast moving fluid towards pathways spending extra time in any areas of fibrosis then to R S/L to work on L lateral trunk moving fluid towards posterior interaxillary pathway and pathway at L trunk towards inguinal nodes then retracing all steps while educating pt and spouse on anatomy and physiology of the lymphatic system and importance of skin stretch. Had pt's spouse return demonstrate each step of the sequence while therapist provided v/c for correct skin stretch and speed. Issued handout for pt and spouse to practice at home.    02/19/22: In supine: Short neck, 5 diaphragmatic breaths, R axillary nodes and establishment of interaxillary pathway, L inguinal nodes and establishment of axilloinguinal pathway, then L breast moving fluid towards pathways spending extra time in any areas of fibrosis then retracing all steps while educating pt on anatomy and physiology of the lymphatic system and importance of skin stretch. Created foam chip pack for pt to wear in her bra for additional compression  PATIENT EDUCATION:  Education details: anatomy and physiology of the lymphatic system, need for compression bra, chip pack, basic principles of MLD Person educated: Patient Education method: Explanation Education comprehension: verbalized understanding  HOME EXERCISE PROGRAM: Wear compression bra as much as possible  ASSESSMENT:  CLINICAL IMPRESSION: Pt fibrosis has improved significantly since her evaluation. Continued with MLD to L breast today with further reduction of fibrosis, increased softening and decreased pore size noted. Pt has been wearing her compression bra. Encouraged her to stretch her arm at home to help improve ROM.   OBJECTIVE IMPAIRMENTS: decreased knowledge of condition, decreased knowledge of use of DME, decreased ROM, decreased strength, increased  edema, increased fascial restrictions, impaired UE functional use, postural dysfunction, and pain.   ACTIVITY LIMITATIONS: sleeping and reach over head  PARTICIPATION LIMITATIONS:  none  PERSONAL FACTORS: Time since onset of injury/illness/exacerbation are also affecting patient's functional outcome.   REHAB POTENTIAL: Good  CLINICAL DECISION MAKING: Stable/uncomplicated  EVALUATION COMPLEXITY: Low  GOALS: Goals reviewed with patient? Yes  SHORT TERM GOALS=LONG TERM GOALS Target date: 03/19/22  Pt will be independent in self MLD for long term management of lymphedema.  Baseline: Goal status: INITIAL  2.  Pt will demonstrate decreased pore size  and improved skin mobility in L breast to allow improved comfort.  Baseline:  Goal status: INITIAL  3.  Pt will demonstrate 150 degrees of L shoulder abudction to allow her to reach out to the side.  Baseline: 112 Goal status: INITIAL  4.  Pt will be independent in a home exericse program for continued stretching and strengthening.  Baseline:  Goal status: INITIAL   PLAN:  PT FREQUENCY: 2x/week  PT DURATION: 4 weeks  PLANNED INTERVENTIONS: Therapeutic exercises, Therapeutic activity, Patient/Family education, Self Care, Joint mobilization, Orthotic/Fit training, Manual lymph drainage, Compression bandaging, scar mobilization, Taping, Vasopneumatic device, Manual therapy, and Re-evaluation  PLAN FOR NEXT SESSION: instruct in MLD to L breast, how is compression bra? How was chip pack? ROM to L shoulder both PROM and AAROM, give HEP for this, compression pump?    [facility], PT 02/27/2022, 1:11 PM

## 2022-03-04 ENCOUNTER — Encounter: Payer: Self-pay | Admitting: Physical Therapy

## 2022-03-04 ENCOUNTER — Ambulatory Visit: Payer: BC Managed Care – PPO | Admitting: Physical Therapy

## 2022-03-04 DIAGNOSIS — M25612 Stiffness of left shoulder, not elsewhere classified: Secondary | ICD-10-CM

## 2022-03-04 DIAGNOSIS — I890 Lymphedema, not elsewhere classified: Secondary | ICD-10-CM | POA: Diagnosis not present

## 2022-03-04 DIAGNOSIS — D0512 Intraductal carcinoma in situ of left breast: Secondary | ICD-10-CM

## 2022-03-04 DIAGNOSIS — R293 Abnormal posture: Secondary | ICD-10-CM

## 2022-03-04 NOTE — Therapy (Signed)
OUTPATIENT PHYSICAL THERAPY ONCOLOGY TREATMENT  Patient Name: KAEDYNCE TAPP MRN: [id number] DOB:08/19/1975, 46 y.o., female Today's Date: 03/04/2022  END OF SESSION:  PT End of Session - 03/04/22 0905     Visit Number 4    Number of Visits 9    Date for PT Re-Evaluation 03/19/22    PT Start Time 0904    PT Stop Time 1000    PT Time Calculation (min) 56 min    Activity Tolerance Patient tolerated treatment well    Behavior During Therapy [facility] for tasks assessed/performed             Past Medical History:  Diagnosis Date   Anemia    Anxiety    Bilateral swelling of feet    Breast cancer ([place]) 05/08/2021   Constipation    Family history of breast cancer    Family history of colon cancer    Family history of pancreatic cancer    Fatty liver    Hx of blood clots    Hypertension    Joint pain    Joint pain    Lactose intolerance    Palpitations    Vitamin D deficiency    Past Surgical History:  Procedure Laterality Date   ABLATION     D&C Ablation of Uterus   AXILLARY SENTINEL NODE BIOPSY Left 06/17/2021   Procedure: LEFT AXILLARY SENTINEL NODE BIOPSY;  Surgeon: Jovita Kussmaul, MD;  Location: [place];  Service: General;  Laterality: Left;   BREAST BIOPSY Left 05/08/2021   BREAST LUMPECTOMY Right 10/1998   BREAST LUMPECTOMY WITH RADIOACTIVE SEED AND SENTINEL LYMPH NODE BIOPSY Left 06/17/2021   Procedure: LEFT BREAST LUMPECTOMY WITH RADIOACTIVE SEEDS x 2;  Surgeon: Jovita Kussmaul, MD;  Location: [place];  Service: General;  Laterality: Left;   BREAST REDUCTION SURGERY Bilateral 07/24/2021   Procedure: BILATERAL ONCOPLASTIC BREAST REDUCTION;  Surgeon: Wallace Going, DO;  Location: [place];  Service: Plastics;  Laterality: Bilateral;   MASTOPEXY Bilateral 07/24/2021   Procedure: MASTOPEXY;  Surgeon: Wallace Going, DO;  Location: [place];  Service: Plastics;  Laterality: Bilateral;   Patient Active Problem List   Diagnosis Date Noted   Postoperative breast  asymmetry 07/08/2021   Genetic testing 06/06/2021   Family history of breast cancer 05/24/2021   Family history of colon cancer 05/24/2021   Family history of pancreatic cancer 05/24/2021   Ductal carcinoma in situ (DCIS) of left breast 05/20/2021    PCP: Early Osmond, MD  REFERRING PROVIDER: Gardenia Phlegm, NP  REFERRING DIAG: [phone] (ICD-10-CM) - Intraductal carcinoma in situ of left breast   THERAPY DIAG:  Lymphedema, not elsewhere classified  Stiffness of left shoulder, not elsewhere classified  Abnormal posture  Intraductal carcinoma in situ of left breast  ONSET DATE: 12/22/21  Rationale for Evaluation and Treatment: Rehabilitation  SUBJECTIVE:  SUBJECTIVE STATEMENT: The breast Is feeling better. We did the massages on Sunday. Yesterday I tried to stretch and I worked out. It is still soft.   PERTINENT HISTORY: Patient was diagnosed on 05/14/21 with L breast cancer. It measures 2.8 cm and is located in the upper outer quadrant. 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 06/17/21- L breast lumpectomy and SLNB 0/3, 07/24/21- bilateral oncoplastic breast reduction, Previous hx of  lumpectomy in 2000 on the R side for a nodule (not cancerous), hx of blood clot in 2021  PAIN:  Are you having pain? No  PRECAUTIONS: Other: L breast lymphedema  WEIGHT BEARING RESTRICTIONS: No  FALLS:  Has patient fallen in last 6 months? 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, full time Statistician in speech language pathology   LEISURE: pt recently got Peloton fixed and plans to begin biking again  HAND DOMINANCE:  right   PRIOR LEVEL OF FUNCTION: Independent  PATIENT GOALS: to reduce the pain and tenderness in L breast and learn how to manage lymphedema   OBJECTIVE:  COGNITION: Overall cognitive status: Within functional limits for tasks assessed   PALPATION: Thickened/fibrotic edema in L breast  OBSERVATIONS / OTHER ASSESSMENTS: peau d orange skin of L breast, L breast larger than R  POSTURE:  Forward head and rounded shoulders posture   UPPER EXTREMITY AROM/PROM:   A/PROM RIGHT  05/16/2021    Shoulder extension 76  Shoulder flexion 153  Shoulder abduction 162  Shoulder internal rotation 63  Shoulder external rotation 75                          (Blank rows = not tested)   A/PROM LEFT  05/16/2021 LEFT 02/19/22  Shoulder extension 66 62  Shoulder flexion 153 140  Shoulder abduction 156 112  Shoulder internal rotation 63 55  Shoulder external rotation 74 85                          (Blank rows = not tested       UPPER EXTREMITY STRENGTH: 5/5     LYMPHEDEMA ASSESSMENTS:    LANDMARK RIGHT  05/16/2021 RIGHT 02/19/22  10 cm proximal to olecranon process 38.6 37.5  Olecranon process '31 30  10 '$ cm proximal to ulnar styloid process 27 26  Just proximal to ulnar styloid process 19.7 20  Across hand at thumb web space 22 22  At base of 2nd digit 8 7.8  (Blank rows = not tested)   LANDMARK LEFT  05/16/2021 LEFT 02/19/22  10 cm proximal to olecranon process 38 38.5  Olecranon process '31 31  10 '$ cm proximal to ulnar styloid process 26.9 25.5  Just proximal to ulnar styloid process 20.7 19.4  Across hand at thumb web space 21.5 21.7  At base of 2nd digit 7.5 7.5  (Blank rows = not tested)  LYMPHEDEMA ASSESSMENTS:   SURGERY TYPE/DATE: 06/17/21- L br lumpectomy and SLNB; 07/24/21 - bilateral oncoplastic breast reduction; hx of R lumpectomy in 2000 - non cancerous  NUMBER OF LYMPH NODES REMOVED: 0/3  CHEMOTHERAPY: none  RADIATION:complted on 10/16/21  HORMONE TREATMENT: began  10/2021- on Tamoxifen  INFECTIONS: none   FUNCTIONAL TESTS:  Breast Complaints Scale-  46  L-DEX LYMPHEDEMA SCREENING:  The patient was assessed using the L-Dex machine today to produce a lymphedema index baseline score. The patient will be reassessed on a regular basis (typically every 3 months) to obtain new L-Dex scores. If the score is > 6.5 points away from his/her baseline score indicating onset of subclinical lymphedema, it will be recommended to wear a compression garment for 4 weeks, 12 hours per day and then be reassessed. If the score continues to be > 6.5 points from baseline at reassessment, we will initiate lymphedema treatment. Assessing in this manner has a 95% rate of preventing clinically significant lymphedema.     TODAY'S TREATMENT:                                                                                                                                         DATE:  03/04/22: In supine: Short neck, 5 diaphragmatic breaths, R axillary nodes and establishment of interaxillary pathway, L inguinal nodes and establishment of axilloinguinal pathway, then L breast moving fluid towards pathways spending extra time in any areas of fibrosis then retracing all steps. PROM to L shoulder in direction of flexion and abduction.  02/27/22: In supine: Short neck, 5 diaphragmatic breaths, R axillary nodes and establishment of interaxillary pathway, L inguinal nodes and establishment of axilloinguinal pathway, then L breast moving fluid towards pathways spending extra time in any areas of fibrosis then retracing all steps   02/21/22: In supine: Short neck, 5 diaphragmatic breaths, R axillary nodes and establishment of interaxillary pathway, L inguinal nodes and establishment of axilloinguinal pathway, then L breast moving fluid towards pathways spending extra time in any areas of fibrosis then to R S/L to work on L lateral trunk moving fluid towards posterior interaxillary pathway and  pathway at L trunk towards inguinal nodes then retracing all steps while educating pt and spouse on anatomy and physiology of the lymphatic system and importance of skin stretch. Had pt's spouse return demonstrate each step of the sequence while therapist provided v/c for correct skin stretch and speed. Issued handout for pt and spouse to practice at home.   02/19/22: In supine: Short neck, 5 diaphragmatic breaths, R axillary nodes and establishment of interaxillary pathway, L inguinal nodes and establishment of axilloinguinal pathway, then L breast moving fluid towards pathways spending extra time in any areas of fibrosis then retracing all steps while educating pt on anatomy and physiology of the lymphatic system and importance of skin stretch. Created foam chip pack for pt to wear in her bra for additional compression  PATIENT EDUCATION:  Education details: anatomy and physiology of the lymphatic system, need for compression bra, chip pack, basic principles of MLD Person educated: Patient Education method: Explanation Education comprehension: verbalized understanding  HOME EXERCISE PROGRAM: Wear compression bra as much as possible  ASSESSMENT:  CLINICAL IMPRESSION: Pt's breast is getting smaller and her pore size is reducing.  Her breast is much softer than at eval. Began PROM to L shoulder today. Pt has been using the chip pack in her bra and feels that really helps.   OBJECTIVE IMPAIRMENTS: decreased knowledge of condition, decreased knowledge of use of DME, decreased ROM, decreased strength, increased edema, increased fascial restrictions, impaired UE functional use, postural dysfunction, and pain.   ACTIVITY LIMITATIONS: sleeping and reach over head  PARTICIPATION LIMITATIONS:  none  PERSONAL FACTORS: Time since onset of injury/illness/exacerbation are also affecting patient's functional outcome.   REHAB POTENTIAL: Good  CLINICAL DECISION MAKING: Stable/uncomplicated  EVALUATION  COMPLEXITY: Low  GOALS: Goals reviewed with patient? Yes  SHORT TERM GOALS=LONG TERM GOALS Target date: 03/19/22  Pt will be independent in self MLD for long term management of lymphedema.  Baseline: Goal status: INITIAL  2.  Pt will demonstrate decreased pore size and improved skin mobility in L breast to allow improved comfort.  Baseline:  Goal status: INITIAL  3.  Pt will demonstrate 150 degrees of L shoulder abudction to allow her to reach out to the side.  Baseline: 112 Goal status: INITIAL  4.  Pt will be independent in a home exericse program for continued stretching and strengthening.  Baseline:  Goal status: INITIAL   PLAN:  PT FREQUENCY: 2x/week  PT DURATION: 4 weeks  PLANNED INTERVENTIONS: Therapeutic exercises, Therapeutic activity, Patient/Family education, Self Care, Joint mobilization, Orthotic/Fit training, Manual lymph drainage, Compression bandaging, scar mobilization, Taping, Vasopneumatic device, Manual therapy, and Re-evaluation  PLAN FOR NEXT SESSION: instruct in MLD to L breast, how is compression bra? How was chip pack? ROM to L shoulder both PROM and AAROM, give HEP for this, compression pump?    [facility], PT 03/04/2022, 10:59 AM

## 2022-03-04 NOTE — Progress Notes (Signed)
TeleHealth Visit:  Due to the COVID-19 pandemic, this visit was completed with telemedicine (audio/video) technology to reduce patient and provider exposure as well as to preserve personal protective equipment.   Joan Dalton has verbally consented to this TeleHealth visit. The patient is located at home, the provider is located at the [facility] office. The participants in this visit include the listed provider and patient. The visit was conducted today via MyChart Video.    Chief Complaint: OBESITY Joan Dalton is here to discuss her progress with her obesity treatment plan along with follow-up of her obesity related diagnoses. Joan Dalton is on the [employer] and states she is following her eating plan approximately 50% of the time. Joan Dalton states she is walking 20 minutes 2 times per week.  Today's visit was #: 4 Starting weight: 275 lb Starting date: 11/05/2021  Interim History: Joan Dalton has tried to get more consistent activity in and was awaiting her palatine getting fixed.  She does recognize that she has been pressed for time.  As semester winds down she wants to get back on plan.  Trying to incorporate more protein in consistently.  Recognizes she has slowly added more consistent indulgences in.  Subjective:   1. Vitamin D deficiency Joan Dalton is on prescription vitamin D.  She notes fatigue.  2. Insulin resistance Joan Dalton last A1c at 5.6, insulin at 19.4.  She is not on medication.  Assessment/Plan:   1. Vitamin D deficiency We will refill vitamin D 50,000 IUs once every week for 1 month with 0 refills.  -Refill Vitamin D, Ergocalciferol, (DRISDOL) 1.25 MG (50000 UNIT) CAPS capsule; Take 1 capsule (50,000 Units total) by mouth every 7 (seven) days.  Dispense: 4 capsule; Refill: 0  2. Insulin resistance Will repeat labs in January.  3. Obesity with current BMI of 42.8 Joan Dalton is currently in the action stage of change. As such, her goal  is to continue with weight loss efforts. She has agreed to the [employer].   Exercise goals: All adults should avoid inactivity. Some physical activity is better than none, and adults who participate in any amount of physical activity gain some health benefits.  Behavioral modification strategies: increasing lean protein intake, meal planning and cooking strategies, keeping healthy foods in the home, holiday eating strategies , and planning for success.  Joan Dalton has agreed to follow-up with our clinic in 3 weeks. She was informed of the importance of frequent follow-up visits to maximize her success with intensive lifestyle modifications for her multiple health conditions.  Objective:   VITALS: Per patient if applicable, see vitals. GENERAL: Alert and in no acute distress. CARDIOPULMONARY: No increased WOB. Speaking in clear sentences.  PSYCH: Pleasant and cooperative. Speech normal rate and rhythm. Affect is appropriate. Insight and judgement are appropriate. Attention is focused, linear, and appropriate.  NEURO: Oriented as arrived to appointment on time with no prompting.   Lab Results  Component Value Date   CREATININE 0.68 11/05/2021   BUN 8 11/05/2021   NA 138 11/05/2021   K 4.6 11/05/2021   CL 100 11/05/2021   CO2 21 11/05/2021   Lab Results  Component Value Date   ALT 17 11/05/2021   AST 13 11/05/2021   ALKPHOS 68 11/05/2021   BILITOT 0.3 11/05/2021   Lab Results  Component Value Date   HGBA1C 5.6 11/05/2021   Lab Results  Component Value Date   INSULIN 19.4 11/05/2021   Lab Results  Component Value Date   TSH 1.080  11/05/2021   Lab Results  Component Value Date   CHOL 224 (H) 11/05/2021   HDL 74 11/05/2021   LDLCALC 137 (H) 11/05/2021   TRIG 76 11/05/2021   Lab Results  Component Value Date   VD25OH 17.9 (L) 11/05/2021   Lab Results  Component Value Date   WBC 5.0 11/05/2021   HGB 13.3 11/05/2021   HCT 40.0 11/05/2021   MCV 95 11/05/2021    PLT 266 11/05/2021   No results found for: "IRON", "TIBC", "FERRITIN"  Attestation Statements:   Reviewed by clinician on day of visit: allergies, medications, problem list, medical history, surgical history, family history, social history, and previous encounter notes.  I, Elnora Morrison, RMA am acting as transcriptionist for Coralie Common, MD.  I have reviewed the above documentation for accuracy and completeness, and I agree with the above. - Coralie Common, MD

## 2022-03-06 ENCOUNTER — Ambulatory Visit: Payer: BC Managed Care – PPO | Admitting: Physical Therapy

## 2022-03-06 ENCOUNTER — Encounter: Payer: Self-pay | Admitting: Physical Therapy

## 2022-03-06 DIAGNOSIS — R293 Abnormal posture: Secondary | ICD-10-CM

## 2022-03-06 DIAGNOSIS — I890 Lymphedema, not elsewhere classified: Secondary | ICD-10-CM

## 2022-03-06 DIAGNOSIS — D0512 Intraductal carcinoma in situ of left breast: Secondary | ICD-10-CM

## 2022-03-06 DIAGNOSIS — M25612 Stiffness of left shoulder, not elsewhere classified: Secondary | ICD-10-CM

## 2022-03-06 NOTE — Therapy (Signed)
OUTPATIENT PHYSICAL THERAPY ONCOLOGY TREATMENT  Patient Name: Joan Dalton MRN: [id number] DOB:[birth year], 46 y.o., female Today's Date: 03/06/2022  END OF SESSION:  PT End of Session - 03/06/22 0806     Visit Number 5    Number of Visits 9    Date for PT Re-Evaluation 03/19/22    PT Start Time 0805    PT Stop Time 7026    PT Time Calculation (min) 53 min    Activity Tolerance Patient tolerated treatment well    Behavior During Therapy [facility] for tasks assessed/performed             Past Medical History:  Diagnosis Date   Anemia    Anxiety    Bilateral swelling of feet    Breast cancer ([place]) 05/08/2021   Constipation    Family history of breast cancer    Family history of colon cancer    Family history of pancreatic cancer    Fatty liver    Hx of blood clots    Hypertension    Joint pain    Joint pain    Lactose intolerance    Palpitations    Vitamin D deficiency    Past Surgical History:  Procedure Laterality Date   ABLATION     D&C Ablation of Uterus   AXILLARY SENTINEL NODE BIOPSY Left 06/17/2021   Procedure: LEFT AXILLARY SENTINEL NODE BIOPSY;  Surgeon: Jovita Kussmaul, MD;  Location: [place];  Service: General;  Laterality: Left;   BREAST BIOPSY Left 05/08/2021   BREAST LUMPECTOMY Right 10/1998   BREAST LUMPECTOMY WITH RADIOACTIVE SEED AND SENTINEL LYMPH NODE BIOPSY Left 06/17/2021   Procedure: LEFT BREAST LUMPECTOMY WITH RADIOACTIVE SEEDS x 2;  Surgeon: Jovita Kussmaul, MD;  Location: [place];  Service: General;  Laterality: Left;   BREAST REDUCTION SURGERY Bilateral 07/24/2021   Procedure: BILATERAL ONCOPLASTIC BREAST REDUCTION;  Surgeon: Wallace Going, DO;  Location: [place];  Service: Plastics;  Laterality: Bilateral;   MASTOPEXY Bilateral 07/24/2021   Procedure: MASTOPEXY;  Surgeon: Wallace Going, DO;  Location: [place];  Service: Plastics;  Laterality: Bilateral;   Patient Active Problem List   Diagnosis Date Noted   Postoperative breast  asymmetry 07/08/2021   Genetic testing 06/06/2021   Family history of breast cancer 05/24/2021   Family history of colon cancer 05/24/2021   Family history of pancreatic cancer 05/24/2021   Ductal carcinoma in situ (DCIS) of left breast 05/20/2021    PCP: Early Osmond, MD  REFERRING PROVIDER: Gardenia Phlegm, NP  REFERRING DIAG: [phone] (ICD-10-CM) - Intraductal carcinoma in situ of left breast   THERAPY DIAG:  Lymphedema, not elsewhere classified  Stiffness of left shoulder, not elsewhere classified  Abnormal posture  Intraductal carcinoma in situ of left breast  ONSET DATE: 12/22/21  Rationale for Evaluation and Treatment: Rehabilitation  SUBJECTIVE:  SUBJECTIVE STATEMENT: I have been wearing the chip pack in my bra.   PERTINENT HISTORY: Patient was diagnosed on 05/14/21 with L breast cancer. It measures 2.8 cm and is located in the upper outer quadrant. 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 06/17/21- L breast lumpectomy and SLNB 0/3, 07/24/21- bilateral oncoplastic breast reduction, Previous hx of  lumpectomy in 2000 on the R side for a nodule (not cancerous), hx of blood clot in 2021  PAIN:  Are you having pain? No  PRECAUTIONS: Other: L breast lymphedema  WEIGHT BEARING RESTRICTIONS: No  FALLS:  Has patient fallen in last 6 months? No  LIVING ENVIRONMENT: Lives with: lives with their family and lives with their spouse 5 children aged 22, 37, 49, 45, 59 Lives in: House/apartment Has following equipment at home:  None  OCCUPATION:  part time- grad Naval architect, full time Statistician in speech language pathology   LEISURE: pt recently got [employer] fixed and plans to begin biking again  HAND DOMINANCE: right   PRIOR LEVEL OF FUNCTION: Independent  PATIENT GOALS: to reduce the pain  and tenderness in L breast and learn how to manage lymphedema   OBJECTIVE:  COGNITION: Overall cognitive status: Within functional limits for tasks assessed   PALPATION: Thickened/fibrotic edema in L breast  OBSERVATIONS / OTHER ASSESSMENTS: peau d orange skin of L breast, L breast larger than R  POSTURE:  Forward head and rounded shoulders posture   UPPER EXTREMITY AROM/PROM:   A/PROM RIGHT  05/16/2021    Shoulder extension 76  Shoulder flexion 153  Shoulder abduction 162  Shoulder internal rotation 63  Shoulder external rotation 75                          (Blank rows = not tested)   A/PROM LEFT  05/16/2021 LEFT 02/19/22  Shoulder extension 66 62  Shoulder flexion 153 140  Shoulder abduction 156 112  Shoulder internal rotation 63 55  Shoulder external rotation 74 85                          (Blank rows = not tested       UPPER EXTREMITY STRENGTH: 5/5     LYMPHEDEMA ASSESSMENTS:    LANDMARK RIGHT  05/16/2021 RIGHT 02/19/22  10 cm proximal to olecranon process 38.6 37.5  Olecranon process '31 30  10 '$ cm proximal to ulnar styloid process 27 26  Just proximal to ulnar styloid process 19.7 20  Across hand at thumb web space 22 22  At base of 2nd digit 8 7.8  (Blank rows = not tested)   LANDMARK LEFT  05/16/2021 LEFT 02/19/22  10 cm proximal to olecranon process 38 38.5  Olecranon process '31 31  10 '$ cm proximal to ulnar styloid process 26.9 25.5  Just proximal to ulnar styloid process 20.7 19.4  Across hand at thumb web space 21.5 21.7  At base of 2nd digit 7.5 7.5  (Blank rows = not tested)  LYMPHEDEMA ASSESSMENTS:   SURGERY TYPE/DATE: 06/17/21- L br lumpectomy and SLNB; 07/24/21 - bilateral oncoplastic breast reduction; hx of R lumpectomy in 2000 - non cancerous  NUMBER OF LYMPH NODES REMOVED: 0/3  CHEMOTHERAPY: none  RADIATION:complted on 10/16/21  HORMONE TREATMENT: began 10/2021- on Tamoxifen  INFECTIONS: none   FUNCTIONAL TESTS:  Breast Complaints  Scale-  46     L-DEX LYMPHEDEMA SCREENING:  The patient was assessed using the L-Dex machine  today to produce a lymphedema index baseline score. The patient will be reassessed on a regular basis (typically every 3 months) to obtain new L-Dex scores. If the score is > 6.5 points away from his/her baseline score indicating onset of subclinical lymphedema, it will be recommended to wear a compression garment for 4 weeks, 12 hours per day and then be reassessed. If the score continues to be > 6.5 points from baseline at reassessment, we will initiate lymphedema treatment. Assessing in this manner has a 95% rate of preventing clinically significant lymphedema.     TODAY'S TREATMENT:                                                                                                                                         DATE:  03/06/22: In supine: Short neck, 5 diaphragmatic breaths, R axillary nodes and establishment of interaxillary pathway, L inguinal nodes and establishment of axilloinguinal pathway, then L breast moving fluid towards pathways spending extra time in any areas of fibrosis then retracing all steps.Issued pt info on obtaining a solaris swell spot for her bra to replace the chip pack since it can be washed.   03/04/22: In supine: Short neck, 5 diaphragmatic breaths, R axillary nodes and establishment of interaxillary pathway, L inguinal nodes and establishment of axilloinguinal pathway, then L breast moving fluid towards pathways spending extra time in any areas of fibrosis then retracing all steps. PROM to L shoulder in direction of flexion and abduction.  02/27/22: In supine: Short neck, 5 diaphragmatic breaths, R axillary nodes and establishment of interaxillary pathway, L inguinal nodes and establishment of axilloinguinal pathway, then L breast moving fluid towards pathways spending extra time in any areas of fibrosis then retracing all steps    PATIENT EDUCATION:  Education details:  anatomy and physiology of the lymphatic system, need for compression bra, chip pack, basic principles of MLD Person educated: Patient Education method: Explanation Education comprehension: verbalized understanding  HOME EXERCISE PROGRAM: Wear compression bra as much as possible  ASSESSMENT:  CLINICAL IMPRESSION: Pt's breast continues to be less fibrotic and is responding well to MLD. Educated pt today in the use of swell spots for long term management of fibrosis. Gave pt info on obtaining either the whole breast swell spot for the long oval swell spot. Will remeasure ROM at next session and continue to focus on breast MLD.    OBJECTIVE IMPAIRMENTS: decreased knowledge of condition, decreased knowledge of use of DME, decreased ROM, decreased strength, increased edema, increased fascial restrictions, impaired UE functional use, postural dysfunction, and pain.   ACTIVITY LIMITATIONS: sleeping and reach over head  PARTICIPATION LIMITATIONS:  none  PERSONAL FACTORS: Time since onset of injury/illness/exacerbation are also affecting patient's functional outcome.   REHAB POTENTIAL: Good  CLINICAL DECISION MAKING: Stable/uncomplicated  EVALUATION COMPLEXITY: Low  GOALS: Goals reviewed with patient? Yes  SHORT TERM GOALS=LONG TERM GOALS Target date:  03/19/22  Pt will be independent in self MLD for long term management of lymphedema.  Baseline: Goal status: INITIAL  2.  Pt will demonstrate decreased pore size and improved skin mobility in L breast to allow improved comfort.  Baseline:  Goal status: INITIAL  3.  Pt will demonstrate 150 degrees of L shoulder abudction to allow her to reach out to the side.  Baseline: 112 Goal status: INITIAL  4.  Pt will be independent in a home exericse program for continued stretching and strengthening.  Baseline:  Goal status: INITIAL   PLAN:  PT FREQUENCY: 2x/week  PT DURATION: 4 weeks  PLANNED INTERVENTIONS: Therapeutic exercises,  Therapeutic activity, Patient/Family education, Self Care, Joint mobilization, Orthotic/Fit training, Manual lymph drainage, Compression bandaging, scar mobilization, Taping, Vasopneumatic device, Manual therapy, and Re-evaluation  PLAN FOR NEXT SESSION: remeasure L shoulder ROM, instruct in MLD to L breast, how is compression bra? How was chip pack? ROM to L shoulder both PROM and AAROM, give HEP for this, compression pump?    Allyson Sabal [place], PT 03/06/2022, 9:33 AM

## 2022-03-11 ENCOUNTER — Ambulatory Visit: Payer: BC Managed Care – PPO | Admitting: Physical Therapy

## 2022-03-11 ENCOUNTER — Encounter: Payer: Self-pay | Admitting: Physical Therapy

## 2022-03-11 DIAGNOSIS — M25612 Stiffness of left shoulder, not elsewhere classified: Secondary | ICD-10-CM

## 2022-03-11 DIAGNOSIS — R293 Abnormal posture: Secondary | ICD-10-CM

## 2022-03-11 DIAGNOSIS — I890 Lymphedema, not elsewhere classified: Secondary | ICD-10-CM | POA: Diagnosis not present

## 2022-03-11 DIAGNOSIS — D0512 Intraductal carcinoma in situ of left breast: Secondary | ICD-10-CM

## 2022-03-11 NOTE — Therapy (Signed)
OUTPATIENT PHYSICAL THERAPY ONCOLOGY TREATMENT  Patient Name: Joan Dalton MRN: [id number] DOB:13-Oct-1975, 46 y.o., female Today's Date: 03/11/2022  END OF SESSION:  PT End of Session - 03/11/22 0906     Visit Number 6    Number of Visits 9    Date for PT Re-Evaluation 03/19/22    PT Start Time 0906    PT Stop Time 0958    PT Time Calculation (min) 52 min    Activity Tolerance Patient tolerated treatment well    Behavior During Therapy [facility] for tasks assessed/performed             Past Medical History:  Diagnosis Date   Anemia    Anxiety    Bilateral swelling of feet    Breast cancer ([place]) 05/08/2021   Constipation    Family history of breast cancer    Family history of colon cancer    Family history of pancreatic cancer    Fatty liver    Hx of blood clots    Hypertension    Joint pain    Joint pain    Lactose intolerance    Palpitations    Vitamin D deficiency    Past Surgical History:  Procedure Laterality Date   ABLATION     D&C Ablation of Uterus   AXILLARY SENTINEL NODE BIOPSY Left 06/17/2021   Procedure: LEFT AXILLARY SENTINEL NODE BIOPSY;  Surgeon: Jovita Kussmaul, MD;  Location: [place];  Service: General;  Laterality: Left;   BREAST BIOPSY Left 05/08/2021   BREAST LUMPECTOMY Right 10/1998   BREAST LUMPECTOMY WITH RADIOACTIVE SEED AND SENTINEL LYMPH NODE BIOPSY Left 06/17/2021   Procedure: LEFT BREAST LUMPECTOMY WITH RADIOACTIVE SEEDS x 2;  Surgeon: Jovita Kussmaul, MD;  Location: [place];  Service: General;  Laterality: Left;   BREAST REDUCTION SURGERY Bilateral 07/24/2021   Procedure: BILATERAL ONCOPLASTIC BREAST REDUCTION;  Surgeon: Wallace Going, DO;  Location: [place];  Service: Plastics;  Laterality: Bilateral;   MASTOPEXY Bilateral 07/24/2021   Procedure: MASTOPEXY;  Surgeon: Wallace Going, DO;  Location: [place];  Service: Plastics;  Laterality: Bilateral;   Patient Active Problem List   Diagnosis Date Noted   Postoperative breast  asymmetry 07/08/2021   Genetic testing 06/06/2021   Family history of breast cancer 05/24/2021   Family history of colon cancer 05/24/2021   Family history of pancreatic cancer 05/24/2021   Ductal carcinoma in situ (DCIS) of left breast 05/20/2021    PCP: Early Osmond, MD  REFERRING PROVIDER: Gardenia Phlegm, NP  REFERRING DIAG: [phone] (ICD-10-CM) - Intraductal carcinoma in situ of left breast   THERAPY DIAG:  Lymphedema, not elsewhere classified  Stiffness of left shoulder, not elsewhere classified  Abnormal posture  Intraductal carcinoma in situ of left breast  ONSET DATE: 12/22/21  Rationale for Evaluation and Treatment: Rehabilitation  SUBJECTIVE:  SUBJECTIVE STATEMENT: My breast is feeling fine. I am going to order the swell spot. I have been doing the massages.  PERTINENT HISTORY: Patient was diagnosed on 05/14/21 with L breast cancer. It measures 2.8 cm and is located in the upper outer quadrant. 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 06/17/21- L breast lumpectomy and SLNB 0/3, 07/24/21- bilateral oncoplastic breast reduction, Previous hx of  lumpectomy in 2000 on the R side for a nodule (not cancerous), hx of blood clot in 2021  PAIN:  Are you having pain? No  PRECAUTIONS: Other: L breast lymphedema  WEIGHT BEARING RESTRICTIONS: No  FALLS:  Has patient fallen in last 6 months? 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, full time Statistician in speech language pathology   LEISURE: pt recently got Peloton fixed and plans to begin biking again  HAND DOMINANCE: right   PRIOR LEVEL OF FUNCTION:  Independent  PATIENT GOALS: to reduce the pain and tenderness in L breast and learn how to manage lymphedema   OBJECTIVE:  COGNITION: Overall cognitive status: Within functional limits for tasks assessed   PALPATION: Thickened/fibrotic edema in L breast  OBSERVATIONS / OTHER ASSESSMENTS: peau d orange skin of L breast, L breast larger than R  POSTURE:  Forward head and rounded shoulders posture   UPPER EXTREMITY AROM/PROM:   A/PROM RIGHT  05/16/2021    Shoulder extension 76  Shoulder flexion 153  Shoulder abduction 162  Shoulder internal rotation 63  Shoulder external rotation 75                          (Blank rows = not tested)   A/PROM LEFT  05/16/2021 LEFT 02/19/22 LEFT 03/11/22  Shoulder extension 66 62   Shoulder flexion 153 140 155  Shoulder abduction 156 112 146  Shoulder internal rotation 63 55   Shoulder external rotation 74 85                           (Blank rows = not tested       UPPER EXTREMITY STRENGTH: 5/5     LYMPHEDEMA ASSESSMENTS:    LANDMARK RIGHT  05/16/2021 RIGHT 02/19/22  10 cm proximal to olecranon process 38.6 37.5  Olecranon process '31 30  10 '$ cm proximal to ulnar styloid process 27 26  Just proximal to ulnar styloid process 19.7 20  Across hand at thumb web space 22 22  At base of 2nd digit 8 7.8  (Blank rows = not tested)   LANDMARK LEFT  05/16/2021 LEFT 02/19/22  10 cm proximal to olecranon process 38 38.5  Olecranon process '31 31  10 '$ cm proximal to ulnar styloid process 26.9 25.5  Just proximal to ulnar styloid process 20.7 19.4  Across hand at thumb web space 21.5 21.7  At base of 2nd digit 7.5 7.5  (Blank rows = not tested)  LYMPHEDEMA ASSESSMENTS:   SURGERY TYPE/DATE: 06/17/21- L br lumpectomy and SLNB; 07/24/21 - bilateral oncoplastic breast reduction; hx of R lumpectomy in 2000 - non cancerous  NUMBER OF LYMPH NODES REMOVED: 0/3  CHEMOTHERAPY: none  RADIATION:complted on 10/16/21  HORMONE TREATMENT: began 10/2021- on  Tamoxifen  INFECTIONS: none   FUNCTIONAL TESTS:  Breast Complaints Scale-  46  L-DEX LYMPHEDEMA SCREENING:  The patient was assessed using the L-Dex machine today to produce a lymphedema index baseline score. The patient will be reassessed on a regular basis (typically every 3 months) to obtain new L-Dex scores. If the score is > 6.5 points away from his/her baseline score indicating onset of subclinical lymphedema, it will be recommended to wear a compression garment for 4 weeks, 12 hours per day and then be reassessed. If the score continues to be > 6.5 points from baseline at reassessment, we will initiate lymphedema treatment. Assessing in this manner has a 95% rate of preventing clinically significant lymphedema.     TODAY'S TREATMENT:                                                                                                                                         DATE:  03/11/22: In supine: Short neck, 5 diaphragmatic breaths, R axillary nodes and establishment of interaxillary pathway, L inguinal nodes and establishment of axilloinguinal pathway, then L breast moving fluid towards pathways spending extra time in any areas of fibrosis then retracing all steps. Soft tissue mobilization using cocoa butter to scar tissue and muscle tightness in L axilla and area inferior to L axilla with numerous areas of tightness and tenderness noted.  03/06/22: In supine: Short neck, 5 diaphragmatic breaths, R axillary nodes and establishment of interaxillary pathway, L inguinal nodes and establishment of axilloinguinal pathway, then L breast moving fluid towards pathways spending extra time in any areas of fibrosis then retracing all steps.Issued pt info on obtaining a solaris swell spot for her bra to replace the chip pack since it can be washed.   03/04/22: In supine: Short neck, 5 diaphragmatic breaths, R axillary nodes and establishment of interaxillary pathway, L inguinal nodes and  establishment of axilloinguinal pathway, then L breast moving fluid towards pathways spending extra time in any areas of fibrosis then retracing all steps. PROM to L shoulder in direction of flexion and abduction.  02/27/22: In supine: Short neck, 5 diaphragmatic breaths, R axillary nodes and establishment of interaxillary pathway, L inguinal nodes and establishment of axilloinguinal pathway, then L breast moving fluid towards pathways spending extra time in any areas of fibrosis then retracing all steps    PATIENT EDUCATION:  Education details: anatomy and physiology of the lymphatic system, need for compression bra, chip pack, basic principles of MLD Person educated: Patient Education method: Explanation Education comprehension: verbalized understanding  HOME EXERCISE PROGRAM: Wear compression bra as much as possible  ASSESSMENT:  CLINICAL IMPRESSION: Pt's breast continues to be less fibrotic and is responding well to MLD. Remeasured L shoulder ROM and it has improved since surgery but her abduction is still limited. Began soft tissue mobilization to area of tenderness and tightness in L axilla.   OBJECTIVE IMPAIRMENTS: decreased knowledge of condition, decreased knowledge of use of DME, decreased ROM, decreased strength, increased edema,  increased fascial restrictions, impaired UE functional use, postural dysfunction, and pain.   ACTIVITY LIMITATIONS: sleeping and reach over head  PARTICIPATION LIMITATIONS:  none  PERSONAL FACTORS: Time since onset of injury/illness/exacerbation are also affecting patient's functional outcome.   REHAB POTENTIAL: Good  CLINICAL DECISION MAKING: Stable/uncomplicated  EVALUATION COMPLEXITY: Low  GOALS: Goals reviewed with patient? Yes  SHORT TERM GOALS=LONG TERM GOALS Target date: 03/19/22  Pt will be independent in self MLD for long term management of lymphedema.  Baseline: Goal status: INITIAL  2.  Pt will demonstrate decreased pore size  and improved skin mobility in L breast to allow improved comfort.  Baseline:  Goal status: INITIAL  3.  Pt will demonstrate 150 degrees of L shoulder abudction to allow her to reach out to the side.  Baseline: 112 Goal status: INITIAL  4.  Pt will be independent in a home exericse program for continued stretching and strengthening.  Baseline:  Goal status: INITIAL   PLAN:  PT FREQUENCY: 2x/week  PT DURATION: 4 weeks  PLANNED INTERVENTIONS: Therapeutic exercises, Therapeutic activity, Patient/Family education, Self Care, Joint mobilization, Orthotic/Fit training, Manual lymph drainage, Compression bandaging, scar mobilization, Taping, Vasopneumatic device, Manual therapy, and Re-evaluation  PLAN FOR NEXT SESSION: remeasure L shoulder ROM, instruct in MLD to L breast, how is compression bra? How was chip pack? ROM to L shoulder both PROM and AAROM, give HEP for this, compression pump?    [employer], PT 03/11/2022, 10:04 AM

## 2022-03-13 ENCOUNTER — Encounter: Payer: Self-pay | Admitting: Physical Therapy

## 2022-03-13 ENCOUNTER — Ambulatory Visit: Payer: BC Managed Care – PPO | Admitting: Physical Therapy

## 2022-03-13 ENCOUNTER — Ambulatory Visit (INDEPENDENT_AMBULATORY_CARE_PROVIDER_SITE_OTHER): Payer: BC Managed Care – PPO | Admitting: Family Medicine

## 2022-03-13 VITALS — BP 145/89 | HR 87 | Temp 98.4°F | Ht 66.0 in | Wt 263.0 lb

## 2022-03-13 DIAGNOSIS — D0512 Intraductal carcinoma in situ of left breast: Secondary | ICD-10-CM

## 2022-03-13 DIAGNOSIS — E559 Vitamin D deficiency, unspecified: Secondary | ICD-10-CM

## 2022-03-13 DIAGNOSIS — R293 Abnormal posture: Secondary | ICD-10-CM

## 2022-03-13 DIAGNOSIS — M25612 Stiffness of left shoulder, not elsewhere classified: Secondary | ICD-10-CM

## 2022-03-13 DIAGNOSIS — E669 Obesity, unspecified: Secondary | ICD-10-CM

## 2022-03-13 DIAGNOSIS — I890 Lymphedema, not elsewhere classified: Secondary | ICD-10-CM | POA: Diagnosis not present

## 2022-03-13 DIAGNOSIS — Z6841 Body Mass Index (BMI) 40.0 and over, adult: Secondary | ICD-10-CM

## 2022-03-13 DIAGNOSIS — E7849 Other hyperlipidemia: Secondary | ICD-10-CM

## 2022-03-13 MED ORDER — VITAMIN D (ERGOCALCIFEROL) 1.25 MG (50000 UNIT) PO CAPS: 50000.0000 [IU] | ORAL_CAPSULE | ORAL | 0 refills | Status: DC

## 2022-03-13 NOTE — Therapy (Signed)
OUTPATIENT PHYSICAL THERAPY ONCOLOGY TREATMENT  Patient Name: SHANTALE HOLTMEYER MRN: [id number] DOB:05-Aug-1975, 46 y.o., female Today's Date: 03/13/2022  END OF SESSION:  PT End of Session - 03/13/22 1306     Visit Number 7    Number of Visits 9    Date for PT Re-Evaluation 03/19/22    PT Start Time 4627    PT Stop Time 0350    PT Time Calculation (min) 50 min    Activity Tolerance Patient tolerated treatment well    Behavior During Therapy [facility] for tasks assessed/performed             Past Medical History:  Diagnosis Date   Anemia    Anxiety    Bilateral swelling of feet    Breast cancer ([place]) 05/08/2021   Constipation    Family history of breast cancer    Family history of colon cancer    Family history of pancreatic cancer    Fatty liver    Hx of blood clots    Hypertension    Joint pain    Joint pain    Lactose intolerance    Palpitations    Vitamin D deficiency    Past Surgical History:  Procedure Laterality Date   ABLATION     D&C Ablation of Uterus   AXILLARY SENTINEL NODE BIOPSY Left 06/17/2021   Procedure: LEFT AXILLARY SENTINEL NODE BIOPSY;  Surgeon: Jovita Kussmaul, MD;  Location: [place];  Service: General;  Laterality: Left;   BREAST BIOPSY Left 05/08/2021   BREAST LUMPECTOMY Right 10/1998   BREAST LUMPECTOMY WITH RADIOACTIVE SEED AND SENTINEL LYMPH NODE BIOPSY Left 06/17/2021   Procedure: LEFT BREAST LUMPECTOMY WITH RADIOACTIVE SEEDS x 2;  Surgeon: Jovita Kussmaul, MD;  Location: [place];  Service: General;  Laterality: Left;   BREAST REDUCTION SURGERY Bilateral 07/24/2021   Procedure: BILATERAL ONCOPLASTIC BREAST REDUCTION;  Surgeon: Wallace Going, DO;  Location: [place];  Service: Plastics;  Laterality: Bilateral;   MASTOPEXY Bilateral 07/24/2021   Procedure: MASTOPEXY;  Surgeon: Wallace Going, DO;  Location: [place];  Service: Plastics;  Laterality: Bilateral;   Patient Active Problem List   Diagnosis Date Noted   Postoperative breast  asymmetry 07/08/2021   Genetic testing 06/06/2021   Family history of breast cancer 05/24/2021   Family history of colon cancer 05/24/2021   Family history of pancreatic cancer 05/24/2021   Ductal carcinoma in situ (DCIS) of left breast 05/20/2021    PCP: Early Osmond, MD  REFERRING PROVIDER: Gardenia Phlegm, NP  REFERRING DIAG: [phone] (ICD-10-CM) - Intraductal carcinoma in situ of left breast   THERAPY DIAG:  Lymphedema, not elsewhere classified  Stiffness of left shoulder, not elsewhere classified  Abnormal posture  Intraductal carcinoma in situ of left breast  ONSET DATE: 12/22/21  Rationale for Evaluation and Treatment: Rehabilitation  SUBJECTIVE:  SUBJECTIVE STATEMENT: I am wondering if that medicine is causing pain.   PERTINENT HISTORY: Patient was diagnosed on 05/14/21 with L breast cancer. It measures 2.8 cm and is located in the upper outer quadrant. 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 06/17/21- L breast lumpectomy and SLNB 0/3, 07/24/21- bilateral oncoplastic breast reduction, Previous hx of  lumpectomy in 2000 on the R side for a nodule (not cancerous), hx of blood clot in 2021  PAIN:  Are you having pain? Yes R elbow and forearm, 7/10, tenderness, radiates to hand, unsure what helps, pulling, twisting, grabbing makes it worse  PRECAUTIONS: Other: L breast lymphedema  WEIGHT BEARING RESTRICTIONS: No  FALLS:  Has patient fallen in last 6 months? 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, full time Statistician in speech language pathology   LEISURE: pt recently got Peloton fixed and plans  to begin biking again  HAND DOMINANCE: right   PRIOR LEVEL OF FUNCTION: Independent  PATIENT GOALS: to reduce the pain and tenderness in L breast and learn how to manage lymphedema   OBJECTIVE:  COGNITION: Overall cognitive status: Within functional limits for tasks assessed   PALPATION: Thickened/fibrotic edema in L breast  OBSERVATIONS / OTHER ASSESSMENTS: peau d orange skin of L breast, L breast larger than R  POSTURE:  Forward head and rounded shoulders posture   UPPER EXTREMITY AROM/PROM:   A/PROM RIGHT  05/16/2021    Shoulder extension 76  Shoulder flexion 153  Shoulder abduction 162  Shoulder internal rotation 63  Shoulder external rotation 75                          (Blank rows = not tested)   A/PROM LEFT  05/16/2021 LEFT 02/19/22 LEFT 03/11/22  Shoulder extension 66 62   Shoulder flexion 153 140 155  Shoulder abduction 156 112 146  Shoulder internal rotation 63 55   Shoulder external rotation 74 85                           (Blank rows = not tested       UPPER EXTREMITY STRENGTH: 5/5     LYMPHEDEMA ASSESSMENTS:    LANDMARK RIGHT  05/16/2021 RIGHT 02/19/22  10 cm proximal to olecranon process 38.6 37.5  Olecranon process '31 30  10 '$ cm proximal to ulnar styloid process 27 26  Just proximal to ulnar styloid process 19.7 20  Across hand at thumb web space 22 22  At base of 2nd digit 8 7.8  (Blank rows = not tested)   LANDMARK LEFT  05/16/2021 LEFT 02/19/22  10 cm proximal to olecranon process 38 38.5  Olecranon process '31 31  10 '$ cm proximal to ulnar styloid process 26.9 25.5  Just proximal to ulnar styloid process 20.7 19.4  Across hand at thumb web space 21.5 21.7  At base of 2nd digit 7.5 7.5  (Blank rows = not tested)  LYMPHEDEMA ASSESSMENTS:   SURGERY TYPE/DATE: 06/17/21- L br lumpectomy and SLNB; 07/24/21 - bilateral oncoplastic breast reduction; hx of R lumpectomy in 2000 - non cancerous  NUMBER OF LYMPH NODES REMOVED: 0/3  CHEMOTHERAPY:  none  RADIATION:complted on 10/16/21  HORMONE TREATMENT: began 10/2021- on Tamoxifen  INFECTIONS: none  FUNCTIONAL TESTS:  Breast Complaints Scale-  46     L-DEX LYMPHEDEMA SCREENING:  The patient was assessed using the L-Dex machine today to produce a lymphedema index baseline score. The patient will be reassessed on a regular basis (typically every 3 months) to obtain new L-Dex scores. If the score is > 6.5 points away from his/her baseline score indicating onset of subclinical lymphedema, it will be recommended to wear a compression garment for 4 weeks, 12 hours per day and then be reassessed. If the score continues to be > 6.5 points from baseline at reassessment, we will initiate lymphedema treatment. Assessing in this manner has a 95% rate of preventing clinically significant lymphedema.     TODAY'S TREATMENT:                                                                                                                                         DATE:  03/13/22: In supine: Short neck, 5 diaphragmatic breaths, R axillary nodes and establishment of interaxillary pathway, L inguinal nodes and establishment of axilloinguinal pathway, then L breast moving fluid towards pathways spending extra time in any areas of fibrosis then retracing all steps.   03/11/22: In supine: Short neck, 5 diaphragmatic breaths, R axillary nodes and establishment of interaxillary pathway, L inguinal nodes and establishment of axilloinguinal pathway, then L breast moving fluid towards pathways spending extra time in any areas of fibrosis then retracing all steps. Soft tissue mobilization using cocoa butter to scar tissue and muscle tightness in L axilla and area inferior to L axilla with numerous areas of tightness and tenderness noted.  03/06/22: In supine: Short neck, 5 diaphragmatic breaths, R axillary nodes and establishment of interaxillary pathway, L inguinal nodes and establishment of axilloinguinal  pathway, then L breast moving fluid towards pathways spending extra time in any areas of fibrosis then retracing all steps.Issued pt info on obtaining a solaris swell spot for her bra to replace the chip pack since it can be washed.   03/04/22: In supine: Short neck, 5 diaphragmatic breaths, R axillary nodes and establishment of interaxillary pathway, L inguinal nodes and establishment of axilloinguinal pathway, then L breast moving fluid towards pathways spending extra time in any areas of fibrosis then retracing all steps. PROM to L shoulder in direction of flexion and abduction.  02/27/22: In supine: Short neck, 5 diaphragmatic breaths, R axillary nodes and establishment of interaxillary pathway, L inguinal nodes and establishment of axilloinguinal pathway, then L breast moving fluid towards pathways spending extra time in any areas of fibrosis then retracing all steps    PATIENT EDUCATION:  Education details: anatomy and physiology of the lymphatic system, need for compression bra, chip pack, basic principles of MLD Person educated: Patient Education method: Explanation Education comprehension: verbalized understanding  HOME EXERCISE PROGRAM: Wear compression bra as much as possible  ASSESSMENT:  CLINICAL IMPRESSION: Pt's breast continues to be  less fibrotic and is responding well to MLD. Continued with MLD with focus on medial and inferior breast breast to continue to soften and move fluid,   OBJECTIVE IMPAIRMENTS: decreased knowledge of condition, decreased knowledge of use of DME, decreased ROM, decreased strength, increased edema, increased fascial restrictions, impaired UE functional use, postural dysfunction, and pain.   ACTIVITY LIMITATIONS: sleeping and reach over head  PARTICIPATION LIMITATIONS:  none  PERSONAL FACTORS: Time since onset of injury/illness/exacerbation are also affecting patient's functional outcome.   REHAB POTENTIAL: Good  CLINICAL DECISION MAKING:  Stable/uncomplicated  EVALUATION COMPLEXITY: Low  GOALS: Goals reviewed with patient? Yes  SHORT TERM GOALS=LONG TERM GOALS Target date: 03/19/22  Pt will be independent in self MLD for long term management of lymphedema.  Baseline: Goal status: INITIAL  2.  Pt will demonstrate decreased pore size and improved skin mobility in L breast to allow improved comfort.  Baseline:  Goal status: INITIAL  3.  Pt will demonstrate 150 degrees of L shoulder abudction to allow her to reach out to the side.  Baseline: 112 Goal status: INITIAL  4.  Pt will be independent in a home exericse program for continued stretching and strengthening.  Baseline:  Goal status: INITIAL   PLAN:  PT FREQUENCY: 2x/week  PT DURATION: 4 weeks  PLANNED INTERVENTIONS: Therapeutic exercises, Therapeutic activity, Patient/Family education, Self Care, Joint mobilization, Orthotic/Fit training, Manual lymph drainage, Compression bandaging, scar mobilization, Taping, Vasopneumatic device, Manual therapy, and Re-evaluation  PLAN FOR NEXT SESSION: STM to lateral trunk, remeasure L shoulder ROM, instruct in MLD to L breast, how is compression bra? How was chip pack? ROM to L shoulder both PROM and AAROM, give HEP for this, compression pump?    Allyson Sabal [place], PT 03/13/2022, 1:57 PM

## 2022-03-26 ENCOUNTER — Ambulatory Visit: Payer: BC Managed Care – PPO | Attending: Adult Health

## 2022-03-26 DIAGNOSIS — I890 Lymphedema, not elsewhere classified: Secondary | ICD-10-CM | POA: Insufficient documentation

## 2022-03-26 DIAGNOSIS — M25612 Stiffness of left shoulder, not elsewhere classified: Secondary | ICD-10-CM

## 2022-03-26 DIAGNOSIS — R293 Abnormal posture: Secondary | ICD-10-CM

## 2022-03-26 DIAGNOSIS — D0512 Intraductal carcinoma in situ of left breast: Secondary | ICD-10-CM | POA: Diagnosis present

## 2022-03-26 NOTE — Therapy (Signed)
OUTPATIENT PHYSICAL THERAPY ONCOLOGY TREATMENT  Patient Name: Joan Dalton MRN: [id number] DOB:[birth year], 47 y.o., female Today's Date: 03/26/2022  END OF SESSION:  PT End of Session - 03/26/22 1224     Visit Number 8    Number of Visits 17    Date for PT Re-Evaluation 04/23/22    PT Start Time 1220   pt arrived late from another appt   PT Stop Time 1303    PT Time Calculation (min) 43 min    Activity Tolerance Patient tolerated treatment well    Behavior During Therapy [facility] for tasks assessed/performed             Past Medical History:  Diagnosis Date   Anemia    Anxiety    Bilateral swelling of feet    Breast cancer ([place]) 05/08/2021   Constipation    Family history of breast cancer    Family history of colon cancer    Family history of pancreatic cancer    Fatty liver    Hx of blood clots    Hypertension    Joint pain    Joint pain    Lactose intolerance    Palpitations    Vitamin D deficiency    Past Surgical History:  Procedure Laterality Date   ABLATION     D&C Ablation of Uterus   AXILLARY SENTINEL NODE BIOPSY Left 06/17/2021   Procedure: LEFT AXILLARY SENTINEL NODE BIOPSY;  Surgeon: Jovita Kussmaul, MD;  Location: [place];  Service: General;  Laterality: Left;   BREAST BIOPSY Left 05/08/2021   BREAST LUMPECTOMY Right 10/1998   BREAST LUMPECTOMY WITH RADIOACTIVE SEED AND SENTINEL LYMPH NODE BIOPSY Left 06/17/2021   Procedure: LEFT BREAST LUMPECTOMY WITH RADIOACTIVE SEEDS x 2;  Surgeon: Jovita Kussmaul, MD;  Location: [place];  Service: General;  Laterality: Left;   BREAST REDUCTION SURGERY Bilateral 07/24/2021   Procedure: BILATERAL ONCOPLASTIC BREAST REDUCTION;  Surgeon: Wallace Going, DO;  Location: [place];  Service: Plastics;  Laterality: Bilateral;   MASTOPEXY Bilateral 07/24/2021   Procedure: MASTOPEXY;  Surgeon: Wallace Going, DO;  Location: [place];  Service: Plastics;  Laterality: Bilateral;   Patient Active Problem List   Diagnosis  Date Noted   Postoperative breast asymmetry 07/08/2021   Genetic testing 06/06/2021   Family history of breast cancer 05/24/2021   Family history of colon cancer 05/24/2021   Family history of pancreatic cancer 05/24/2021   Ductal carcinoma in situ (DCIS) of left breast 05/20/2021    PCP: Early Osmond, MD  REFERRING PROVIDER: Gardenia Phlegm, NP  REFERRING DIAG: [phone] (ICD-10-CM) - Intraductal carcinoma in situ of left breast   THERAPY DIAG:  Lymphedema, not elsewhere classified  Stiffness of left shoulder, not elsewhere classified  Abnormal posture  Intraductal carcinoma in situ of left breast  ONSET DATE: 12/22/21  Rationale for Evaluation and Treatment: Rehabilitation  SUBJECTIVE:  SUBJECTIVE STATEMENT: I am wondering if that medicine is causing pain.   PERTINENT HISTORY: Patient was diagnosed on 05/14/21 with L breast cancer. It measures 2.8 cm and is located in the upper outer quadrant. 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 06/17/21- L breast lumpectomy and SLNB 0/3, 07/24/21- bilateral oncoplastic breast reduction, Previous hx of  lumpectomy in 2000 on the R side for a nodule (not cancerous), hx of blood clot in 2021  PAIN:  Are you having pain? Yes R elbow and forearm, 7/10, tenderness, radiates to hand, unsure what helps, pulling, twisting, grabbing makes it worse  PRECAUTIONS: Other: L breast lymphedema  WEIGHT BEARING RESTRICTIONS: No  FALLS:  Has patient fallen in last 6 months? 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, full time Statistician in speech language pathology   LEISURE: pt  recently got Peloton fixed and plans to begin biking again  HAND DOMINANCE: right   PRIOR LEVEL OF FUNCTION: Independent  PATIENT GOALS: to reduce the pain and tenderness in L breast and learn how to manage lymphedema   OBJECTIVE:  COGNITION: Overall cognitive status: Within functional limits for tasks assessed   PALPATION: Thickened/fibrotic edema in L breast  OBSERVATIONS / OTHER ASSESSMENTS: peau d orange skin of L breast, L breast larger than R  POSTURE:  Forward head and rounded shoulders posture   UPPER EXTREMITY AROM/PROM:   A/PROM RIGHT  05/16/2021    Shoulder extension 76  Shoulder flexion 153  Shoulder abduction 162  Shoulder internal rotation 63  Shoulder external rotation 75                          (Blank rows = not tested)   A/PROM LEFT  05/16/2021 LEFT 02/19/22 LEFT 03/11/22  Shoulder extension 66 62   Shoulder flexion 153 140 155  Shoulder abduction 156 112 146  Shoulder internal rotation 63 55   Shoulder external rotation 74 85                           (Blank rows = not tested       UPPER EXTREMITY STRENGTH: 5/5     LYMPHEDEMA ASSESSMENTS:    LANDMARK RIGHT  05/16/2021 RIGHT 02/19/22  10 cm proximal to olecranon process 38.6 37.5  Olecranon process '31 30  10 '$ cm proximal to ulnar styloid process 27 26  Just proximal to ulnar styloid process 19.7 20  Across hand at thumb web space 22 22  At base of 2nd digit 8 7.8  (Blank rows = not tested)   LANDMARK LEFT  05/16/2021 LEFT 02/19/22  10 cm proximal to olecranon process 38 38.5  Olecranon process '31 31  10 '$ cm proximal to ulnar styloid process 26.9 25.5  Just proximal to ulnar styloid process 20.7 19.4  Across hand at thumb web space 21.5 21.7  At base of 2nd digit 7.5 7.5  (Blank rows = not tested)  LYMPHEDEMA ASSESSMENTS:   SURGERY TYPE/DATE: 06/17/21- L br lumpectomy and SLNB; 07/24/21 - bilateral oncoplastic breast reduction; hx of R lumpectomy in 2000 - non cancerous  NUMBER OF LYMPH  NODES REMOVED: 0/3  CHEMOTHERAPY: none  RADIATION:complted on 10/16/21  HORMONE TREATMENT: began 10/2021- on Tamoxifen  INFECTIONS: none  FUNCTIONAL TESTS:  Breast Complaints Scale-  46     L-DEX LYMPHEDEMA SCREENING:  The patient was assessed using the L-Dex machine today to produce a lymphedema index baseline score. The patient will be reassessed on a regular basis (typically every 3 months) to obtain new L-Dex scores. If the score is > 6.5 points away from his/her baseline score indicating onset of subclinical lymphedema, it will be recommended to wear a compression garment for 4 weeks, 12 hours per day and then be reassessed. If the score continues to be > 6.5 points from baseline at reassessment, we will initiate lymphedema treatment. Assessing in this manner has a 95% rate of preventing clinically significant lymphedema.     TODAY'S TREATMENT:                                                                                                                                         DATE:  03/26/22: Manual Therapy Reassessed goals for renewal In supine: Short neck, 5 diaphragmatic breaths, Rt axillary nodes and establishment of anterior inter-axillary pathway, L tinguinal nodes and establishment of Lt axillo-inguinal pathway, then Lt breast moving fluid towards pathways, then briefly into Rt S/L to teach pt how to better reach lateral aspect of breast and Lt axillo-inguinal anastomosis; then finished retracing all steps in supine spending extra time in any areas of fibrosis reviewing with pt throughout correct pressure and to only stretch skin, not slide, with hand over hand pressure.  P/ROM to Lt shoulder into flexion and abd briefly with scapular depression. STM to Lt axilla over scar tissue and where pt reports tenderness to touch that improved  03/13/22: In supine: Short neck, 5 diaphragmatic breaths, R axillary nodes and establishment of interaxillary pathway, L inguinal nodes and  establishment of axilloinguinal pathway, then L breast moving fluid towards pathways spending extra time in any areas of fibrosis then retracing all steps.   03/11/22: In supine: Short neck, 5 diaphragmatic breaths, R axillary nodes and establishment of interaxillary pathway, L inguinal nodes and establishment of axilloinguinal pathway, then L breast moving fluid towards pathways spending extra time in any areas of fibrosis then retracing all steps. Soft tissue mobilization using cocoa butter to scar tissue and muscle tightness in L axilla and area inferior to L axilla with numerous areas of tightness and tenderness noted.     PATIENT EDUCATION:  Education details: anatomy and physiology of the lymphatic system, need for compression bra, chip pack, basic principles of MLD Person educated: Patient Education method: Explanation Education comprehension: verbalized understanding  HOME EXERCISE PROGRAM: Wear compression bra as much as possible  ASSESSMENT:  CLINICAL IMPRESSION: Renewal done this session. Pt has made excellent progress towards all goals. She will benefit from continuing physical therapy at this time to continue progress towards goal of independence with self MLD and to further her HEP. We will be able to focus more on this  now that pt is becoming more independent with HEP. Continued with MLD today as time allowed as pt arrived late due to her childs dental appt this morning. She benefited from further review of correct pressure and to stretch skin, not slide. Also briefly demonstrated how pt can be performing end ROM stretching in her doorway for Lt shoulder for now. We will progress this further at next session.   OBJECTIVE IMPAIRMENTS: decreased knowledge of condition, decreased knowledge of use of DME, decreased ROM, decreased strength, increased edema, increased fascial restrictions, impaired UE functional use, postural dysfunction, and pain.   ACTIVITY LIMITATIONS: sleeping and  reach over head  PARTICIPATION LIMITATIONS:  none  PERSONAL FACTORS: Time since onset of injury/illness/exacerbation are also affecting patient's functional outcome.   REHAB POTENTIAL: Good  CLINICAL DECISION MAKING: Stable/uncomplicated  EVALUATION COMPLEXITY: Low  GOALS: Goals reviewed with patient? Yes  SHORT TERM GOALS=LONG TERM GOALS Target date: 03/19/22  Pt will be independent in self MLD for long term management of lymphedema.  Baseline: no knowledge; Pt reports her and her husband are managing but will benefit from further review - 03/26/22 Goal status: Progress towards goal  2.  Pt will demonstrate decreased pore size and improved skin mobility in L breast to allow improved comfort.  Baseline: Pores are still present though are less noticeable, and skin is more pliable - 03/26/22 Goal status: Progress towards goal  3.  Pt will demonstrate 150 degrees of L shoulder abduction to allow her to reach out to the side.  Baseline: 112; 142 degrees - 03/26/22 Goal status:Progress towards goal  4.  Pt will be independent in a home exericse program for continued stretching and strengthening.  Baseline: Pt is working towards independence with initial HEP, we will be switching focus  Goal status: Progress towards goals   PLAN:  PT FREQUENCY: 2x/week  PT DURATION: 4 weeks  PLANNED INTERVENTIONS: Therapeutic exercises, Therapeutic activity, Patient/Family education, Self Care, Joint mobilization, Orthotic/Fit training, Manual lymph drainage, Compression bandaging, scar mobilization, Taping, Vasopneumatic device, Manual therapy, and Re-evaluation  PLAN FOR NEXT SESSION: Renewal done this session to cont 2x/wk for another 4 wks. STM to lateral trunk, cont and review MLD to L breast, how is compression bra? How was chip pack? ROM to L shoulder both PROM and AAROM, give HEP for this and progress prn, compression pump?    Otelia Limes, PTA 03/26/2022, 1:13 PM

## 2022-03-28 ENCOUNTER — Telehealth: Payer: Self-pay | Admitting: *Deleted

## 2022-03-28 ENCOUNTER — Encounter: Payer: Self-pay | Admitting: Physical Therapy

## 2022-03-28 ENCOUNTER — Ambulatory Visit: Payer: BC Managed Care – PPO | Admitting: Physical Therapy

## 2022-03-28 DIAGNOSIS — D0512 Intraductal carcinoma in situ of left breast: Secondary | ICD-10-CM

## 2022-03-28 DIAGNOSIS — M25612 Stiffness of left shoulder, not elsewhere classified: Secondary | ICD-10-CM

## 2022-03-28 DIAGNOSIS — R293 Abnormal posture: Secondary | ICD-10-CM

## 2022-03-28 DIAGNOSIS — I890 Lymphedema, not elsewhere classified: Secondary | ICD-10-CM | POA: Diagnosis not present

## 2022-03-28 NOTE — Therapy (Signed)
OUTPATIENT PHYSICAL THERAPY ONCOLOGY TREATMENT  Patient Name: Joan Dalton MRN: [id number] DOB:[birth year], 47 y.o., female Today's Date: 03/28/2022  END OF SESSION:  PT End of Session - 03/28/22 0911     Visit Number 9    Number of Visits 17    Date for PT Re-Evaluation 04/23/22    PT Start Time 0910    PT Stop Time 0956    PT Time Calculation (min) 46 min    Activity Tolerance Patient tolerated treatment well    Behavior During Therapy [facility] for tasks assessed/performed             Past Medical History:  Diagnosis Date   Anemia    Anxiety    Bilateral swelling of feet    Breast cancer ([place]) 05/08/2021   Constipation    Family history of breast cancer    Family history of colon cancer    Family history of pancreatic cancer    Fatty liver    Hx of blood clots    Hypertension    Joint pain    Joint pain    Lactose intolerance    Palpitations    Vitamin D deficiency    Past Surgical History:  Procedure Laterality Date   ABLATION     D&C Ablation of Uterus   AXILLARY SENTINEL NODE BIOPSY Left 06/17/2021   Procedure: LEFT AXILLARY SENTINEL NODE BIOPSY;  Surgeon: Jovita Kussmaul, MD;  Location: [place];  Service: General;  Laterality: Left;   BREAST BIOPSY Left 05/08/2021   BREAST LUMPECTOMY Right 10/1998   BREAST LUMPECTOMY WITH RADIOACTIVE SEED AND SENTINEL LYMPH NODE BIOPSY Left 06/17/2021   Procedure: LEFT BREAST LUMPECTOMY WITH RADIOACTIVE SEEDS x 2;  Surgeon: Jovita Kussmaul, MD;  Location: [place];  Service: General;  Laterality: Left;   BREAST REDUCTION SURGERY Bilateral 07/24/2021   Procedure: BILATERAL ONCOPLASTIC BREAST REDUCTION;  Surgeon: Wallace Going, DO;  Location: [place];  Service: Plastics;  Laterality: Bilateral;   MASTOPEXY Bilateral 07/24/2021   Procedure: MASTOPEXY;  Surgeon: Wallace Going, DO;  Location: [place];  Service: Plastics;  Laterality: Bilateral;   Patient Active Problem List   Diagnosis Date Noted   Postoperative breast  asymmetry 07/08/2021   Genetic testing 06/06/2021   Family history of breast cancer 05/24/2021   Family history of colon cancer 05/24/2021   Family history of pancreatic cancer 05/24/2021   Ductal carcinoma in situ (DCIS) of left breast 05/20/2021    PCP: Early Osmond, MD  REFERRING PROVIDER: Gardenia Phlegm, NP  REFERRING DIAG: [phone] (ICD-10-CM) - Intraductal carcinoma in situ of left breast   THERAPY DIAG:  Lymphedema, not elsewhere classified  Stiffness of left shoulder, not elsewhere classified  Abnormal posture  Intraductal carcinoma in situ of left breast  ONSET DATE: 12/22/21  Rationale for Evaluation and Treatment: Rehabilitation  SUBJECTIVE:  SUBJECTIVE STATEMENT: I am have been doing the massage every other day.  PERTINENT HISTORY: Patient was diagnosed on 05/14/21 with L breast cancer. It measures 2.8 cm and is located in the upper outer quadrant. 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 06/17/21- L breast lumpectomy and SLNB 0/3, 07/24/21- bilateral oncoplastic breast reduction, Previous hx of  lumpectomy in 2000 on the R side for a nodule (not cancerous), hx of blood clot in 2021  PAIN:  Are you having pain? No  PRECAUTIONS: Other: L breast lymphedema  WEIGHT BEARING RESTRICTIONS: No  FALLS:  Has patient fallen in last 6 months? 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, full time Statistician in speech language pathology   LEISURE: pt recently got Peloton fixed and plans to begin biking again  HAND DOMINANCE: right   PRIOR LEVEL OF FUNCTION: Independent  PATIENT GOALS: to reduce the  pain and tenderness in L breast and learn how to manage lymphedema   OBJECTIVE:  COGNITION: Overall cognitive status: Within functional limits for tasks assessed   PALPATION: Thickened/fibrotic edema in L breast  OBSERVATIONS / OTHER ASSESSMENTS: peau d orange skin of L breast, L breast larger than R  POSTURE:  Forward head and rounded shoulders posture   UPPER EXTREMITY AROM/PROM:   A/PROM RIGHT  05/16/2021    Shoulder extension 76  Shoulder flexion 153  Shoulder abduction 162  Shoulder internal rotation 63  Shoulder external rotation 75                          (Blank rows = not tested)   A/PROM LEFT  05/16/2021 LEFT 02/19/22 LEFT 03/11/22  Shoulder extension 66 62   Shoulder flexion 153 140 155  Shoulder abduction 156 112 146  Shoulder internal rotation 63 55   Shoulder external rotation 74 85                           (Blank rows = not tested       UPPER EXTREMITY STRENGTH: 5/5     LYMPHEDEMA ASSESSMENTS:    LANDMARK RIGHT  05/16/2021 RIGHT 02/19/22  10 cm proximal to olecranon process 38.6 37.5  Olecranon process '31 30  10 '$ cm proximal to ulnar styloid process 27 26  Just proximal to ulnar styloid process 19.7 20  Across hand at thumb web space 22 22  At base of 2nd digit 8 7.8  (Blank rows = not tested)   LANDMARK LEFT  05/16/2021 LEFT 02/19/22  10 cm proximal to olecranon process 38 38.5  Olecranon process '31 31  10 '$ cm proximal to ulnar styloid process 26.9 25.5  Just proximal to ulnar styloid process 20.7 19.4  Across hand at thumb web space 21.5 21.7  At base of 2nd digit 7.5 7.5  (Blank rows = not tested)  LYMPHEDEMA ASSESSMENTS:   SURGERY TYPE/DATE: 06/17/21- L br lumpectomy and SLNB; 07/24/21 - bilateral oncoplastic breast reduction; hx of R lumpectomy in 2000 - non cancerous  NUMBER OF LYMPH NODES REMOVED: 0/3  CHEMOTHERAPY: none  RADIATION:complted on 10/16/21  HORMONE TREATMENT: began 10/2021- on Tamoxifen  INFECTIONS: none   FUNCTIONAL  TESTS:  Breast Complaints Scale-  46     L-DEX LYMPHEDEMA SCREENING:  The patient  was assessed using the L-Dex machine today to produce a lymphedema index baseline score. The patient will be reassessed on a regular basis (typically every 3 months) to obtain new L-Dex scores. If the score is > 6.5 points away from his/her baseline score indicating onset of subclinical lymphedema, it will be recommended to wear a compression garment for 4 weeks, 12 hours per day and then be reassessed. If the score continues to be > 6.5 points from baseline at reassessment, we will initiate lymphedema treatment. Assessing in this manner has a 95% rate of preventing clinically significant lymphedema.     TODAY'S TREATMENT:                                                                                                                                         DATE:  03/28/22: In supine: Short neck, 5 diaphragmatic breaths, R axillary nodes and establishment of interaxillary pathway, L inguinal nodes and establishment of axilloinguinal pathway, then L breast moving fluid towards pathways spending extra time in any areas of fibrosis then retracing all steps.   03/26/22: Manual Therapy Reassessed goals for renewal In supine: Short neck, 5 diaphragmatic breaths, Rt axillary nodes and establishment of anterior inter-axillary pathway, L tinguinal nodes and establishment of Lt axillo-inguinal pathway, then Lt breast moving fluid towards pathways, then briefly into Rt S/L to teach pt how to better reach lateral aspect of breast and Lt axillo-inguinal anastomosis; then finished retracing all steps in supine spending extra time in any areas of fibrosis reviewing with pt throughout correct pressure and to only stretch skin, not slide, with hand over hand pressure.  P/ROM to Lt shoulder into flexion and abd briefly with scapular depression. STM to Lt axilla over scar tissue and where pt reports tenderness to touch that  improved  03/13/22: In supine: Short neck, 5 diaphragmatic breaths, R axillary nodes and establishment of interaxillary pathway, L inguinal nodes and establishment of axilloinguinal pathway, then L breast moving fluid towards pathways spending extra time in any areas of fibrosis then retracing all steps.   03/11/22: In supine: Short neck, 5 diaphragmatic breaths, R axillary nodes and establishment of interaxillary pathway, L inguinal nodes and establishment of axilloinguinal pathway, then L breast moving fluid towards pathways spending extra time in any areas of fibrosis then retracing all steps. Soft tissue mobilization using cocoa butter to scar tissue and muscle tightness in L axilla and area inferior to L axilla with numerous areas of tightness and tenderness noted.     PATIENT EDUCATION:  Education details: anatomy and physiology of the lymphatic system, need for compression bra, chip pack, basic principles of MLD Person educated: Patient Education method: Explanation Education comprehension: verbalized understanding  HOME EXERCISE PROGRAM: Wear compression bra as much as possible  ASSESSMENT:  CLINICAL IMPRESSION: Continued with MLD to L breast. She has been having increased discomfort at left lateral trunk and upper abdomen.  There is a difference in tissue texture in this region. It is more firm with irregular edges. There is also a similar texture on the right side but the left is more firm and more tender. Educated pt to follow up with her oncologist regarding this. Will create a new HEP for L shoulder ROM and strengthening at next session.   OBJECTIVE IMPAIRMENTS: decreased knowledge of condition, decreased knowledge of use of DME, decreased ROM, decreased strength, increased edema, increased fascial restrictions, impaired UE functional use, postural dysfunction, and pain.   ACTIVITY LIMITATIONS: sleeping and reach over head  PARTICIPATION LIMITATIONS:  none  PERSONAL FACTORS:  Time since onset of injury/illness/exacerbation are also affecting patient's functional outcome.   REHAB POTENTIAL: Good  CLINICAL DECISION MAKING: Stable/uncomplicated  EVALUATION COMPLEXITY: Low  GOALS: Goals reviewed with patient? Yes  SHORT TERM GOALS=LONG TERM GOALS Target date: 03/19/22  Pt will be independent in self MLD for long term management of lymphedema.  Baseline: no knowledge; Pt reports her and her husband are managing but will benefit from further review - 03/26/22 Goal status: Progress towards goal  2.  Pt will demonstrate decreased pore size and improved skin mobility in L breast to allow improved comfort.  Baseline: Pores are still present though are less noticeable, and skin is more pliable - 03/26/22 Goal status: Progress towards goal  3.  Pt will demonstrate 150 degrees of L shoulder abduction to allow her to reach out to the side.  Baseline: 112; 142 degrees - 03/26/22 Goal status:Progress towards goal  4.  Pt will be independent in a home exericse program for continued stretching and strengthening.  Baseline: Pt is working towards independence with initial HEP, we will be switching focus  Goal status: Progress towards goals   PLAN:  PT FREQUENCY: 2x/week  PT DURATION: 4 weeks  PLANNED INTERVENTIONS: Therapeutic exercises, Therapeutic activity, Patient/Family education, Self Care, Joint mobilization, Orthotic/Fit training, Manual lymph drainage, Compression bandaging, scar mobilization, Taping, Vasopneumatic device, Manual therapy, and Re-evaluation  PLAN FOR NEXT SESSION: give HEP for UE stretches and band exercises. STM to lateral trunk, cont and review MLD to L breast, how is compression bra? How was chip pack? ROM to L shoulder both PROM and AAROM, give HEP for this and progress prn, compression pump?    [employer], PT 03/28/2022, 9:59 AM

## 2022-03-28 NOTE — Telephone Encounter (Signed)
Pt called and left voice message stating that the physical therapist discovered multiple nodules that needed to be evaluated. Pt was called and voice message was left for [facility] appt on 1/8 at 12:15 pm. Advised to call office if she would like to reschedule.

## 2022-03-31 ENCOUNTER — Encounter: Payer: Self-pay | Admitting: Adult Health

## 2022-03-31 ENCOUNTER — Telehealth: Payer: Self-pay

## 2022-03-31 ENCOUNTER — Ambulatory Visit: Payer: BC Managed Care – PPO | Admitting: Physical Therapy

## 2022-03-31 ENCOUNTER — Inpatient Hospital Stay: Payer: BC Managed Care – PPO | Attending: Genetic Counselor | Admitting: Adult Health

## 2022-03-31 ENCOUNTER — Encounter: Payer: Self-pay | Admitting: Physical Therapy

## 2022-03-31 ENCOUNTER — Other Ambulatory Visit: Payer: Self-pay

## 2022-03-31 VITALS — BP 148/101 | HR 105 | Resp 18 | Wt 276.3 lb

## 2022-03-31 DIAGNOSIS — Z923 Personal history of irradiation: Secondary | ICD-10-CM | POA: Insufficient documentation

## 2022-03-31 DIAGNOSIS — Z79810 Long term (current) use of selective estrogen receptor modulators (SERMs): Secondary | ICD-10-CM | POA: Insufficient documentation

## 2022-03-31 DIAGNOSIS — I890 Lymphedema, not elsewhere classified: Secondary | ICD-10-CM

## 2022-03-31 DIAGNOSIS — R229 Localized swelling, mass and lump, unspecified: Secondary | ICD-10-CM | POA: Diagnosis not present

## 2022-03-31 DIAGNOSIS — D0512 Intraductal carcinoma in situ of left breast: Secondary | ICD-10-CM

## 2022-03-31 DIAGNOSIS — R293 Abnormal posture: Secondary | ICD-10-CM

## 2022-03-31 DIAGNOSIS — Z170 Estrogen receptor positive status [ER+]: Secondary | ICD-10-CM | POA: Insufficient documentation

## 2022-03-31 DIAGNOSIS — R102 Pelvic and perineal pain: Secondary | ICD-10-CM

## 2022-03-31 DIAGNOSIS — D0511 Intraductal carcinoma in situ of right breast: Secondary | ICD-10-CM | POA: Diagnosis present

## 2022-03-31 DIAGNOSIS — M25612 Stiffness of left shoulder, not elsewhere classified: Secondary | ICD-10-CM

## 2022-03-31 MED ORDER — TAMOXIFEN CITRATE 20 MG PO TABS: 20.0000 mg | ORAL_TABLET | Freq: Every day | ORAL | 1 refills | Status: DC

## 2022-03-31 NOTE — Addendum Note (Signed)
Addended by: Shan Levans R on: 03/31/2022 11:21 AM   Modules accepted: Orders

## 2022-03-31 NOTE — Therapy (Addendum)
OUTPATIENT PHYSICAL THERAPY ONCOLOGY TREATMENT  Patient Name: Joan Dalton MRN: [id number] DOB:03/07/1976, 47 y.o., female Today's Date: 03/31/2022  END OF SESSION:  PT End of Session - 03/31/22 1006     Visit Number 10    Number of Visits 17    Date for PT Re-Evaluation 04/23/22    PT Start Time 1004    PT Stop Time 1116    PT Time Calculation (min) 72 min    Activity Tolerance Patient tolerated treatment well    Behavior During Therapy [facility] for tasks assessed/performed             Past Medical History:  Diagnosis Date   Anemia    Anxiety    Bilateral swelling of feet    Breast cancer ([place]) 05/08/2021   Constipation    Family history of breast cancer    Family history of colon cancer    Family history of pancreatic cancer    Fatty liver    Hx of blood clots    Hypertension    Joint pain    Joint pain    Lactose intolerance    Palpitations    Vitamin D deficiency    Past Surgical History:  Procedure Laterality Date   ABLATION     D&C Ablation of Uterus   AXILLARY SENTINEL NODE BIOPSY Left 06/17/2021   Procedure: LEFT AXILLARY SENTINEL NODE BIOPSY;  Surgeon: Jovita Kussmaul, MD;  Location: [place];  Service: General;  Laterality: Left;   BREAST BIOPSY Left 05/08/2021   BREAST LUMPECTOMY Right 10/1998   BREAST LUMPECTOMY WITH RADIOACTIVE SEED AND SENTINEL LYMPH NODE BIOPSY Left 06/17/2021   Procedure: LEFT BREAST LUMPECTOMY WITH RADIOACTIVE SEEDS x 2;  Surgeon: Jovita Kussmaul, MD;  Location: [place];  Service: General;  Laterality: Left;   BREAST REDUCTION SURGERY Bilateral 07/24/2021   Procedure: BILATERAL ONCOPLASTIC BREAST REDUCTION;  Surgeon: Wallace Going, DO;  Location: [place];  Service: Plastics;  Laterality: Bilateral;   MASTOPEXY Bilateral 07/24/2021   Procedure: MASTOPEXY;  Surgeon: Wallace Going, DO;  Location: [place];  Service: Plastics;  Laterality: Bilateral;   Patient Active Problem List   Diagnosis Date Noted   Postoperative breast  asymmetry 07/08/2021   Genetic testing 06/06/2021   Family history of breast cancer 05/24/2021   Family history of colon cancer 05/24/2021   Family history of pancreatic cancer 05/24/2021   Ductal carcinoma in situ (DCIS) of left breast 05/20/2021    PCP: Early Osmond, MD  REFERRING PROVIDER: Gardenia Phlegm, NP  REFERRING DIAG: [phone] (ICD-10-CM) - Intraductal carcinoma in situ of left breast   THERAPY DIAG:  Lymphedema, not elsewhere classified  Stiffness of left shoulder, not elsewhere classified  Abnormal posture  Intraductal carcinoma in situ of left breast  ONSET DATE: 12/22/21  Rationale for Evaluation and Treatment: Rehabilitation  SUBJECTIVE:  SUBJECTIVE STATEMENT: I have an appointment this afternoon to have that area looked at.   PERTINENT HISTORY: Patient was diagnosed on 05/14/21 with L breast cancer. It measures 2.8 cm and is located in the upper outer quadrant. 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 06/17/21- L breast lumpectomy and SLNB 0/3, 07/24/21- bilateral oncoplastic breast reduction, Previous hx of  lumpectomy in 2000 on the R side for a nodule (not cancerous), hx of blood clot in 2021  PAIN:  Are you having pain? No  PRECAUTIONS: Other: L breast lymphedema  WEIGHT BEARING RESTRICTIONS: No  FALLS:  Has patient fallen in last 6 months? No  LIVING ENVIRONMENT: Lives with: lives with their family and lives with their spouse 5 children aged 7, 22, 67, 62, 6 Lives in: House/apartment Has following equipment at home:  None  OCCUPATION:  part time- grad Naval architect, full time Statistician in speech language pathology   LEISURE: pt recently got [employer] fixed and plans to begin biking again  HAND DOMINANCE: right   PRIOR LEVEL OF FUNCTION: Independent  PATIENT  GOALS: to reduce the pain and tenderness in L breast and learn how to manage lymphedema   OBJECTIVE:  COGNITION: Overall cognitive status: Within functional limits for tasks assessed   PALPATION: Thickened/fibrotic edema in L breast  OBSERVATIONS / OTHER ASSESSMENTS: peau d orange skin of L breast, L breast larger than R  POSTURE:  Forward head and rounded shoulders posture   UPPER EXTREMITY AROM/PROM:   A/PROM RIGHT  05/16/2021    Shoulder extension 76  Shoulder flexion 153  Shoulder abduction 162  Shoulder internal rotation 63  Shoulder external rotation 75                          (Blank rows = not tested)   A/PROM LEFT  05/16/2021 LEFT 02/19/22 LEFT 03/11/22  Shoulder extension 66 62   Shoulder flexion 153 140 155  Shoulder abduction 156 112 146  Shoulder internal rotation 63 55   Shoulder external rotation 74 85                           (Blank rows = not tested       UPPER EXTREMITY STRENGTH: 5/5     LYMPHEDEMA ASSESSMENTS:    LANDMARK RIGHT  05/16/2021 RIGHT 02/19/22  10 cm proximal to olecranon process 38.6 37.5  Olecranon process '31 30  10 '$ cm proximal to ulnar styloid process 27 26  Just proximal to ulnar styloid process 19.7 20  Across hand at thumb web space 22 22  At base of 2nd digit 8 7.8  (Blank rows = not tested)   LANDMARK LEFT  05/16/2021 LEFT 02/19/22  10 cm proximal to olecranon process 38 38.5  Olecranon process '31 31  10 '$ cm proximal to ulnar styloid process 26.9 25.5  Just proximal to ulnar styloid process 20.7 19.4  Across hand at thumb web space 21.5 21.7  At base of 2nd digit 7.5 7.5  (Blank rows = not tested)  LYMPHEDEMA ASSESSMENTS:   SURGERY TYPE/DATE: 06/17/21- L br lumpectomy and SLNB; 07/24/21 - bilateral oncoplastic breast reduction; hx of R lumpectomy in 2000 - non cancerous  NUMBER OF LYMPH NODES REMOVED: 0/3  CHEMOTHERAPY: none  RADIATION:complted on 10/16/21  HORMONE TREATMENT: began 10/2021- on Tamoxifen  INFECTIONS:  none   FUNCTIONAL TESTS:  Breast Complaints Scale-  46     L-DEX LYMPHEDEMA SCREENING:  The patient was assessed using the L-Dex machine today to produce a lymphedema index baseline score. The patient will be reassessed on a regular basis (typically every 3 months) to obtain new L-Dex scores. If the score is > 6.5 points away from his/her baseline score indicating onset of subclinical lymphedema, it will be recommended to wear a compression garment for 4 weeks, 12 hours per day and then be reassessed. If the score continues to be > 6.5 points from baseline at reassessment, we will initiate lymphedema treatment. Assessing in this manner has a 95% rate of preventing clinically significant lymphedema.     TODAY'S TREATMENT:                                                                                                                                         DATE:  03/31/22: In supine: Short neck, 5 diaphragmatic breaths, R axillary nodes and establishment of interaxillary pathway, L inguinal nodes and establishment of axilloinguinal pathway, then L breast moving fluid towards pathways spending extra time in any areas of fibrosis then retracing all steps.  PROM to L shoulder with v/c to keep shoulder relaxed with pt feeling increased tightness in L axilla Instructed pt in supine scapular strengthening exercises with yellow band x 10 reps each with pt returning therapist demo, therapist gave v/c and t/c for overhead flexion narrow and wide grip, diagonals, ER and horizontal abduction  03/28/22: In supine: Short neck, 5 diaphragmatic breaths, R axillary nodes and establishment of interaxillary pathway, L inguinal nodes and establishment of axilloinguinal pathway, then L breast moving fluid towards pathways spending extra time in any areas of fibrosis then retracing all steps.   03/26/22: Manual Therapy Reassessed goals for renewal In supine: Short neck, 5 diaphragmatic breaths, Rt axillary nodes and  establishment of anterior inter-axillary pathway, L tinguinal nodes and establishment of Lt axillo-inguinal pathway, then Lt breast moving fluid towards pathways, then briefly into Rt S/L to teach pt how to better reach lateral aspect of breast and Lt axillo-inguinal anastomosis; then finished retracing all steps in supine spending extra time in any areas of fibrosis reviewing with pt throughout correct pressure and to only stretch skin, not slide, with hand over hand pressure.  P/ROM to Lt shoulder into flexion and abd briefly with scapular depression. STM to Lt axilla over scar tissue and where pt reports tenderness to touch that improved  03/13/22: In supine: Short neck, 5 diaphragmatic breaths, R axillary nodes and establishment of interaxillary pathway, L inguinal nodes and establishment of axilloinguinal pathway, then L breast moving fluid towards pathways spending extra time in any areas of fibrosis then retracing all steps.   03/11/22: In supine: Short neck, 5 diaphragmatic breaths, R axillary nodes and establishment of interaxillary pathway, L inguinal nodes and establishment of axilloinguinal pathway, then L breast moving fluid towards pathways spending extra time in any areas of fibrosis then retracing all  steps. Soft tissue mobilization using cocoa butter to scar tissue and muscle tightness in L axilla and area inferior to L axilla with numerous areas of tightness and tenderness noted.     PATIENT EDUCATION:  Education details: anatomy and physiology of the lymphatic system, need for compression bra, chip pack, basic principles of MLD Person educated: Patient Education method: Explanation Education comprehension: verbalized understanding  HOME EXERCISE PROGRAM: Wear compression bra as much as possible  ASSESSMENT:  CLINICAL IMPRESSION: Continued with MLD to L breast. Her fibrosis continues to soften. Worked on PROM to L shoulder today because her abduction is still limited and  tight. She feels a strong stretch in her axilla with L shoulder PROM. Encouraged pt to stretch at home.   OBJECTIVE IMPAIRMENTS: decreased knowledge of condition, decreased knowledge of use of DME, decreased ROM, decreased strength, increased edema, increased fascial restrictions, impaired UE functional use, postural dysfunction, and pain.   ACTIVITY LIMITATIONS: sleeping and reach over head  PARTICIPATION LIMITATIONS:  none  PERSONAL FACTORS: Time since onset of injury/illness/exacerbation are also affecting patient's functional outcome.   REHAB POTENTIAL: Good  CLINICAL DECISION MAKING: Stable/uncomplicated  EVALUATION COMPLEXITY: Low  GOALS: Goals reviewed with patient? Yes  SHORT TERM GOALS=LONG TERM GOALS Target date: 03/19/22  Pt will be independent in self MLD for long term management of lymphedema.  Baseline: no knowledge; Pt reports her and her husband are managing but will benefit from further review - 03/26/22 Goal status: Progress towards goal  2.  Pt will demonstrate decreased pore size and improved skin mobility in L breast to allow improved comfort.  Baseline: Pores are still present though are less noticeable, and skin is more pliable - 03/26/22 Goal status: Progress towards goal  3.  Pt will demonstrate 150 degrees of L shoulder abduction to allow her to reach out to the side.  Baseline: 112; 142 degrees - 03/26/22 Goal status:Progress towards goal  4.  Pt will be independent in a home exericse program for continued stretching and strengthening.  Baseline: Pt is working towards independence with initial HEP, we will be switching focus  Goal status: Progress towards goals   PLAN:  PT FREQUENCY: 2x/week  PT DURATION: 4 weeks  PLANNED INTERVENTIONS: Therapeutic exercises, Therapeutic activity, Patient/Family education, Self Care, Joint mobilization, Orthotic/Fit training, Manual lymph drainage, Compression bandaging, scar mobilization, Taping, Vasopneumatic device,  Manual therapy, and Re-evaluation  PLAN FOR NEXT SESSION: give HEP for UE stretches, how were band exercises, how is snow angel stretch - add to HEP,  STM to lateral trunk, cont and review MLD to L breast, how is compression bra? How was chip pack? ROM to L shoulder both PROM and AAROM, give HEP for this and progress prn, compression pump?    [employer], PT 03/31/2022, 11:19 AM

## 2022-03-31 NOTE — Patient Instructions (Signed)
Over Head Pull: Narrow and Wide Grip   Cancer Rehab [phone]   On back, knees bent, feet flat, band across thighs, elbows straight but relaxed. Pull hands apart (start). Keeping elbows straight, bring arms up and over head, hands toward floor. Keep pull steady on band. Hold momentarily. Return slowly, keeping pull steady, back to start. Then do same with a wider grip on the band (past shoulder width) Repeat _5-10__ times. Band color __yellow____   Side Pull: Double Arm   On back, knees bent, feet flat. Arms perpendicular to body, shoulder level, elbows straight but relaxed. Pull arms out to sides, elbows straight. Resistance band comes across collarbones, hands toward floor. Hold momentarily. Slowly return to starting position. Repeat _5-10__ times. Band color _yellow____   Sword   On back, knees bent, feet flat, left hand on left hip, right hand above left. Pull right arm DIAGONALLY (hip to shoulder) across chest. Bring right arm along head toward floor. Hold momentarily. Thumb is pointed down towards pocket when by hip and rotates backwards towards floor when by head. Slowly return to starting position. Repeat _5-10__ times. Do with left arm. Band color _yellow_____   Shoulder Rotation: Double Arm   On back, knees bent, feet flat, elbows tucked at sides, bent 90, hands palms up. Pull hands apart and down toward floor, keeping elbows near sides. Hold momentarily. Slowly return to starting position. Repeat _5-10__ times. Band color __yellow____

## 2022-03-31 NOTE — Telephone Encounter (Signed)
Placed call to Jasper Riling, mammo supervisor at [facility] to inquire about orders being placed correctly. Pt was to have mammo scheduled by 03/24/22 that has not been scheduled yet. We would also like to order an [place], but want to know if they want a whole new diag mammo and ultrasound order or just the ultrasound order. LVM for Crissie to return my call. NP is aware.

## 2022-03-31 NOTE — Progress Notes (Incomplete)
Joan Dalton Follow up:    [name], [name], MD [address] [place] [address] [address]   DIAGNOSIS: Dalton Staging  Ductal carcinoma in situ (DCIS) of left breast Staging form: Breast, AJCC 8th Edition - Clinical stage from 06/17/2021: Stage 0 (cTis (DCIS), cN0, cM0, ER+, PR+) - Signed by Joan Phlegm, NP on 01/30/2022   SUMMARY OF ONCOLOGIC HISTORY: Oncology History  Ductal carcinoma in situ (DCIS) of left breast  05/20/2021 Initial Diagnosis   Ductal carcinoma in situ (DCIS) of left breast   06/05/2021 Genetic Testing   Negative genetic testing on the CancerNext-Expanded+RNAinsight panel.  The report date is June 05, 2021.  The CancerNext-Expanded gene panel offered by [facility] and includes sequencing and rearrangement analysis for the following 77 genes: AIP, ALK, APC*, ATM*, AXIN2, BAP1, BARD1, BLM, BMPR1A, BRCA1*, BRCA2*, BRIP1*, CDC73, CDH1*, CDK4, CDKN1B, CDKN2A, CHEK2*, CTNNA1, DICER1, FANCC, FH, FLCN, GALNT12, KIF1B, LZTR1, MAX, MEN1, MET, MLH1*, MSH2*, MSH3, MSH6*, MUTYH*, NBN, NF1*, NF2, NTHL1, PALB2*, PHOX2B, PMS2*, POT1, PRKAR1A, PTCH1, PTEN*, RAD51C*, RAD51D*, RB1, RECQL, RET, SDHA, SDHAF2, SDHB, SDHC, SDHD, SMAD4, SMARCA4, SMARCB1, SMARCE1, STK11, SUFU, TMEM127, TP53*, TSC1, TSC2, VHL and XRCC2 (sequencing and deletion/duplication); EGFR, EGLN1, HOXB13, KIT, MITF, PDGFRA, POLD1, and POLE (sequencing only); EPCAM and GREM1 (deletion/duplication only). DNA and RNA analyses performed for * genes.    06/17/2021 Surgery   Patient had left breast lumpectomy which showed high-grade ductal carcinoma in situ with necrosis and calcifications, resection margins are negative for carcinoma left additional superior margin excision again negative for carcinoma.  She had 3 out of 3 lymph nodes without involvement.  Prior prognostic showed ER 85% positive strong staining PR 95% positive strong staining. She is now undergoing plastic surgery, had  right and left breast mammoplasty with no evidence of malignancy.   06/17/2021 Dalton Staging   Staging form: Breast, AJCC 8th Edition - Clinical stage from 06/17/2021: Stage 0 (cTis (DCIS), cN0, cM0, ER+, PR+) - Signed by Joan Phlegm, NP on 01/30/2022   08/28/2021 - 10/16/2021 Radiation Therapy   Site Technique Total Dose (Gy) Dose per Fx (Gy) Completed Fx Beam Energies  Breast, Left: Breast_L 3D 50.4/50.4 1.8 28/28 10XFFF  Breast, Left: Breast_L_Bst 3D 10/10 2 5/5 6X, 10X     10/2021 -  Anti-estrogen oral therapy   Tamoxifen     CURRENT THERAPY: Tamoxifen  INTERVAL HISTORY: Joan Dalton 47 y.o. female returns for urgent evaluation of a lump in her left flank area.  She was sent here by physical therapy for evaluation of the small nodules when she saw them.   Patient Active Problem List   Diagnosis Date Noted   Postoperative breast asymmetry 07/08/2021   Genetic testing 06/06/2021   Family history of breast Dalton 05/24/2021   Family history of colon Dalton 05/24/2021   Family history of pancreatic Dalton 05/24/2021   Ductal carcinoma in situ (DCIS) of left breast 05/20/2021    is allergic to sulfamethoxazole-trimethoprim.  MEDICAL HISTORY: Past Medical History:  Diagnosis Date   Anemia    Anxiety    Bilateral swelling of feet    Breast Dalton ([place]) 05/08/2021   Constipation    Family history of breast Dalton    Family history of colon Dalton    Family history of pancreatic Dalton    Fatty liver    Hx of blood clots    Hypertension    Joint pain    Joint pain    Lactose  intolerance    Palpitations    Vitamin D deficiency     SURGICAL HISTORY: Past Surgical History:  Procedure Laterality Date   ABLATION     D&C Ablation of Uterus   AXILLARY SENTINEL NODE BIOPSY Left 06/17/2021   Procedure: LEFT AXILLARY SENTINEL NODE BIOPSY;  Surgeon: Joan Kussmaul, MD;  Location: [place];  Service: General;  Laterality: Left;   BREAST BIOPSY Left 05/08/2021    BREAST LUMPECTOMY Right 10/1998   BREAST LUMPECTOMY WITH RADIOACTIVE SEED AND SENTINEL LYMPH NODE BIOPSY Left 06/17/2021   Procedure: LEFT BREAST LUMPECTOMY WITH RADIOACTIVE SEEDS x 2;  Surgeon: Joan Kussmaul, MD;  Location: [place];  Service: General;  Laterality: Left;   BREAST REDUCTION SURGERY Bilateral 07/24/2021   Procedure: BILATERAL ONCOPLASTIC BREAST REDUCTION;  Surgeon: Joan Going, DO;  Location: [place];  Service: Plastics;  Laterality: Bilateral;   MASTOPEXY Bilateral 07/24/2021   Procedure: MASTOPEXY;  Surgeon: Joan Going, DO;  Location: [place];  Service: Plastics;  Laterality: Bilateral;    SOCIAL HISTORY: Social History   Socioeconomic History   Marital status: Married    Spouse name: Joan Dalton   Number of children: 5   Years of education: Not on file   Highest education level: Not on file  Occupational History   Not on file  Tobacco Use   Smoking status: Never   Smokeless tobacco: Never  Vaping Use   Vaping Use: Never used  Substance and Sexual Activity   Alcohol use: Never   Drug use: Never   Sexual activity: Yes  Other Topics Concern   Not on file  Social History Narrative   Not on file   Social Determinants of Health   Financial Resource Strain: Not on file  Food Insecurity: Not on file  Transportation Needs: Not on file  Physical Activity: Not on file  Stress: Not on file  Social Connections: Not on file  Intimate Partner Violence: Not At Risk (05/21/2021)   Humiliation, Afraid, Rape, and Kick questionnaire    Fear of Current or Ex-Partner: No    Emotionally Abused: No    Physically Abused: No    Sexually Abused: No    FAMILY HISTORY: Family History  Problem Relation Age of Onset   Breast Dalton Mother        71s & 20s   Hypertension Father    Hyperlipidemia Father    Diabetes Father    Prostate Dalton Father    Heart disease Father    Sleep apnea Father    Obesity Father    Colon Dalton Maternal Grandfather     Breast Dalton Maternal Aunt 75   Pancreatic Dalton Maternal Aunt 75   Breast Dalton Maternal Aunt        later 30s-early 32s   Colon Dalton Maternal Aunt 42   Colon Dalton Maternal Uncle        60s   Pancreatic Dalton Cousin        mother's maternal first cousin   Pancreatic Dalton Cousin        mother's maternal first cousin    Review of Systems - Oncology    PHYSICAL EXAMINATION  ECOG PERFORMANCE STATUS: {CHL ONC ECOG WJ:[id number]}  Vitals:   03/31/22 1250  BP: (!) 148/101  Pulse: (!) 105  Resp: 18  SpO2: 99%    Physical Exam  LABORATORY DATA:  CBC    Component Value Date/Time   WBC 5.0 11/05/2021 0921   WBC  6.1 07/22/2021 0811   RBC 4.23 11/05/2021 0921   RBC 3.79 (L) 07/22/2021 0811   HGB 13.3 11/05/2021 0921   HCT 40.0 11/05/2021 0921   PLT 266 11/05/2021 0921   MCV 95 11/05/2021 0921   MCH 31.4 11/05/2021 0921   MCH 31.4 07/22/2021 0811   MCHC 33.3 11/05/2021 0921   MCHC 33.1 07/22/2021 0811   RDW 12.8 11/05/2021 0921   LYMPHSABS 0.7 11/05/2021 0921   EOSABS 0.1 11/05/2021 0921   BASOSABS 0.0 11/05/2021 0921    CMP     Component Value Date/Time   NA 138 11/05/2021 0921   K 4.6 11/05/2021 0921   CL 100 11/05/2021 0921   CO2 21 11/05/2021 0921   GLUCOSE 87 11/05/2021 0921   GLUCOSE 99 06/12/2021 0836   BUN 8 11/05/2021 0921   CREATININE 0.68 11/05/2021 0921   CALCIUM 9.5 11/05/2021 0921   PROT 7.4 11/05/2021 0921   ALBUMIN 4.5 11/05/2021 0921   AST 13 11/05/2021 0921   ALT 17 11/05/2021 0921   ALKPHOS 68 11/05/2021 0921   BILITOT 0.3 11/05/2021 0921   GFRNONAA >60 06/12/2021 0836       PENDING LABS:   RADIOGRAPHIC STUDIES:  No results found.   PATHOLOGY:     ASSESSMENT and THERAPY PLAN:   No problem-specific Assessment & Plan notes found for this encounter.   No orders of the defined types were placed in this encounter.   All questions were answered. The patient knows to call the clinic with any problems,  questions or concerns. We can certainly see the patient much sooner if necessary. This note was electronically signed. Scot Dock, NP 03/31/2022

## 2022-03-31 NOTE — Progress Notes (Signed)
Chief Complaint:   OBESITY Joan Dalton is here to discuss her progress with her obesity treatment plan along with follow-up of her obesity related diagnoses. Joan Dalton is on the [employer] and states she is following her eating plan approximately 45% of the time. Joan Dalton states she is going to the gym and walking more 20 minutes 2 times per week.  Today's visit was #: 5 Starting weight: 275 lbs Starting date: 11/05/2021 Today's weight: 263 lbs Today's date: 03/13/2022 Total lbs lost to date: 12 lbs Total lbs lost since last in-office visit: 2  Interim History: Joan Dalton reports that she has not committed to meal plan 100%.  She mentioned that she did go grocery shopping recently.  Ate a few indulging snacks over the last week.  She states she will have a low-key local next few weeks.  Subjective:   1. Vitamin D deficiency Joan Dalton is currently taking prescription Vit D 50,000 IU once a week. Denies any nausea, vomiting or muscle weakness. he notes fatigue.  2. Other hyperlipidemia Joan Dalton is not on medication.  Last DL of 137, HDL of 74, trig of 76.  Assessment/Plan:   1. Vitamin D deficiency We will refill Vit D 50K IU once a week for 1 month with 0 refills.  -Refill Vitamin D, Ergocalciferol, (DRISDOL) 1.25 MG (50000 UNIT) CAPS capsule; Take 1 capsule (50,000 Units total) by mouth every 7 (seven) days.  Dispense: 4 capsule; Refill: 0  2. Other hyperlipidemia Will obtain labs in January 2024.  3. Obesity with current BMI of 42.6 Joan Dalton is currently in the action stage of change. As such, her goal is to continue with weight loss efforts. She has agreed to the [employer].   Exercise goals: All adults should avoid inactivity. Some physical activity is better than none, and adults who participate in any amount of physical activity gain some health benefits.  Behavioral modification strategies: increasing lean protein intake, meal planning and cooking  strategies, keeping healthy foods in the home, celebration eating strategies, and planning for success.  Joan Dalton has agreed to follow-up with our clinic in 4 weeks. She was informed of the importance of frequent follow-up visits to maximize her success with intensive lifestyle modifications for her multiple health conditions.   Objective:   Blood pressure (!) 145/89, pulse 87, temperature 98.4 F (36.9 C), height '5\' 6"'$  (1.676 m), weight 263 lb (119.3 kg), SpO2 98 %. Body mass index is 42.45 kg/m.  General: Cooperative, alert, well developed, in no acute distress. HEENT: Conjunctivae and lids unremarkable. Cardiovascular: Regular rhythm.  Lungs: Normal work of breathing. Neurologic: No focal deficits.   Lab Results  Component Value Date   CREATININE 0.68 11/05/2021   BUN 8 11/05/2021   NA 138 11/05/2021   K 4.6 11/05/2021   CL 100 11/05/2021   CO2 21 11/05/2021   Lab Results  Component Value Date   ALT 17 11/05/2021   AST 13 11/05/2021   ALKPHOS 68 11/05/2021   BILITOT 0.3 11/05/2021   Lab Results  Component Value Date   HGBA1C 5.6 11/05/2021   Lab Results  Component Value Date   INSULIN 19.4 11/05/2021   Lab Results  Component Value Date   TSH 1.080 11/05/2021   Lab Results  Component Value Date   CHOL 224 (H) 11/05/2021   HDL 74 11/05/2021   LDLCALC 137 (H) 11/05/2021   TRIG 76 11/05/2021   Lab Results  Component Value Date   VD25OH 17.9 (L) 11/05/2021  Lab Results  Component Value Date   WBC 5.0 11/05/2021   HGB 13.3 11/05/2021   HCT 40.0 11/05/2021   MCV 95 11/05/2021   PLT 266 11/05/2021   No results found for: "IRON", "TIBC", "FERRITIN"  Attestation Statements:   Reviewed by clinician on day of visit: allergies, medications, problem list, medical history, surgical history, family history, social history, and previous encounter notes.  I, Elnora Morrison, RMA am acting as transcriptionist for Coralie Common, MD.  I have reviewed the  above documentation for accuracy and completeness, and I agree with the above. - Coralie Common, MD

## 2022-04-01 ENCOUNTER — Other Ambulatory Visit: Payer: Self-pay | Admitting: Adult Health

## 2022-04-01 ENCOUNTER — Telehealth: Payer: Self-pay

## 2022-04-01 DIAGNOSIS — D0512 Intraductal carcinoma in situ of left breast: Secondary | ICD-10-CM

## 2022-04-01 DIAGNOSIS — N63 Unspecified lump in unspecified breast: Secondary | ICD-10-CM

## 2022-04-01 NOTE — Telephone Encounter (Signed)
Called Pts husband regarding MM and [place] appt with [facility]. Relayed that appt is 1/22 at 0900. Husband states that [facility] phone is not working. Husband verbalized understanding and stated he would relay to Pt.

## 2022-04-01 NOTE — Assessment & Plan Note (Signed)
Joan Dalton is a 47 year old woman with history of left breast DCIS status postlumpectomy, adjuvant radiation, and antiestrogen with tamoxifen daily.  I am unclear what these nodular areas are and the etiology of her back pain/pelvic pain.  Due to this Joan Dalton is very motivated to undergo evaluation for this issue.  I placed orders for CT chest/abdomen/pelvis.    She will continue on Tamoxifen daily and with PT for her lymphedema.  She has f/u with Dr. Chryl Heck in 07/2022.  I will see her for phone visit to touch base after her scans.

## 2022-04-01 NOTE — Progress Notes (Signed)
Joan Dalton Cancer Follow up:    Joan Dalton, [name], MD [address] [place] [address]   DIAGNOSIS:  Cancer Staging  Ductal carcinoma in situ (DCIS) of left breast Staging form: Breast, AJCC 8th Edition - Clinical stage from 06/17/2021: Stage 0 (cTis (DCIS), cN0, cM0, ER+, PR+) - Signed by Gardenia Phlegm, NP on 01/30/2022   SUMMARY OF ONCOLOGIC HISTORY: Oncology History  Ductal carcinoma in situ (DCIS) of left breast  05/20/2021 Initial Diagnosis   Ductal carcinoma in situ (DCIS) of left breast   06/05/2021 Genetic Testing   Negative genetic testing on the CancerNext-Expanded+RNAinsight panel.  The report date is June 05, 2021.  The CancerNext-Expanded gene panel offered by [facility] and includes sequencing and rearrangement analysis for the following 77 genes: AIP, ALK, APC*, ATM*, AXIN2, BAP1, BARD1, BLM, BMPR1A, BRCA1*, BRCA2*, BRIP1*, CDC73, CDH1*, CDK4, CDKN1B, CDKN2A, CHEK2*, CTNNA1, DICER1, FANCC, FH, FLCN, GALNT12, KIF1B, LZTR1, MAX, MEN1, MET, MLH1*, MSH2*, MSH3, MSH6*, MUTYH*, NBN, NF1*, NF2, NTHL1, PALB2*, PHOX2B, PMS2*, POT1, PRKAR1A, PTCH1, PTEN*, RAD51C*, RAD51D*, RB1, RECQL, RET, SDHA, SDHAF2, SDHB, SDHC, SDHD, SMAD4, SMARCA4, SMARCB1, SMARCE1, STK11, SUFU, TMEM127, TP53*, TSC1, TSC2, VHL and XRCC2 (sequencing and deletion/duplication); EGFR, EGLN1, HOXB13, KIT, MITF, PDGFRA, POLD1, and POLE (sequencing only); EPCAM and GREM1 (deletion/duplication only). DNA and RNA analyses performed for * genes.    06/17/2021 Surgery   Patient had left breast lumpectomy which showed high-grade ductal carcinoma in situ with necrosis and calcifications, resection margins are negative for carcinoma left additional superior margin excision again negative for carcinoma.  She had 3 out of 3 lymph nodes without involvement.  Prior prognostic showed ER 85% positive strong staining PR 95% positive strong staining. She is now undergoing plastic surgery, had  right and left breast mammoplasty with no evidence of malignancy.   06/17/2021 Cancer Staging   Staging form: Breast, AJCC 8th Edition - Clinical stage from 06/17/2021: Stage 0 (cTis (DCIS), cN0, cM0, ER+, PR+) - Signed by Gardenia Phlegm, NP on 01/30/2022   08/28/2021 - 10/16/2021 Radiation Therapy   Site Technique Total Dose (Gy) Dose per Fx (Gy) Completed Fx Beam Energies  Breast, Left: Breast_L 3D 50.4/50.4 1.8 28/28 10XFFF  Breast, Left: Breast_L_Bst 3D 10/10 2 5/5 6X, 10X     10/2021 -  Anti-estrogen oral therapy   Tamoxifen     CURRENT THERAPY: Tamoxifen  INTERVAL HISTORY: Joan Dalton 47 y.o. female returns for urgent evaluation of a lump in her left flank area.  She notes that this area was found by physical therapy and she is very concerned about what it may be.  She tells me she is tolerating the tamoxifen without any difficulty but she also has some right lower back/pelvic pain that she has been experiencing for the past couple weeks that also has her nervous.   Patient Active Problem List   Diagnosis Date Noted   Postoperative breast asymmetry 07/08/2021   Genetic testing 06/06/2021   Family history of breast cancer 05/24/2021   Family history of colon cancer 05/24/2021   Family history of pancreatic cancer 05/24/2021   Ductal carcinoma in situ (DCIS) of left breast 05/20/2021    is allergic to sulfamethoxazole-trimethoprim.  MEDICAL HISTORY: Past Medical History:  Diagnosis Date   Anemia    Anxiety    Bilateral swelling of feet    Breast cancer ([place]) 05/08/2021   Constipation    Family history of breast cancer    Family history of  colon cancer    Family history of pancreatic cancer    Fatty liver    Hx of blood clots    Hypertension    Joint pain    Joint pain    Lactose intolerance    Palpitations    Vitamin D deficiency     SURGICAL HISTORY: Past Surgical History:  Procedure Laterality Date   ABLATION     D&C Ablation of Uterus    AXILLARY SENTINEL NODE BIOPSY Left 06/17/2021   Procedure: LEFT AXILLARY SENTINEL NODE BIOPSY;  Surgeon: Jovita Kussmaul, MD;  Location: [facility];  Service: General;  Laterality: Left;   BREAST BIOPSY Left 05/08/2021   BREAST LUMPECTOMY Right 10/1998   BREAST LUMPECTOMY WITH RADIOACTIVE SEED AND SENTINEL LYMPH NODE BIOPSY Left 06/17/2021   Procedure: LEFT BREAST LUMPECTOMY WITH RADIOACTIVE SEEDS x 2;  Surgeon: Jovita Kussmaul, MD;  Location: [facility];  Service: General;  Laterality: Left;   BREAST REDUCTION SURGERY Bilateral 07/24/2021   Procedure: BILATERAL ONCOPLASTIC BREAST REDUCTION;  Surgeon: Wallace Going, DO;  Location: [facility];  Service: Plastics;  Laterality: Bilateral;   MASTOPEXY Bilateral 07/24/2021   Procedure: MASTOPEXY;  Surgeon: Wallace Going, DO;  Location: [facility];  Service: Plastics;  Laterality: Bilateral;    SOCIAL HISTORY: Social History   Socioeconomic History   Marital status: Married    Spouse name: Joan Dalton   Number of children: 5   Years of education: Not on file   Highest education level: Not on file  Occupational History   Not on file  Tobacco Use   Smoking status: Never   Smokeless tobacco: Never  Vaping Use   Vaping Use: Never used  Substance and Sexual Activity   Alcohol use: Never   Drug use: Never   Sexual activity: Yes  Other Topics Concern   Not on file  Social History Narrative   Not on file   Social Determinants of Health   Financial Resource Strain: Not on file  Food Insecurity: Not on file  Transportation Needs: Not on file  Physical Activity: Not on file  Stress: Not on file  Social Connections: Not on file  Intimate Partner Violence: Not At Risk (05/21/2021)   Humiliation, Afraid, Rape, and Kick questionnaire    Fear of Current or Ex-Partner: No    Emotionally Abused: No    Physically Abused: No    Sexually Abused: No    FAMILY HISTORY: Family History  Problem Relation Age of Onset   Breast cancer Mother         68s & 70s   Hypertension Father    Hyperlipidemia Father    Diabetes Father    Prostate cancer Father    Heart disease Father    Sleep apnea Father    Obesity Father    Colon cancer Maternal Grandfather    Breast cancer Maternal Aunt 75   Pancreatic cancer Maternal Aunt 75   Breast cancer Maternal Aunt        later 30s-early 53s   Colon cancer Maternal Aunt 42   Colon cancer Maternal Uncle        60s   Pancreatic cancer Cousin        mother's maternal first cousin   Pancreatic cancer Cousin        mother's maternal first cousin    Review of Systems  Constitutional:  Negative for appetite change, chills, fatigue, fever and unexpected weight change.  HENT:   Negative for  hearing loss, lump/mass, mouth sores and trouble swallowing.   Eyes:  Negative for eye problems and icterus.  Respiratory:  Negative for chest tightness, cough and shortness of breath.   Cardiovascular:  Negative for chest pain, leg swelling and palpitations.  Gastrointestinal:  Negative for abdominal distention, abdominal pain, blood in stool, constipation, diarrhea, nausea, rectal pain and vomiting.  Endocrine: Negative for hot flashes.  Genitourinary:  Positive for pelvic pain. Negative for bladder incontinence, difficulty urinating, dyspareunia, dysuria, frequency, hematuria, menstrual problem, nocturia, vaginal bleeding and vaginal discharge.   Musculoskeletal:  Positive for back pain. Negative for arthralgias.  Skin:  Negative for itching and rash.  Neurological:  Negative for dizziness, extremity weakness, headaches and numbness.  Hematological:  Negative for adenopathy. Does not bruise/bleed easily.  Psychiatric/Behavioral:  Negative for depression. The patient is nervous/anxious.       PHYSICAL EXAMINATION  ECOG PERFORMANCE STATUS: 1 - Symptomatic but completely ambulatory  Vitals:   03/31/22 1250  BP: (!) 148/101  Pulse: (!) 105  Resp: 18  SpO2: 99%    Physical Exam Constitutional:       General: She is not in acute distress.    Appearance: Normal appearance. She is not toxic-appearing.  HENT:     Head: Normocephalic and atraumatic.  Eyes:     General: No scleral icterus. Cardiovascular:     Rate and Rhythm: Normal rate and regular rhythm.     Pulses: Normal pulses.     Heart sounds: Normal heart sounds.  Pulmonary:     Effort: Pulmonary effort is normal.     Breath sounds: Normal breath sounds.  Abdominal:     General: Abdomen is flat. Bowel sounds are normal. There is no distension.     Palpations: Abdomen is soft.     Tenderness: There is no abdominal tenderness.     Comments: Small soft pinpoint rubbery nodules noted in right flank area there is also tender to palpation in left lower lateral back.  Musculoskeletal:        General: No swelling.     Cervical back: Neck supple.  Lymphadenopathy:     Cervical: No cervical adenopathy.  Skin:    General: Skin is warm and dry.     Findings: No rash.  Neurological:     General: No focal deficit present.     Mental Status: She is alert.  Psychiatric:        Mood and Affect: Mood normal.        Behavior: Behavior normal.     LABORATORY DATA:  CBC    Component Value Date/Time   WBC 5.0 11/05/2021 0921   WBC 6.1 07/22/2021 0811   RBC 4.23 11/05/2021 0921   RBC 3.79 (L) 07/22/2021 0811   HGB 13.3 11/05/2021 0921   HCT 40.0 11/05/2021 0921   PLT 266 11/05/2021 0921   MCV 95 11/05/2021 0921   MCH 31.4 11/05/2021 0921   MCH 31.4 07/22/2021 0811   MCHC 33.3 11/05/2021 0921   MCHC 33.1 07/22/2021 0811   RDW 12.8 11/05/2021 0921   LYMPHSABS 0.7 11/05/2021 0921   EOSABS 0.1 11/05/2021 0921   BASOSABS 0.0 11/05/2021 0921    CMP     Component Value Date/Time   NA 138 11/05/2021 0921   K 4.6 11/05/2021 0921   CL 100 11/05/2021 0921   CO2 21 11/05/2021 0921   GLUCOSE 87 11/05/2021 0921   GLUCOSE 99 06/12/2021 0836   BUN 8 11/05/2021 0921   CREATININE 0.68  11/05/2021 0921   CALCIUM 9.5 11/05/2021 0921    PROT 7.4 11/05/2021 0921   ALBUMIN 4.5 11/05/2021 0921   AST 13 11/05/2021 0921   ALT 17 11/05/2021 0921   ALKPHOS 68 11/05/2021 0921   BILITOT 0.3 11/05/2021 0921   GFRNONAA >60 06/12/2021 0836        ASSESSMENT and THERAPY PLAN:   Ductal carcinoma in situ (DCIS) of left breast Joan Dalton is a 47 year old woman with history of left breast DCIS status postlumpectomy, adjuvant radiation, and antiestrogen with tamoxifen daily.  I am unclear what these nodular areas are and the etiology of her back pain/pelvic pain.  Due to this Joan Dalton is very motivated to undergo evaluation for this issue.  I placed orders for CT chest/abdomen/pelvis.    She will continue on Tamoxifen daily and with PT for her lymphedema.  She has f/u with Dr. Chryl Heck in 07/2022.  I will see her for phone visit to touch base after her scans.   All questions were answered. The patient knows to call the clinic with any problems, questions or concerns. We can certainly see the patient much sooner if necessary.  Total encounter time:30 minutes*in face-to-face visit time, chart review, lab review, care coordination, order entry, and documentation of the encounter time.    [name], NP 04/01/22 9:32 AM Medical Oncology and Hematology [facility] [place], [place] [address] Tel. [phone]    Fax. [phone]  *Total Encounter Time as defined by the Centers for Medicare and Medicaid Services includes, in addition to the face-to-face time of a patient visit (documented in the note above) non-face-to-face time: obtaining and reviewing outside history, ordering and reviewing medications, tests or procedures, care coordination (communications with other health care professionals or caregivers) and documentation in the medical record.

## 2022-04-01 NOTE — Progress Notes (Deleted)
[facility] Follow up:    [name], [name], MD [address] [place] [address]   DIAGNOSIS:  Cancer Staging  Ductal carcinoma in situ (DCIS) of left breast Staging form: Breast, AJCC 8th Edition - Clinical stage from 06/17/2021: Stage 0 (cTis (DCIS), cN0, cM0, ER+, PR+) - Signed by Gardenia Phlegm, NP on 01/30/2022   SUMMARY OF ONCOLOGIC HISTORY: Oncology History  Ductal carcinoma in situ (DCIS) of left breast  05/20/2021 Initial Diagnosis   Ductal carcinoma in situ (DCIS) of left breast   06/05/2021 Genetic Testing   Negative genetic testing on the CancerNext-Expanded+RNAinsight panel.  The report date is June 05, 2021.  The CancerNext-Expanded gene panel offered by [facility] and includes sequencing and rearrangement analysis for the following 77 genes: AIP, ALK, APC*, ATM*, AXIN2, BAP1, BARD1, BLM, BMPR1A, BRCA1*, BRCA2*, BRIP1*, CDC73, CDH1*, CDK4, CDKN1B, CDKN2A, CHEK2*, CTNNA1, DICER1, FANCC, FH, FLCN, GALNT12, KIF1B, LZTR1, MAX, MEN1, MET, MLH1*, MSH2*, MSH3, MSH6*, MUTYH*, NBN, NF1*, NF2, NTHL1, PALB2*, PHOX2B, PMS2*, POT1, PRKAR1A, PTCH1, PTEN*, RAD51C*, RAD51D*, RB1, RECQL, RET, SDHA, SDHAF2, SDHB, SDHC, SDHD, SMAD4, SMARCA4, SMARCB1, SMARCE1, STK11, SUFU, TMEM127, TP53*, TSC1, TSC2, VHL and XRCC2 (sequencing and deletion/duplication); EGFR, EGLN1, HOXB13, KIT, MITF, PDGFRA, POLD1, and POLE (sequencing only); EPCAM and GREM1 (deletion/duplication only). DNA and RNA analyses performed for * genes.    06/17/2021 Surgery   Patient had left breast lumpectomy which showed high-grade ductal carcinoma in situ with necrosis and calcifications, resection margins are negative for carcinoma left additional superior margin excision again negative for carcinoma.  She had 3 out of 3 lymph nodes without involvement.  Prior prognostic showed ER 85% positive strong staining PR 95% positive strong staining. She is now undergoing plastic surgery, had  right and left breast mammoplasty with no evidence of malignancy.   06/17/2021 Cancer Staging   Staging form: Breast, AJCC 8th Edition - Clinical stage from 06/17/2021: Stage 0 (cTis (DCIS), cN0, cM0, ER+, PR+) - Signed by Gardenia Phlegm, NP on 01/30/2022   08/28/2021 - 10/16/2021 Radiation Therapy   Site Technique Total Dose (Gy) Dose per Fx (Gy) Completed Fx Beam Energies  Breast, Left: Breast_L 3D 50.4/50.4 1.8 28/28 10XFFF  Breast, Left: Breast_L_Bst 3D 10/10 2 5/5 6X, 10X    10/2021 -  Anti-estrogen oral therapy   Tamoxifen     CURRENT THERAPY: Tamoxifen  INTERVAL HISTORY: Chauncey Mann 47 y.o. female returns for urgent evaluation of a lump in her left flank area.  She notes that this area was found by physical therapy and she is very concerned about what it may be.  She tells me she is tolerating the tamoxifen without any difficulty but she also has some right lower back/pelvic pain that she has been experiencing for the past couple weeks that also has her nervous.   Patient Active Problem List   Diagnosis Date Noted   Postoperative breast asymmetry 07/08/2021   Genetic testing 06/06/2021   Family history of breast cancer 05/24/2021   Family history of colon cancer 05/24/2021   Family history of pancreatic cancer 05/24/2021   Ductal carcinoma in situ (DCIS) of left breast 05/20/2021    is allergic to sulfamethoxazole-trimethoprim.  MEDICAL HISTORY: Past Medical History:  Diagnosis Date   Anemia    Anxiety    Bilateral swelling of feet    Breast cancer ([place]) 05/08/2021   Constipation    Family history of breast cancer    Family history of colon  cancer    Family history of pancreatic cancer    Fatty liver    Hx of blood clots    Hypertension    Joint pain    Joint pain    Lactose intolerance    Palpitations    Vitamin D deficiency     SURGICAL HISTORY: Past Surgical History:  Procedure Laterality Date   ABLATION     D&C Ablation of Uterus    AXILLARY SENTINEL NODE BIOPSY Left 06/17/2021   Procedure: LEFT AXILLARY SENTINEL NODE BIOPSY;  Surgeon: Jovita Kussmaul, MD;  Location: [place];  Service: General;  Laterality: Left;   BREAST BIOPSY Left 05/08/2021   BREAST LUMPECTOMY Right 10/1998   BREAST LUMPECTOMY WITH RADIOACTIVE SEED AND SENTINEL LYMPH NODE BIOPSY Left 06/17/2021   Procedure: LEFT BREAST LUMPECTOMY WITH RADIOACTIVE SEEDS x 2;  Surgeon: Jovita Kussmaul, MD;  Location: [place];  Service: General;  Laterality: Left;   BREAST REDUCTION SURGERY Bilateral 07/24/2021   Procedure: BILATERAL ONCOPLASTIC BREAST REDUCTION;  Surgeon: Wallace Going, DO;  Location: [place];  Service: Plastics;  Laterality: Bilateral;   MASTOPEXY Bilateral 07/24/2021   Procedure: MASTOPEXY;  Surgeon: Wallace Going, DO;  Location: [place];  Service: Plastics;  Laterality: Bilateral;    SOCIAL HISTORY: Social History   Socioeconomic History   Marital status: Married    Spouse name: ZOE GOONAN II   Number of children: 5   Years of education: Not on file   Highest education level: Not on file  Occupational History   Not on file  Tobacco Use   Smoking status: Never   Smokeless tobacco: Never  Vaping Use   Vaping Use: Never used  Substance and Sexual Activity   Alcohol use: Never   Drug use: Never   Sexual activity: Yes  Other Topics Concern   Not on file  Social History Narrative   Not on file   Social Determinants of Health   Financial Resource Strain: Not on file  Food Insecurity: Not on file  Transportation Needs: Not on file  Physical Activity: Not on file  Stress: Not on file  Social Connections: Not on file  Intimate Partner Violence: Not At Risk (05/21/2021)   Humiliation, Afraid, Rape, and Kick questionnaire    Fear of Current or Ex-Partner: No    Emotionally Abused: No    Physically Abused: No    Sexually Abused: No    FAMILY HISTORY: Family History  Problem Relation Age of Onset   Breast cancer Mother         72s & 83s   Hypertension Father    Hyperlipidemia Father    Diabetes Father    Prostate cancer Father    Heart disease Father    Sleep apnea Father    Obesity Father    Colon cancer Maternal Grandfather    Breast cancer Maternal Aunt 75   Pancreatic cancer Maternal Aunt 75   Breast cancer Maternal Aunt        later 30s-early 20s   Colon cancer Maternal Aunt 42   Colon cancer Maternal Uncle        60s   Pancreatic cancer Cousin        mother's maternal first cousin   Pancreatic cancer Cousin        mother's maternal first cousin    Review of Systems - Oncology    PHYSICAL EXAMINATION  ECOG PERFORMANCE STATUS: {CHL ONC ECOG VZ:[id number]}  Vitals:   03/31/22 1250  BP: (!) 148/101  Pulse: (!) 105  Resp: 18  SpO2: 99%    Physical Exam  LABORATORY DATA:  CBC    Component Value Date/Time   WBC 5.0 11/05/2021 0921   WBC 6.1 07/22/2021 0811   RBC 4.23 11/05/2021 0921   RBC 3.79 (L) 07/22/2021 0811   HGB 13.3 11/05/2021 0921   HCT 40.0 11/05/2021 0921   PLT 266 11/05/2021 0921   MCV 95 11/05/2021 0921   MCH 31.4 11/05/2021 0921   MCH 31.4 07/22/2021 0811   MCHC 33.3 11/05/2021 0921   MCHC 33.1 07/22/2021 0811   RDW 12.8 11/05/2021 0921   LYMPHSABS 0.7 11/05/2021 0921   EOSABS 0.1 11/05/2021 0921   BASOSABS 0.0 11/05/2021 0921    CMP     Component Value Date/Time   NA 138 11/05/2021 0921   K 4.6 11/05/2021 0921   CL 100 11/05/2021 0921   CO2 21 11/05/2021 0921   GLUCOSE 87 11/05/2021 0921   GLUCOSE 99 06/12/2021 0836   BUN 8 11/05/2021 0921   CREATININE 0.68 11/05/2021 0921   CALCIUM 9.5 11/05/2021 0921   PROT 7.4 11/05/2021 0921   ALBUMIN 4.5 11/05/2021 0921   AST 13 11/05/2021 0921   ALT 17 11/05/2021 0921   ALKPHOS 68 11/05/2021 0921   BILITOT 0.3 11/05/2021 0921   GFRNONAA >60 06/12/2021 0836       PENDING LABS:   RADIOGRAPHIC STUDIES:  No results found.   PATHOLOGY:     ASSESSMENT and THERAPY PLAN:   No  problem-specific Assessment & Plan notes found for this encounter.   Orders Placed This Encounter  Procedures   CT CHEST ABDOMEN PELVIS W CONTRAST    Standing Status:   Future    Standing Expiration Date:   04/01/2023    Order Specific Question:   If indicated for the ordered procedure, I authorize the administration of contrast media per Radiology protocol    Answer:   Yes    Order Specific Question:   Does the patient have a contrast media/X-ray dye allergy?    Answer:   No    Order Specific Question:   Is patient pregnant?    Answer:   No    Order Specific Question:   Preferred imaging location?    Answer[facility]   [facility]    Order Specific Question:   Is Oral Contrast requested for this exam?    Answer:   Yes, Per Radiology protocol     All questions were answered. The patient knows to call the clinic with any problems, questions or concerns. We can certainly see the patient much sooner if necessary. This note was electronically signed. Scot Dock, NP 04/01/2022

## 2022-04-02 ENCOUNTER — Ambulatory Visit: Payer: BC Managed Care – PPO

## 2022-04-02 DIAGNOSIS — R293 Abnormal posture: Secondary | ICD-10-CM

## 2022-04-02 DIAGNOSIS — I890 Lymphedema, not elsewhere classified: Secondary | ICD-10-CM

## 2022-04-02 DIAGNOSIS — M25612 Stiffness of left shoulder, not elsewhere classified: Secondary | ICD-10-CM

## 2022-04-02 DIAGNOSIS — D0512 Intraductal carcinoma in situ of left breast: Secondary | ICD-10-CM

## 2022-04-02 NOTE — Therapy (Signed)
OUTPATIENT PHYSICAL THERAPY ONCOLOGY TREATMENT  Patient Name: Joan Dalton MRN: [id number] DOB:[birth year], 47 y.o., female Today's Date: 04/02/2022  END OF SESSION:  PT End of Session - 04/02/22 1124     Visit Number 11    Number of Visits 17    Date for PT Re-Evaluation 04/23/22    PT Start Time 1123    PT Stop Time 1205   Pt had to leave for a meeting   PT Time Calculation (min) 42 min    Activity Tolerance Patient tolerated treatment well    Behavior During Therapy [facility] for tasks assessed/performed             Past Medical History:  Diagnosis Date   Anemia    Anxiety    Bilateral swelling of feet    Breast cancer ([place]) 05/08/2021   Constipation    Family history of breast cancer    Family history of colon cancer    Family history of pancreatic cancer    Fatty liver    Hx of blood clots    Hypertension    Joint pain    Joint pain    Lactose intolerance    Palpitations    Vitamin D deficiency    Past Surgical History:  Procedure Laterality Date   ABLATION     D&C Ablation of Uterus   AXILLARY SENTINEL NODE BIOPSY Left 06/17/2021   Procedure: LEFT AXILLARY SENTINEL NODE BIOPSY;  Surgeon: Jovita Kussmaul, MD;  Location: [place];  Service: General;  Laterality: Left;   BREAST BIOPSY Left 05/08/2021   BREAST LUMPECTOMY Right 10/1998   BREAST LUMPECTOMY WITH RADIOACTIVE SEED AND SENTINEL LYMPH NODE BIOPSY Left 06/17/2021   Procedure: LEFT BREAST LUMPECTOMY WITH RADIOACTIVE SEEDS x 2;  Surgeon: Jovita Kussmaul, MD;  Location: [place];  Service: General;  Laterality: Left;   BREAST REDUCTION SURGERY Bilateral 07/24/2021   Procedure: BILATERAL ONCOPLASTIC BREAST REDUCTION;  Surgeon: Wallace Going, DO;  Location: [place];  Service: Plastics;  Laterality: Bilateral;   MASTOPEXY Bilateral 07/24/2021   Procedure: MASTOPEXY;  Surgeon: Wallace Going, DO;  Location: [place];  Service: Plastics;  Laterality: Bilateral;   Patient Active Problem List   Diagnosis  Date Noted   Postoperative breast asymmetry 07/08/2021   Genetic testing 06/06/2021   Family history of breast cancer 05/24/2021   Family history of colon cancer 05/24/2021   Family history of pancreatic cancer 05/24/2021   Ductal carcinoma in situ (DCIS) of left breast 05/20/2021    PCP: Early Osmond, MD  REFERRING PROVIDER: Gardenia Phlegm, NP  REFERRING DIAG: [phone] (ICD-10-CM) - Intraductal carcinoma in situ of left breast   THERAPY DIAG:  Lymphedema, not elsewhere classified  Stiffness of left shoulder, not elsewhere classified  Abnormal posture  Intraductal carcinoma in situ of left breast  ONSET DATE: 12/22/21  Rationale for Evaluation and Treatment: Rehabilitation  SUBJECTIVE:  SUBJECTIVE STATEMENT: I need to leave early today for another appt. My Lt breast overall is doing much better. And I've been doing the new exercises Hilaria Ota gave me last time. Those are going well.   PERTINENT HISTORY: Patient was diagnosed on 05/14/21 with L breast cancer. It measures 2.8 cm and is located in the upper outer quadrant. 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 06/17/21- L breast lumpectomy and SLNB 0/3, 07/24/21- bilateral oncoplastic breast reduction, Previous hx of  lumpectomy in 2000 on the R side for a nodule (not cancerous), hx of blood clot in 2021  PAIN:  Are you having pain? No  PRECAUTIONS: Other: L breast lymphedema  WEIGHT BEARING RESTRICTIONS: No  FALLS:  Has patient fallen in last 6 months? No  LIVING ENVIRONMENT: Lives with: lives with their family and lives with their spouse 5 children aged 1, 69, 72, 57, 95 Lives in: House/apartment Has following equipment at home:  None  OCCUPATION:  part time- grad Naval architect, full time Statistician in speech language pathology   LEISURE:  pt recently got Peloton fixed and plans to begin biking again  HAND DOMINANCE: right   PRIOR LEVEL OF FUNCTION: Independent  PATIENT GOALS: to reduce the pain and tenderness in L breast and learn how to manage lymphedema   OBJECTIVE:  COGNITION: Overall cognitive status: Within functional limits for tasks assessed   PALPATION: Thickened/fibrotic edema in L breast  OBSERVATIONS / OTHER ASSESSMENTS: peau d orange skin of L breast, L breast larger than R  POSTURE:  Forward head and rounded shoulders posture   UPPER EXTREMITY AROM/PROM:   A/PROM RIGHT  05/16/2021    Shoulder extension 76  Shoulder flexion 153  Shoulder abduction 162  Shoulder internal rotation 63  Shoulder external rotation 75                          (Blank rows = not tested)   A/PROM LEFT  05/16/2021 LEFT 02/19/22 LEFT 03/11/22  Shoulder extension 66 62   Shoulder flexion 153 140 155  Shoulder abduction 156 112 146  Shoulder internal rotation 63 55   Shoulder external rotation 74 85                           (Blank rows = not tested       UPPER EXTREMITY STRENGTH: 5/5     LYMPHEDEMA ASSESSMENTS:    LANDMARK RIGHT  05/16/2021 RIGHT 02/19/22  10 cm proximal to olecranon process 38.6 37.5  Olecranon process '31 30  10 '$ cm proximal to ulnar styloid process 27 26  Just proximal to ulnar styloid process 19.7 20  Across hand at thumb web space 22 22  At base of 2nd digit 8 7.8  (Blank rows = not tested)   LANDMARK LEFT  05/16/2021 LEFT 02/19/22  10 cm proximal to olecranon process 38 38.5  Olecranon process '31 31  10 '$ cm proximal to ulnar styloid process 26.9 25.5  Just proximal to ulnar styloid process 20.7 19.4  Across hand at thumb web space 21.5 21.7  At base of 2nd digit 7.5 7.5  (Blank rows = not tested)  LYMPHEDEMA ASSESSMENTS:   SURGERY TYPE/DATE: 06/17/21- L br lumpectomy and SLNB; 07/24/21 - bilateral oncoplastic breast reduction; hx of R lumpectomy in 2000 - non cancerous  NUMBER OF LYMPH  NODES REMOVED: 0/3  CHEMOTHERAPY: none  RADIATION:complted on 10/16/21  HORMONE TREATMENT: began 10/2021- on  Tamoxifen  INFECTIONS: none   FUNCTIONAL TESTS:  Breast Complaints Scale-  46     L-DEX LYMPHEDEMA SCREENING:  The patient was assessed using the L-Dex machine today to produce a lymphedema index baseline score. The patient will be reassessed on a regular basis (typically every 3 months) to obtain new L-Dex scores. If the score is > 6.5 points away from his/her baseline score indicating onset of subclinical lymphedema, it will be recommended to wear a compression garment for 4 weeks, 12 hours per day and then be reassessed. If the score continues to be > 6.5 points from baseline at reassessment, we will initiate lymphedema treatment. Assessing in this manner has a 95% rate of preventing clinically significant lymphedema.     TODAY'S TREATMENT:                                                                                                                                         DATE:  04/02/22: Manual Therapy MLD In supine: Short neck, superficial and deep abdominals, Rt axillary nodes, Rt intact upper quadrant sequence and establishment of anterior inter-axillary pathway, Lt inguinal nodes and establishment of Lt axillo-inguinal pathway, then Lt breast moving fluid towards pathways spending extra time in area of fibrosis at medial breast then retracing all steps.  PROM to Lt shoulder with scapular depression throughout by therapist into flexion, abd and D2 to pts available end motions STM to scar tissue in Lt axilla during P/ROM  03/31/22: In supine: Short neck, 5 diaphragmatic breaths, R axillary nodes and establishment of interaxillary pathway, L inguinal nodes and establishment of axilloinguinal pathway, then L breast moving fluid towards pathways spending extra time in any areas of fibrosis then retracing all steps.  PROM to L shoulder with v/c to keep shoulder relaxed with pt  feeling increased tightness in L axilla Instructed pt in supine scapular strengthening exercises with yellow band x 10 reps each with pt returning therapist demo, therapist gave v/c and t/c for overhead flexion narrow and wide grip, diagonals, ER and horizontal abduction  03/28/22: In supine: Short neck, 5 diaphragmatic breaths, R axillary nodes and establishment of interaxillary pathway, L inguinal nodes and establishment of axilloinguinal pathway, then L breast moving fluid towards pathways spending extra time in any areas of fibrosis then retracing all steps.   03/26/22: Manual Therapy Reassessed goals for renewal In supine: Short neck, 5 diaphragmatic breaths, Rt axillary nodes and establishment of anterior inter-axillary pathway, L tinguinal nodes and establishment of Lt axillo-inguinal pathway, then Lt breast moving fluid towards pathways, then briefly into Rt S/L to teach pt how to better reach lateral aspect of breast and Lt axillo-inguinal anastomosis; then finished retracing all steps in supine spending extra time in any areas of fibrosis reviewing with pt throughout correct pressure and to only stretch skin, not slide, with hand over hand pressure.  P/ROM to Lt shoulder into flexion and  abd briefly with scapular depression. STM to Lt axilla over scar tissue and where pt reports tenderness to touch that improved     PATIENT EDUCATION:  Education details: anatomy and physiology of the lymphatic system, need for compression bra, chip pack, basic principles of MLD Person educated: Patient Education method: Explanation Education comprehension: verbalized understanding  HOME EXERCISE PROGRAM: Wear compression bra as much as possible  ASSESSMENT:  CLINICAL IMPRESSION: Pt reports compliance with new HEP issued to her at last session. Today continued with focus on MLD to Lt breast and end P/ROM stretching with STM to scar tissue at her Lt axilla. Pt did better with relaxing during  stretching and the fibrosis at her medial breast continues to feel softer.   OBJECTIVE IMPAIRMENTS: decreased knowledge of condition, decreased knowledge of use of DME, decreased ROM, decreased strength, increased edema, increased fascial restrictions, impaired UE functional use, postural dysfunction, and pain.   ACTIVITY LIMITATIONS: sleeping and reach over head  PARTICIPATION LIMITATIONS:  none  PERSONAL FACTORS: Time since onset of injury/illness/exacerbation are also affecting patient's functional outcome.   REHAB POTENTIAL: Good  CLINICAL DECISION MAKING: Stable/uncomplicated  EVALUATION COMPLEXITY: Low  GOALS: Goals reviewed with patient? Yes  SHORT TERM GOALS=LONG TERM GOALS Target date: 03/19/22  Pt will be independent in self MLD for long term management of lymphedema.  Baseline: no knowledge; Pt reports her and her husband are managing but will benefit from further review - 03/26/22 Goal status: Progress towards goal  2.  Pt will demonstrate decreased pore size and improved skin mobility in L breast to allow improved comfort.  Baseline: Pores are still present though are less noticeable, and skin is more pliable - 03/26/22 Goal status: Progress towards goal  3.  Pt will demonstrate 150 degrees of L shoulder abduction to allow her to reach out to the side.  Baseline: 112; 142 degrees - 03/26/22 Goal status:Progress towards goal  4.  Pt will be independent in a home exericse program for continued stretching and strengthening.  Baseline: Pt is working towards independence with initial HEP, we will be switching focus  Goal status: Progress towards goals   PLAN:  PT FREQUENCY: 2x/week  PT DURATION: 4 weeks  PLANNED INTERVENTIONS: Therapeutic exercises, Therapeutic activity, Patient/Family education, Self Care, Joint mobilization, Orthotic/Fit training, Manual lymph drainage, Compression bandaging, scar mobilization, Taping, Vasopneumatic device, Manual therapy, and  Re-evaluation  PLAN FOR NEXT SESSION: give HEP for UE stretches, how were band exercises, how is snow angel stretch - add to HEP,  STM to lateral trunk, cont and review MLD to L breast, how is compression bra? How was chip pack? ROM to L shoulder both PROM and AAROM, give HEP for this and progress prn, compression pump?    Otelia Limes, PTA 04/02/2022, 12:13 PM

## 2022-04-07 ENCOUNTER — Encounter (HOSPITAL_COMMUNITY): Payer: Self-pay

## 2022-04-07 ENCOUNTER — Ambulatory Visit (HOSPITAL_COMMUNITY)
Admission: RE | Admit: 2022-04-07 | Discharge: 2022-04-07 | Disposition: A | Discharge status: Home / Self Care | Payer: BC Managed Care – PPO | Source: Ambulatory Visit | Attending: Adult Health | Admitting: Adult Health

## 2022-04-07 DIAGNOSIS — D0512 Intraductal carcinoma in situ of left breast: Secondary | ICD-10-CM | POA: Diagnosis not present

## 2022-04-07 DIAGNOSIS — R229 Localized swelling, mass and lump, unspecified: Secondary | ICD-10-CM | POA: Diagnosis present

## 2022-04-07 DIAGNOSIS — R102 Pelvic and perineal pain unspecified side: Secondary | ICD-10-CM

## 2022-04-07 MED ORDER — IOHEXOL 300 MG/ML  SOLN
100.0000 mL | Freq: Once | INTRAMUSCULAR | Status: AC | PRN
  Administered 2022-04-07: 100 mL via INTRAVENOUS

## 2022-04-08 ENCOUNTER — Ambulatory Visit: Payer: BC Managed Care – PPO | Admitting: Physical Therapy

## 2022-04-08 ENCOUNTER — Encounter: Payer: Self-pay | Admitting: Physical Therapy

## 2022-04-08 DIAGNOSIS — I890 Lymphedema, not elsewhere classified: Secondary | ICD-10-CM

## 2022-04-08 DIAGNOSIS — M25612 Stiffness of left shoulder, not elsewhere classified: Secondary | ICD-10-CM

## 2022-04-08 DIAGNOSIS — R293 Abnormal posture: Secondary | ICD-10-CM

## 2022-04-08 DIAGNOSIS — D0512 Intraductal carcinoma in situ of left breast: Secondary | ICD-10-CM

## 2022-04-08 NOTE — Therapy (Signed)
OUTPATIENT PHYSICAL THERAPY ONCOLOGY TREATMENT  Patient Name: Joan Dalton MRN: [id number] DOB:November 06, 1975, 47 y.o., female Today's Date: 04/08/2022  END OF SESSION:  PT End of Session - 04/08/22 0907     Visit Number 12    Number of Visits 17    Date for PT Re-Evaluation 04/23/22    PT Start Time 0905    Activity Tolerance Patient tolerated treatment well    Behavior During Therapy [facility] for tasks assessed/performed             Past Medical History:  Diagnosis Date   Anemia    Anxiety    Bilateral swelling of feet    Breast cancer ([place]) 05/08/2021   Constipation    Family history of breast cancer    Family history of colon cancer    Family history of pancreatic cancer    Fatty liver    Hx of blood clots    Hypertension    Joint pain    Joint pain    Lactose intolerance    Palpitations    Vitamin D deficiency    Past Surgical History:  Procedure Laterality Date   ABLATION     D&C Ablation of Uterus   AXILLARY SENTINEL NODE BIOPSY Left 06/17/2021   Procedure: LEFT AXILLARY SENTINEL NODE BIOPSY;  Surgeon: Jovita Kussmaul, MD;  Location: [place];  Service: General;  Laterality: Left;   BREAST BIOPSY Left 05/08/2021   BREAST LUMPECTOMY Right 10/1998   BREAST LUMPECTOMY WITH RADIOACTIVE SEED AND SENTINEL LYMPH NODE BIOPSY Left 06/17/2021   Procedure: LEFT BREAST LUMPECTOMY WITH RADIOACTIVE SEEDS x 2;  Surgeon: Jovita Kussmaul, MD;  Location: [place];  Service: General;  Laterality: Left;   BREAST REDUCTION SURGERY Bilateral 07/24/2021   Procedure: BILATERAL ONCOPLASTIC BREAST REDUCTION;  Surgeon: Wallace Going, DO;  Location: [place];  Service: Plastics;  Laterality: Bilateral;   MASTOPEXY Bilateral 07/24/2021   Procedure: MASTOPEXY;  Surgeon: Wallace Going, DO;  Location: [place];  Service: Plastics;  Laterality: Bilateral;   Patient Active Problem List   Diagnosis Date Noted   Postoperative breast asymmetry 07/08/2021   Genetic testing 06/06/2021    Family history of breast cancer 05/24/2021   Family history of colon cancer 05/24/2021   Family history of pancreatic cancer 05/24/2021   Ductal carcinoma in situ (DCIS) of left breast 05/20/2021    PCP: Early Osmond, MD  REFERRING PROVIDER: Gardenia Phlegm, NP  REFERRING DIAG: [phone] (ICD-10-CM) - Intraductal carcinoma in situ of left breast   THERAPY DIAG:  Lymphedema, not elsewhere classified  Stiffness of left shoulder, not elsewhere classified  Abnormal posture  Intraductal carcinoma in situ of left breast  ONSET DATE: 12/22/21  Rationale for Evaluation and Treatment: Rehabilitation  SUBJECTIVE:  SUBJECTIVE STATEMENT: I lost my exercise page and I can't remember them. The breast is feeling fine.   PERTINENT HISTORY: Patient was diagnosed on 05/14/21 with L breast cancer. It measures 2.8 cm and is located in the upper outer quadrant. 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 06/17/21- L breast lumpectomy and SLNB 0/3, 07/24/21- bilateral oncoplastic breast reduction, Previous hx of  lumpectomy in 2000 on the R side for a nodule (not cancerous), hx of blood clot in 2021  PAIN:  Are you having pain? No  PRECAUTIONS: Other: L breast lymphedema  WEIGHT BEARING RESTRICTIONS: No  FALLS:  Has patient fallen in last 6 months? 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, full time Statistician in speech language pathology   LEISURE: pt recently got [employer] fixed and plans to begin biking again  HAND DOMINANCE: right   PRIOR LEVEL OF FUNCTION: Independent  PATIENT GOALS: to reduce the pain and tenderness in L  breast and learn how to manage lymphedema   OBJECTIVE:  COGNITION: Overall cognitive status: Within functional limits for tasks assessed   PALPATION: Thickened/fibrotic edema in L breast  OBSERVATIONS / OTHER ASSESSMENTS: peau d orange skin of L breast, L breast larger than R  POSTURE:  Forward head and rounded shoulders posture   UPPER EXTREMITY AROM/PROM:   A/PROM RIGHT  05/16/2021    Shoulder extension 76  Shoulder flexion 153  Shoulder abduction 162  Shoulder internal rotation 63  Shoulder external rotation 75                          (Blank rows = not tested)   A/PROM LEFT  05/16/2021 LEFT 02/19/22 LEFT 03/11/22  Shoulder extension 66 62   Shoulder flexion 153 140 155  Shoulder abduction 156 112 146  Shoulder internal rotation 63 55   Shoulder external rotation 74 85                           (Blank rows = not tested       UPPER EXTREMITY STRENGTH: 5/5     LYMPHEDEMA ASSESSMENTS:    LANDMARK RIGHT  05/16/2021 RIGHT 02/19/22  10 cm proximal to olecranon process 38.6 37.5  Olecranon process '31 30  10 '$ cm proximal to ulnar styloid process 27 26  Just proximal to ulnar styloid process 19.7 20  Across hand at thumb web space 22 22  At base of 2nd digit 8 7.8  (Blank rows = not tested)   LANDMARK LEFT  05/16/2021 LEFT 02/19/22  10 cm proximal to olecranon process 38 38.5  Olecranon process '31 31  10 '$ cm proximal to ulnar styloid process 26.9 25.5  Just proximal to ulnar styloid process 20.7 19.4  Across hand at thumb web space 21.5 21.7  At base of 2nd digit 7.5 7.5  (Blank rows = not tested)  LYMPHEDEMA ASSESSMENTS:   SURGERY TYPE/DATE: 06/17/21- L br lumpectomy and SLNB; 07/24/21 - bilateral oncoplastic breast reduction; hx of R lumpectomy in 2000 - non cancerous  NUMBER OF LYMPH NODES REMOVED: 0/3  CHEMOTHERAPY: none  RADIATION:complted on 10/16/21  HORMONE TREATMENT: began 10/2021- on Tamoxifen  INFECTIONS: none   FUNCTIONAL TESTS:  Breast Complaints  Scale-  46  L-DEX LYMPHEDEMA SCREENING:  The patient was assessed using the L-Dex machine today to produce a lymphedema index baseline score. The patient will be reassessed on a regular basis (typically every 3 months) to obtain new L-Dex scores. If the score is > 6.5 points away from his/her baseline score indicating onset of subclinical lymphedema, it will be recommended to wear a compression garment for 4 weeks, 12 hours per day and then be reassessed. If the score continues to be > 6.5 points from baseline at reassessment, we will initiate lymphedema treatment. Assessing in this manner has a 95% rate of preventing clinically significant lymphedema.     TODAY'S TREATMENT:                                                                                                                                         DATE:  04/08/22: Manual Therapy MLD In supine: Short neck, superficial and deep abdominals, Rt axillary nodes, Rt intact upper quadrant sequence and establishment of anterior inter-axillary pathway, Lt inguinal nodes and establishment of Lt axillo-inguinal pathway, then Lt breast moving fluid towards pathways spending extra time in area of fibrosis at medial breast then retracing all steps.  PROM in to flexion and abduction with some tightness in inferior breast STM to scar tissue in L inferior breast to decrease scar tissue  04/02/22: Manual Therapy MLD In supine: Short neck, superficial and deep abdominals, Rt axillary nodes, Rt intact upper quadrant sequence and establishment of anterior inter-axillary pathway, Lt inguinal nodes and establishment of Lt axillo-inguinal pathway, then Lt breast moving fluid towards pathways spending extra time in area of fibrosis at medial breast then retracing all steps.  PROM to Lt shoulder with scapular depression throughout by therapist into flexion, abd and D2 to pts available end motions STM to scar tissue in Lt axilla during P/ROM  03/31/22: In  supine: Short neck, 5 diaphragmatic breaths, R axillary nodes and establishment of interaxillary pathway, L inguinal nodes and establishment of axilloinguinal pathway, then L breast moving fluid towards pathways spending extra time in any areas of fibrosis then retracing all steps.  PROM to L shoulder with v/c to keep shoulder relaxed with pt feeling increased tightness in L axilla Instructed pt in supine scapular strengthening exercises with yellow band x 10 reps each with pt returning therapist demo, therapist gave v/c and t/c for overhead flexion narrow and wide grip, diagonals, ER and horizontal abduction  03/28/22: In supine: Short neck, 5 diaphragmatic breaths, R axillary nodes and establishment of interaxillary pathway, L inguinal nodes and establishment of axilloinguinal pathway, then L breast moving fluid towards pathways spending extra time in any areas of fibrosis then retracing all steps.   03/26/22: Manual Therapy Reassessed goals for renewal In supine: Short neck, 5 diaphragmatic breaths, Rt axillary nodes and establishment of anterior inter-axillary pathway, L tinguinal nodes and establishment of Lt axillo-inguinal pathway, then Lt breast moving  fluid towards pathways, then briefly into Rt S/L to teach pt how to better reach lateral aspect of breast and Lt axillo-inguinal anastomosis; then finished retracing all steps in supine spending extra time in any areas of fibrosis reviewing with pt throughout correct pressure and to only stretch skin, not slide, with hand over hand pressure.  P/ROM to Lt shoulder into flexion and abd briefly with scapular depression. STM to Lt axilla over scar tissue and where pt reports tenderness to touch that improved     PATIENT EDUCATION:  Education details: anatomy and physiology of the lymphatic system, need for compression bra, chip pack, basic principles of MLD Person educated: Patient Education method: Explanation Education comprehension: verbalized  understanding  HOME EXERCISE PROGRAM: Wear compression bra as much as possible  ASSESSMENT:  CLINICAL IMPRESSION: Pt lost her exercise sheet so issued her another. Today continued with focus on MLD to Lt breast and end P/ROM stretching with STM to scar tissue at her Lt inferior breast. Pt ROM is improving though she still does feel a stretch in her L inferior breast so focused on STM to this area today.   OBJECTIVE IMPAIRMENTS: decreased knowledge of condition, decreased knowledge of use of DME, decreased ROM, decreased strength, increased edema, increased fascial restrictions, impaired UE functional use, postural dysfunction, and pain.   ACTIVITY LIMITATIONS: sleeping and reach over head  PARTICIPATION LIMITATIONS:  none  PERSONAL FACTORS: Time since onset of injury/illness/exacerbation are also affecting patient's functional outcome.   REHAB POTENTIAL: Good  CLINICAL DECISION MAKING: Stable/uncomplicated  EVALUATION COMPLEXITY: Low  GOALS: Goals reviewed with patient? Yes  SHORT TERM GOALS=LONG TERM GOALS Target date: 03/19/22  Pt will be independent in self MLD for long term management of lymphedema.  Baseline: no knowledge; Pt reports her and her husband are managing but will benefit from further review - 03/26/22 Goal status: Progress towards goal  2.  Pt will demonstrate decreased pore size and improved skin mobility in L breast to allow improved comfort.  Baseline: Pores are still present though are less noticeable, and skin is more pliable - 03/26/22 Goal status: Progress towards goal  3.  Pt will demonstrate 150 degrees of L shoulder abduction to allow her to reach out to the side.  Baseline: 112; 142 degrees - 03/26/22 Goal status:Progress towards goal  4.  Pt will be independent in a home exericse program for continued stretching and strengthening.  Baseline: Pt is working towards independence with initial HEP, we will be switching focus  Goal status: Progress towards  goals   PLAN:  PT FREQUENCY: 2x/week  PT DURATION: 4 weeks  PLANNED INTERVENTIONS: Therapeutic exercises, Therapeutic activity, Patient/Family education, Self Care, Joint mobilization, Orthotic/Fit training, Manual lymph drainage, Compression bandaging, scar mobilization, Taping, Vasopneumatic device, Manual therapy, and Re-evaluation  PLAN FOR NEXT SESSION: give HEP for UE stretches, how were band exercises, how is snow angel stretch - add to HEP,  STM to lateral trunk, cont and review MLD to L breast, how is compression bra? How was chip pack? ROM to L shoulder both PROM and AAROM, give HEP for this and progress prn, compression pump?    [employer], PT 04/08/2022, 9:08 AM

## 2022-04-08 NOTE — Patient Instructions (Signed)
Over Head Pull: Narrow and Wide Grip   Cancer Rehab [phone]   On back, knees bent, feet flat, band across thighs, elbows straight but relaxed. Pull hands apart (start). Keeping elbows straight, bring arms up and over head, hands toward floor. Keep pull steady on band. Hold momentarily. Return slowly, keeping pull steady, back to start. Then do same with a wider grip on the band (past shoulder width) Repeat _10__ times. Band color __yellow____   Side Pull: Double Arm   On back, knees bent, feet flat. Arms perpendicular to body, shoulder level, elbows straight but relaxed. Pull arms out to sides, elbows straight. Resistance band comes across collarbones, hands toward floor. Hold momentarily. Slowly return to starting position. Repeat _10__ times. Band color _yellow____   Sword   On back, knees bent, feet flat, left hand on left hip, right hand above left. Pull right arm DIAGONALLY (hip to shoulder) across chest. Bring right arm along head toward floor. Thumb is pointed down when by hip and then rotates backwards with thumb pointing towards floor when by head. Hold momentarily. Slowly return to starting position. Repeat _10__ times. Do with left arm. Band color _yellow_____   Shoulder Rotation: Double Arm   On back, knees bent, feet flat, elbows tucked at sides, bent 90, hands palms up. Pull hands apart and down toward floor, keeping elbows near sides. Hold momentarily. Slowly return to starting position. Repeat _10__ times. Band color __yellow____

## 2022-04-09 ENCOUNTER — Encounter: Payer: Self-pay | Admitting: Physical Therapy

## 2022-04-09 ENCOUNTER — Telehealth: Payer: Self-pay | Admitting: *Deleted

## 2022-04-09 ENCOUNTER — Ambulatory Visit: Payer: BC Managed Care – PPO | Admitting: Physical Therapy

## 2022-04-09 DIAGNOSIS — D0512 Intraductal carcinoma in situ of left breast: Secondary | ICD-10-CM

## 2022-04-09 DIAGNOSIS — I890 Lymphedema, not elsewhere classified: Secondary | ICD-10-CM | POA: Diagnosis not present

## 2022-04-09 DIAGNOSIS — M25612 Stiffness of left shoulder, not elsewhere classified: Secondary | ICD-10-CM

## 2022-04-09 DIAGNOSIS — R293 Abnormal posture: Secondary | ICD-10-CM

## 2022-04-09 NOTE — Telephone Encounter (Signed)
Pt called wanting to schedule f/u appt for CT scan results. Scheduled pt for 1/19 at 12:15 pm. Pt verbalized understanding

## 2022-04-09 NOTE — Therapy (Signed)
OUTPATIENT PHYSICAL THERAPY ONCOLOGY TREATMENT  Patient Name: Joan Dalton MRN: [id number] DOB:[birth year], 47 y.o., female Today's Date: 04/09/2022  END OF SESSION:  PT End of Session - 04/09/22 0804     Visit Number 13    Number of Visits 17    Date for PT Re-Evaluation 04/23/22    PT Start Time 0803    PT Stop Time 0856    PT Time Calculation (min) 53 min    Activity Tolerance Patient tolerated treatment well    Behavior During Therapy [facility] for tasks assessed/performed             Past Medical History:  Diagnosis Date   Anemia    Anxiety    Bilateral swelling of feet    Breast cancer ([place]) 05/08/2021   Constipation    Family history of breast cancer    Family history of colon cancer    Family history of pancreatic cancer    Fatty liver    Hx of blood clots    Hypertension    Joint pain    Joint pain    Lactose intolerance    Palpitations    Vitamin D deficiency    Past Surgical History:  Procedure Laterality Date   ABLATION     D&C Ablation of Uterus   AXILLARY SENTINEL NODE BIOPSY Left 06/17/2021   Procedure: LEFT AXILLARY SENTINEL NODE BIOPSY;  Surgeon: Jovita Kussmaul, MD;  Location: [place];  Service: General;  Laterality: Left;   BREAST BIOPSY Left 05/08/2021   BREAST LUMPECTOMY Right 10/1998   BREAST LUMPECTOMY WITH RADIOACTIVE SEED AND SENTINEL LYMPH NODE BIOPSY Left 06/17/2021   Procedure: LEFT BREAST LUMPECTOMY WITH RADIOACTIVE SEEDS x 2;  Surgeon: Jovita Kussmaul, MD;  Location: [place];  Service: General;  Laterality: Left;   BREAST REDUCTION SURGERY Bilateral 07/24/2021   Procedure: BILATERAL ONCOPLASTIC BREAST REDUCTION;  Surgeon: Wallace Going, DO;  Location: [place];  Service: Plastics;  Laterality: Bilateral;   MASTOPEXY Bilateral 07/24/2021   Procedure: MASTOPEXY;  Surgeon: Wallace Going, DO;  Location: [place];  Service: Plastics;  Laterality: Bilateral;   Patient Active Problem List   Diagnosis Date Noted   Postoperative  breast asymmetry 07/08/2021   Genetic testing 06/06/2021   Family history of breast cancer 05/24/2021   Family history of colon cancer 05/24/2021   Family history of pancreatic cancer 05/24/2021   Ductal carcinoma in situ (DCIS) of left breast 05/20/2021    PCP: Early Osmond, MD  REFERRING PROVIDER: Gardenia Phlegm, NP  REFERRING DIAG: [phone] (ICD-10-CM) - Intraductal carcinoma in situ of left breast   THERAPY DIAG:  Lymphedema, not elsewhere classified  Stiffness of left shoulder, not elsewhere classified  Abnormal posture  Intraductal carcinoma in situ of left breast  ONSET DATE: 12/22/21  Rationale for Evaluation and Treatment: Rehabilitation  SUBJECTIVE:  SUBJECTIVE STATEMENT: I am not having any pain today I guess because I was here yesterday.   PERTINENT HISTORY: Patient was diagnosed on 05/14/21 with L breast cancer. It measures 2.8 cm and is located in the upper outer quadrant. 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 06/17/21- L breast lumpectomy and SLNB 0/3, 07/24/21- bilateral oncoplastic breast reduction, Previous hx of  lumpectomy in 2000 on the R side for a nodule (not cancerous), hx of blood clot in 2021  PAIN:  Are you having pain? No  PRECAUTIONS: Other: L breast lymphedema  WEIGHT BEARING RESTRICTIONS: No  FALLS:  Has patient fallen in last 6 months? No  LIVING ENVIRONMENT: Lives with: lives with their family and lives with their spouse 5 children aged 45, 39, 27, 54, 41 Lives in: House/apartment Has following equipment at home:  None  OCCUPATION:  part time- grad Naval architect, full time Statistician in speech language pathology   LEISURE: pt recently got Pel[employer] fixed and plans to begin biking again  HAND DOMINANCE: right   PRIOR LEVEL OF FUNCTION:  Independent  PATIENT GOALS: to reduce the pain and tenderness in L breast and learn how to manage lymphedema   OBJECTIVE:  COGNITION: Overall cognitive status: Within functional limits for tasks assessed   PALPATION: Thickened/fibrotic edema in L breast  OBSERVATIONS / OTHER ASSESSMENTS: peau d orange skin of L breast, L breast larger than R  POSTURE:  Forward head and rounded shoulders posture   UPPER EXTREMITY AROM/PROM:   A/PROM RIGHT  05/16/2021    Shoulder extension 76  Shoulder flexion 153  Shoulder abduction 162  Shoulder internal rotation 63  Shoulder external rotation 75                          (Blank rows = not tested)   A/PROM LEFT  05/16/2021 LEFT 02/19/22 LEFT 03/11/22  Shoulder extension 66 62   Shoulder flexion 153 140 155  Shoulder abduction 156 112 146  Shoulder internal rotation 63 55   Shoulder external rotation 74 85                           (Blank rows = not tested       UPPER EXTREMITY STRENGTH: 5/5     LYMPHEDEMA ASSESSMENTS:    LANDMARK RIGHT  05/16/2021 RIGHT 02/19/22  10 cm proximal to olecranon process 38.6 37.5  Olecranon process '31 30  10 '$ cm proximal to ulnar styloid process 27 26  Just proximal to ulnar styloid process 19.7 20  Across hand at thumb web space 22 22  At base of 2nd digit 8 7.8  (Blank rows = not tested)   LANDMARK LEFT  05/16/2021 LEFT 02/19/22  10 cm proximal to olecranon process 38 38.5  Olecranon process '31 31  10 '$ cm proximal to ulnar styloid process 26.9 25.5  Just proximal to ulnar styloid process 20.7 19.4  Across hand at thumb web space 21.5 21.7  At base of 2nd digit 7.5 7.5  (Blank rows = not tested)  LYMPHEDEMA ASSESSMENTS:   SURGERY TYPE/DATE: 06/17/21- L br lumpectomy and SLNB; 07/24/21 - bilateral oncoplastic breast reduction; hx of R lumpectomy in 2000 - non cancerous  NUMBER OF LYMPH NODES REMOVED: 0/3  CHEMOTHERAPY: none  RADIATION:complted on 10/16/21  HORMONE TREATMENT: began 10/2021- on  Tamoxifen  INFECTIONS: none   FUNCTIONAL TESTS:  Breast Complaints Scale-  46     L-DEX  LYMPHEDEMA SCREENING:  The patient was assessed using the L-Dex machine today to produce a lymphedema index baseline score. The patient will be reassessed on a regular basis (typically every 3 months) to obtain new L-Dex scores. If the score is > 6.5 points away from his/her baseline score indicating onset of subclinical lymphedema, it will be recommended to wear a compression garment for 4 weeks, 12 hours per day and then be reassessed. If the score continues to be > 6.5 points from baseline at reassessment, we will initiate lymphedema treatment. Assessing in this manner has a 95% rate of preventing clinically significant lymphedema.     TODAY'S TREATMENT:                                                                                                                                         DATE:  04/09/22: Manual Therapy MLD In supine: Short neck, superficial and deep abdominals, Rt axillary nodes, Rt intact upper quadrant sequence and establishment of anterior inter-axillary pathway, Lt inguinal nodes and establishment of Lt axillo-inguinal pathway, then Lt breast moving fluid towards pathways spending extra time in area of fibrosis at medial breast then retracing all steps.  PROM in to flexion and abduction with some tightness in inferior breast STM to scar tissue in L inferior breast to decrease scar tissue and to SLNB scar  04/08/22: Manual Therapy MLD In supine: Short neck, superficial and deep abdominals, Rt axillary nodes, Rt intact upper quadrant sequence and establishment of anterior inter-axillary pathway, Lt inguinal nodes and establishment of Lt axillo-inguinal pathway, then Lt breast moving fluid towards pathways spending extra time in area of fibrosis at medial breast then retracing all steps.  PROM in to flexion and abduction with some tightness in inferior breast STM to scar tissue in  L inferior breast to decrease scar tissue  04/02/22: Manual Therapy MLD In supine: Short neck, superficial and deep abdominals, Rt axillary nodes, Rt intact upper quadrant sequence and establishment of anterior inter-axillary pathway, Lt inguinal nodes and establishment of Lt axillo-inguinal pathway, then Lt breast moving fluid towards pathways spending extra time in area of fibrosis at medial breast then retracing all steps.  PROM to Lt shoulder with scapular depression throughout by therapist into flexion, abd and D2 to pts available end motions STM to scar tissue in Lt axilla during P/ROM  03/31/22: In supine: Short neck, 5 diaphragmatic breaths, R axillary nodes and establishment of interaxillary pathway, L inguinal nodes and establishment of axilloinguinal pathway, then L breast moving fluid towards pathways spending extra time in any areas of fibrosis then retracing all steps.  PROM to L shoulder with v/c to keep shoulder relaxed with pt feeling increased tightness in L axilla Instructed pt in supine scapular strengthening exercises with yellow band x 10 reps each with pt returning therapist demo, therapist gave v/c and t/c for overhead flexion narrow and wide grip, diagonals, ER  and horizontal abduction  03/28/22: In supine: Short neck, 5 diaphragmatic breaths, R axillary nodes and establishment of interaxillary pathway, L inguinal nodes and establishment of axilloinguinal pathway, then L breast moving fluid towards pathways spending extra time in any areas of fibrosis then retracing all steps.   03/26/22: Manual Therapy Reassessed goals for renewal In supine: Short neck, 5 diaphragmatic breaths, Rt axillary nodes and establishment of anterior inter-axillary pathway, L tinguinal nodes and establishment of Lt axillo-inguinal pathway, then Lt breast moving fluid towards pathways, then briefly into Rt S/L to teach pt how to better reach lateral aspect of breast and Lt axillo-inguinal anastomosis;  then finished retracing all steps in supine spending extra time in any areas of fibrosis reviewing with pt throughout correct pressure and to only stretch skin, not slide, with hand over hand pressure.  P/ROM to Lt shoulder into flexion and abd briefly with scapular depression. STM to Lt axilla over scar tissue and where pt reports tenderness to touch that improved     PATIENT EDUCATION:  Education details: anatomy and physiology of the lymphatic system, need for compression bra, chip pack, basic principles of MLD Person educated: Patient Education method: Explanation Education comprehension: verbalized understanding  HOME EXERCISE PROGRAM: Wear compression bra as much as possible  ASSESSMENT:  CLINICAL IMPRESSION: Pt reports she has been doing her band exercises. Continued with MLD to L breast. She is waiting to talk to her doctor regarding her results from her recent CT scan looking at the area of firmness in L lateral trunk just inferior to her breast. Her tightness at end range of motion is improving and following therapy is much better.   OBJECTIVE IMPAIRMENTS: decreased knowledge of condition, decreased knowledge of use of DME, decreased ROM, decreased strength, increased edema, increased fascial restrictions, impaired UE functional use, postural dysfunction, and pain.   ACTIVITY LIMITATIONS: sleeping and reach over head  PARTICIPATION LIMITATIONS:  none  PERSONAL FACTORS: Time since onset of injury/illness/exacerbation are also affecting patient's functional outcome.   REHAB POTENTIAL: Good  CLINICAL DECISION MAKING: Stable/uncomplicated  EVALUATION COMPLEXITY: Low  GOALS: Goals reviewed with patient? Yes  SHORT TERM GOALS=LONG TERM GOALS Target date: 03/19/22  Pt will be independent in self MLD for long term management of lymphedema.  Baseline: no knowledge; Pt reports her and her husband are managing but will benefit from further review - 03/26/22 Goal status:  Progress towards goal  2.  Pt will demonstrate decreased pore size and improved skin mobility in L breast to allow improved comfort.  Baseline: Pores are still present though are less noticeable, and skin is more pliable - 03/26/22 Goal status: Progress towards goal  3.  Pt will demonstrate 150 degrees of L shoulder abduction to allow her to reach out to the side.  Baseline: 112; 142 degrees - 03/26/22 Goal status:Progress towards goal  4.  Pt will be independent in a home exericse program for continued stretching and strengthening.  Baseline: Pt is working towards independence with initial HEP, we will be switching focus  Goal status: Progress towards goals   PLAN:  PT FREQUENCY: 2x/week  PT DURATION: 4 weeks  PLANNED INTERVENTIONS: Therapeutic exercises, Therapeutic activity, Patient/Family education, Self Care, Joint mobilization, Orthotic/Fit training, Manual lymph drainage, Compression bandaging, scar mobilization, Taping, Vasopneumatic device, Manual therapy, and Re-evaluation  PLAN FOR NEXT SESSION: STM to lateral trunk, cont and review MLD to L breast, how is compression bra? How was chip pack? ROM to L shoulder both PROM and AAROM, give HEP  for this and progress prn, compression pump?    [employer], PT 04/09/2022, 10:01 AM

## 2022-04-10 ENCOUNTER — Ambulatory Visit (INDEPENDENT_AMBULATORY_CARE_PROVIDER_SITE_OTHER): Payer: BC Managed Care – PPO | Admitting: Family Medicine

## 2022-04-10 ENCOUNTER — Encounter (INDEPENDENT_AMBULATORY_CARE_PROVIDER_SITE_OTHER): Payer: Self-pay | Admitting: Family Medicine

## 2022-04-10 VITALS — BP 121/83 | HR 75 | Temp 98.2°F | Ht 66.0 in | Wt 259.0 lb

## 2022-04-10 DIAGNOSIS — Z6841 Body Mass Index (BMI) 40.0 and over, adult: Secondary | ICD-10-CM | POA: Diagnosis not present

## 2022-04-10 DIAGNOSIS — E669 Obesity, unspecified: Secondary | ICD-10-CM

## 2022-04-10 DIAGNOSIS — E7849 Other hyperlipidemia: Secondary | ICD-10-CM | POA: Diagnosis not present

## 2022-04-10 DIAGNOSIS — E559 Vitamin D deficiency, unspecified: Secondary | ICD-10-CM | POA: Diagnosis not present

## 2022-04-10 MED ORDER — VITAMIN D (ERGOCALCIFEROL) 1.25 MG (50000 UNIT) PO CAPS: 50000.0000 [IU] | ORAL_CAPSULE | ORAL | 0 refills | Status: DC

## 2022-04-11 ENCOUNTER — Inpatient Hospital Stay (HOSPITAL_BASED_OUTPATIENT_CLINIC_OR_DEPARTMENT_OTHER): Payer: BC Managed Care – PPO | Admitting: Adult Health

## 2022-04-11 ENCOUNTER — Encounter: Payer: Self-pay | Admitting: Adult Health

## 2022-04-11 ENCOUNTER — Other Ambulatory Visit: Payer: Self-pay

## 2022-04-11 VITALS — BP 126/87 | HR 106 | Temp 98.2°F | Resp 16 | Ht 66.0 in | Wt 266.9 lb

## 2022-04-11 DIAGNOSIS — R590 Localized enlarged lymph nodes: Secondary | ICD-10-CM

## 2022-04-11 DIAGNOSIS — D0512 Intraductal carcinoma in situ of left breast: Secondary | ICD-10-CM

## 2022-04-11 NOTE — Progress Notes (Signed)
[facility] Follow up:    [name], [name], MD [address] [place] [address]   DIAGNOSIS:  Cancer Staging  Ductal carcinoma in situ (DCIS) of left breast Staging form: Breast, AJCC 8th Edition - Clinical stage from 06/17/2021: Stage 0 (cTis (DCIS), cN0, cM0, ER+, PR+) - Signed by Gardenia Phlegm, NP on 01/30/2022   SUMMARY OF ONCOLOGIC HISTORY: Oncology History  Ductal carcinoma in situ (DCIS) of left breast  05/20/2021 Initial Diagnosis   Ductal carcinoma in situ (DCIS) of left breast   06/05/2021 Genetic Testing   Negative genetic testing on the CancerNext-Expanded+RNAinsight panel.  The report date is June 05, 2021.  The CancerNext-Expanded gene panel offered by [facility] and includes sequencing and rearrangement analysis for the following 77 genes: AIP, ALK, APC*, ATM*, AXIN2, BAP1, BARD1, BLM, BMPR1A, BRCA1*, BRCA2*, BRIP1*, CDC73, CDH1*, CDK4, CDKN1B, CDKN2A, CHEK2*, CTNNA1, DICER1, FANCC, FH, FLCN, GALNT12, KIF1B, LZTR1, MAX, MEN1, MET, MLH1*, MSH2*, MSH3, MSH6*, MUTYH*, NBN, NF1*, NF2, NTHL1, PALB2*, PHOX2B, PMS2*, POT1, PRKAR1A, PTCH1, PTEN*, RAD51C*, RAD51D*, RB1, RECQL, RET, SDHA, SDHAF2, SDHB, SDHC, SDHD, SMAD4, SMARCA4, SMARCB1, SMARCE1, STK11, SUFU, TMEM127, TP53*, TSC1, TSC2, VHL and XRCC2 (sequencing and deletion/duplication); EGFR, EGLN1, HOXB13, KIT, MITF, PDGFRA, POLD1, and POLE (sequencing only); EPCAM and GREM1 (deletion/duplication only). DNA and RNA analyses performed for * genes.    06/17/2021 Surgery   Patient had left breast lumpectomy which showed high-grade ductal carcinoma in situ with necrosis and calcifications, resection margins are negative for carcinoma left additional superior margin excision again negative for carcinoma.  She had 3 out of 3 lymph nodes without involvement.  Prior prognostic showed ER 85% positive strong staining PR 95% positive strong staining. She is now undergoing plastic surgery, had  right and left breast mammoplasty with no evidence of malignancy.   06/17/2021 Cancer Staging   Staging form: Breast, AJCC 8th Edition - Clinical stage from 06/17/2021: Stage 0 (cTis (DCIS), cN0, cM0, ER+, PR+) - Signed by Gardenia Phlegm, NP on 01/30/2022   08/28/2021 - 10/16/2021 Radiation Therapy   Site Technique Total Dose (Gy) Dose per Fx (Gy) Completed Fx Beam Energies  Breast, Left: Breast_L 3D 50.4/50.4 1.8 28/28 10XFFF  Breast, Left: Breast_L_Bst 3D 10/10 2 5/5 6X, 10X     10/2021 -  Anti-estrogen oral therapy   Tamoxifen     CURRENT THERAPY: tamoxifen  INTERVAL HISTORY: Joan Dalton 47 y.o. female returns for follow-up of her history of breast cancer.  She continues on tamoxifen and is experiencing hot flashes and decreased libido.  She wonders if there is anything she can do for these 2 issues.  She also wants to review her CT chest abdomen pelvis that was completed April 07, 2022 which showed small bilateral axillary lymph nodes in the left axilla right axillary lymph node of 1.4 x 1 cm no evidence of soft tissue metastatic disease in chest abdomen or pelvis a small 0.8 cm spleen nodule likely a splenule.  And calcified uterine fibroids.  Patient Active Problem List   Diagnosis Date Noted   Postoperative breast asymmetry 07/08/2021   Genetic testing 06/06/2021   Family history of breast cancer 05/24/2021   Family history of colon cancer 05/24/2021   Family history of pancreatic cancer 05/24/2021   Ductal carcinoma in situ (DCIS) of left breast 05/20/2021    is allergic to sulfamethoxazole-trimethoprim.  MEDICAL HISTORY: Past Medical History:  Diagnosis Date   Anemia    Anxiety  Bilateral swelling of feet    Breast cancer ([place]) 05/08/2021   Constipation    Family history of breast cancer    Family history of colon cancer    Family history of pancreatic cancer    Fatty liver    Hx of blood clots    Hypertension    Joint pain    Joint pain     Lactose intolerance    Palpitations    Vitamin D deficiency     SURGICAL HISTORY: Past Surgical History:  Procedure Laterality Date   ABLATION     D&C Ablation of Uterus   AXILLARY SENTINEL NODE BIOPSY Left 06/17/2021   Procedure: LEFT AXILLARY SENTINEL NODE BIOPSY;  Surgeon: Jovita Kussmaul, MD;  Location: [place];  Service: General;  Laterality: Left;   BREAST BIOPSY Left 05/08/2021   BREAST LUMPECTOMY Right 10/1998   BREAST LUMPECTOMY WITH RADIOACTIVE SEED AND SENTINEL LYMPH NODE BIOPSY Left 06/17/2021   Procedure: LEFT BREAST LUMPECTOMY WITH RADIOACTIVE SEEDS x 2;  Surgeon: Jovita Kussmaul, MD;  Location: [place];  Service: General;  Laterality: Left;   BREAST REDUCTION SURGERY Bilateral 07/24/2021   Procedure: BILATERAL ONCOPLASTIC BREAST REDUCTION;  Surgeon: Wallace Going, DO;  Location: [place];  Service: Plastics;  Laterality: Bilateral;   MASTOPEXY Bilateral 07/24/2021   Procedure: MASTOPEXY;  Surgeon: Wallace Going, DO;  Location: [place];  Service: Plastics;  Laterality: Bilateral;    SOCIAL HISTORY: Social History   Socioeconomic History   Marital status: Married    Spouse name: CHANDREA ZELLMAN II   Number of children: 5   Years of education: Not on file   Highest education level: Not on file  Occupational History   Not on file  Tobacco Use   Smoking status: Never   Smokeless tobacco: Never  Vaping Use   Vaping Use: Never used  Substance and Sexual Activity   Alcohol use: Never   Drug use: Never   Sexual activity: Yes  Other Topics Concern   Not on file  Social History Narrative   Not on file   Social Determinants of Health   Financial Resource Strain: Not on file  Food Insecurity: Not on file  Transportation Needs: Not on file  Physical Activity: Not on file  Stress: Not on file  Social Connections: Not on file  Intimate Partner Violence: Not At Risk (05/21/2021)   Humiliation, Afraid, Rape, and Kick questionnaire    Fear of Current or  Ex-Partner: No    Emotionally Abused: No    Physically Abused: No    Sexually Abused: No    FAMILY HISTORY: Family History  Problem Relation Age of Onset   Breast cancer Mother        46s & 30s   Hypertension Father    Hyperlipidemia Father    Diabetes Father    Prostate cancer Father    Heart disease Father    Sleep apnea Father    Obesity Father    Colon cancer Maternal Grandfather    Breast cancer Maternal Aunt 75   Pancreatic cancer Maternal Aunt 75   Breast cancer Maternal Aunt        later 30s-early 69s   Colon cancer Maternal Aunt 42   Colon cancer Maternal Uncle        60s   Pancreatic cancer Cousin        mother's maternal first cousin   Pancreatic cancer Cousin        mother's maternal  first cousin    Review of Systems  Constitutional:  Negative for appetite change, chills, fatigue, fever and unexpected weight change.  HENT:   Negative for hearing loss, lump/mass and trouble swallowing.   Eyes:  Negative for eye problems and icterus.  Respiratory:  Negative for chest tightness, cough and shortness of breath.   Cardiovascular:  Negative for chest pain, leg swelling and palpitations.  Gastrointestinal:  Negative for abdominal distention, abdominal pain, constipation, diarrhea, nausea and vomiting.  Endocrine: Negative for hot flashes.  Genitourinary:  Negative for difficulty urinating.   Musculoskeletal:  Negative for arthralgias.  Skin:  Negative for itching and rash.  Neurological:  Negative for dizziness, extremity weakness, headaches and numbness.  Hematological:  Negative for adenopathy. Does not bruise/bleed easily.  Psychiatric/Behavioral:  Negative for depression. The patient is not nervous/anxious.       PHYSICAL EXAMINATION  ECOG PERFORMANCE STATUS: 1 - Symptomatic but completely ambulatory  Vitals:   04/11/22 1210  BP: 126/87  Pulse: (!) 106  Resp: 16  Temp: 98.2 F (36.8 C)  SpO2: 98%  Patient appears well.  She is in no apparent  distress.  Mood and behavior are normal.  HR 95    LABORATORY DATA:  None for this visit   ASSESSMENT and THERAPY PLAN:   Ductal carcinoma in situ (DCIS) of left breast Joan Dalton and has a history of stage 0 breast cancer diagnosed in March 2023 status postlumpectomy, adjuvant radiation, and antiestrogen therapy with tamoxifen which began in August 2023.    We reviewed that she has no radiographic signs of breast cancer recurrence.  She is undergoing mammogram and ultrasound on Monday, January 22.  I added a right axillary ultrasound to this since her CT scan noted a 1.4 cm axillary node which is slightly enlarged.  We reviewed that the splenial can be followed by repeat CT scan in about 6 months however we have very low suspicion that this is cancer since she had noninvasive breast cancer.  We discussed her hot flashes and decreased libido.  She is not at a point where she wants to take anything for the hot flashes.  We did discuss her decreased libido and I suggested coconut oil as a lubricant in addition to genital stimulation can sometimes get patients in the mood.  Joan Dalton and will follow-up with [place] in May 2024 she knows to call for any questions or concerns in the meantime.   All questions were answered. The patient knows to call the clinic with any problems, questions or concerns. We can certainly see the patient much sooner if necessary.  Total encounter time:30 minutes*in face-to-face visit time, chart review, lab review, care coordination, order entry, and documentation of the encounter time.  [name], NP 04/11/22 12:48 PM Medical Oncology and Hematology [facility] [place], [place] [address] Tel. [phone]    Fax. [phone]  *Total Encounter Time as defined by the Centers for Medicare and Medicaid Services includes, in addition to the face-to-face time of a patient visit (documented in the note above) non-face-to-face time:  obtaining and reviewing outside history, ordering and reviewing medications, tests or procedures, care coordination (communications with other health care professionals or caregivers) and documentation in the medical record.

## 2022-04-11 NOTE — Assessment & Plan Note (Signed)
Kianni and has a history of stage 0 breast cancer diagnosed in March 2023 status postlumpectomy, adjuvant radiation, and antiestrogen therapy with tamoxifen which began in August 2023.    We reviewed that she has no radiographic signs of breast cancer recurrence.  She is undergoing mammogram and ultrasound on Monday, January 22.  I added a right axillary ultrasound to this since her CT scan noted a 1.4 cm axillary node which is slightly enlarged.  We reviewed that the splenial can be followed by repeat CT scan in about 6 months however we have very low suspicion that this is cancer since she had noninvasive breast cancer.  We discussed her hot flashes and decreased libido.  She is not at a point where she wants to take anything for the hot flashes.  We did discuss her decreased libido and I suggested coconut oil as a lubricant in addition to genital stimulation can sometimes get patients in the mood.  Jovon and will follow-up with [place] in May 2024 she knows to call for any questions or concerns in the meantime.

## 2022-04-14 ENCOUNTER — Ambulatory Visit
Admission: RE | Admit: 2022-04-14 | Discharge: 2022-04-14 | Disposition: A | Discharge status: Home / Self Care | Payer: BC Managed Care – PPO | Source: Ambulatory Visit | Attending: Adult Health | Admitting: Adult Health

## 2022-04-14 ENCOUNTER — Ambulatory Visit: Payer: BC Managed Care – PPO | Admitting: Physical Therapy

## 2022-04-14 ENCOUNTER — Ambulatory Visit: Payer: BC Managed Care – PPO

## 2022-04-14 DIAGNOSIS — D0512 Intraductal carcinoma in situ of left breast: Secondary | ICD-10-CM

## 2022-04-14 HISTORY — DX: Personal history of irradiation: Z92.3

## 2022-04-16 ENCOUNTER — Ambulatory Visit: Payer: BC Managed Care – PPO

## 2022-04-16 DIAGNOSIS — M25612 Stiffness of left shoulder, not elsewhere classified: Secondary | ICD-10-CM

## 2022-04-16 DIAGNOSIS — I890 Lymphedema, not elsewhere classified: Secondary | ICD-10-CM | POA: Diagnosis not present

## 2022-04-16 DIAGNOSIS — D0512 Intraductal carcinoma in situ of left breast: Secondary | ICD-10-CM

## 2022-04-16 DIAGNOSIS — R293 Abnormal posture: Secondary | ICD-10-CM

## 2022-04-16 NOTE — Therapy (Signed)
OUTPATIENT PHYSICAL THERAPY ONCOLOGY TREATMENT  Patient Name: Joan Dalton MRN: [id number] DOB:Jan 16, 1976, 47 y.o., female Today's Date: 04/16/2022  END OF SESSION:  PT End of Session - 04/16/22 0808     Visit Number 14    Number of Visits 17    Date for PT Re-Evaluation 04/23/22    PT Start Time 0805    PT Stop Time 1856    PT Time Calculation (min) 53 min    Activity Tolerance Patient tolerated treatment well    Behavior During Therapy [facility] for tasks assessed/performed             Past Medical History:  Diagnosis Date   Anemia    Anxiety    Bilateral swelling of feet    Breast cancer ([place]) 05/08/2021   Constipation    Family history of breast cancer    Family history of colon cancer    Family history of pancreatic cancer    Fatty liver    Hx of blood clots    Hypertension    Joint pain    Joint pain    Lactose intolerance    Palpitations    Personal history of radiation therapy    Vitamin D deficiency    Past Surgical History:  Procedure Laterality Date   ABLATION     D&C Ablation of Uterus   AXILLARY SENTINEL NODE BIOPSY Left 06/17/2021   Procedure: LEFT AXILLARY SENTINEL NODE BIOPSY;  Surgeon: Jovita Kussmaul, MD;  Location: [place];  Service: General;  Laterality: Left;   BREAST BIOPSY Left 05/08/2021   BREAST LUMPECTOMY Right 10/1998   BREAST LUMPECTOMY WITH RADIOACTIVE SEED AND SENTINEL LYMPH NODE BIOPSY Left 06/17/2021   Procedure: LEFT BREAST LUMPECTOMY WITH RADIOACTIVE SEEDS x 2;  Surgeon: Jovita Kussmaul, MD;  Location: [place];  Service: General;  Laterality: Left;   BREAST REDUCTION SURGERY Bilateral 07/24/2021   Procedure: BILATERAL ONCOPLASTIC BREAST REDUCTION;  Surgeon: Wallace Going, DO;  Location: [place];  Service: Plastics;  Laterality: Bilateral;   MASTOPEXY Bilateral 07/24/2021   Procedure: MASTOPEXY;  Surgeon: Wallace Going, DO;  Location: [place];  Service: Plastics;  Laterality: Bilateral;   Patient Active Problem List    Diagnosis Date Noted   Postoperative breast asymmetry 07/08/2021   Genetic testing 06/06/2021   Family history of breast cancer 05/24/2021   Family history of colon cancer 05/24/2021   Family history of pancreatic cancer 05/24/2021   Ductal carcinoma in situ (DCIS) of left breast 05/20/2021    PCP: Early Osmond, MD  REFERRING PROVIDER: Gardenia Phlegm, NP  REFERRING DIAG: [phone] (ICD-10-CM) - Intraductal carcinoma in situ of left breast   THERAPY DIAG:  Lymphedema, not elsewhere classified  Stiffness of left shoulder, not elsewhere classified  Abnormal posture  Intraductal carcinoma in situ of left breast  ONSET DATE: 12/22/21  Rationale for Evaluation and Treatment: Rehabilitation  SUBJECTIVE:  SUBJECTIVE STATEMENT: My Lt breast is starting to calm down and feel like it's supposed to. I haven't had to use the foam Blaire gave me lately.   PERTINENT HISTORY: Patient was diagnosed on 05/14/21 with L breast cancer. It measures 2.8 cm and is located in the upper outer quadrant. 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 06/17/21- L breast lumpectomy and SLNB 0/3, 07/24/21- bilateral oncoplastic breast reduction, Previous hx of  lumpectomy in 2000 on the R side for a nodule (not cancerous), hx of blood clot in 2021  PAIN:  Are you having pain? No  PRECAUTIONS: Other: L breast lymphedema  WEIGHT BEARING RESTRICTIONS: No  FALLS:  Has patient fallen in last 6 months? 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, full time Statistician in speech language pathology   LEISURE: pt recently got [employer] fixed and plans to  begin biking again  HAND DOMINANCE: right   PRIOR LEVEL OF FUNCTION: Independent  PATIENT GOALS: to reduce the pain and tenderness in L breast and learn how to manage lymphedema   OBJECTIVE:  COGNITION: Overall cognitive status: Within functional limits for tasks assessed   PALPATION: Thickened/fibrotic edema in L breast  OBSERVATIONS / OTHER ASSESSMENTS: peau d orange skin of L breast, L breast larger than R  POSTURE:  Forward head and rounded shoulders posture   UPPER EXTREMITY AROM/PROM:   A/PROM RIGHT  05/16/2021    Shoulder extension 76  Shoulder flexion 153  Shoulder abduction 162  Shoulder internal rotation 63  Shoulder external rotation 75                          (Blank rows = not tested)   A/PROM LEFT  05/16/2021 LEFT 02/19/22 LEFT 03/11/22  Shoulder extension 66 62   Shoulder flexion 153 140 155  Shoulder abduction 156 112 146  Shoulder internal rotation 63 55   Shoulder external rotation 74 85                           (Blank rows = not tested       UPPER EXTREMITY STRENGTH: 5/5     LYMPHEDEMA ASSESSMENTS:    LANDMARK RIGHT  05/16/2021 RIGHT 02/19/22  10 cm proximal to olecranon process 38.6 37.5  Olecranon process '31 30  10 '$ cm proximal to ulnar styloid process 27 26  Just proximal to ulnar styloid process 19.7 20  Across hand at thumb web space 22 22  At base of 2nd digit 8 7.8  (Blank rows = not tested)   LANDMARK LEFT  05/16/2021 LEFT 02/19/22  10 cm proximal to olecranon process 38 38.5  Olecranon process '31 31  10 '$ cm proximal to ulnar styloid process 26.9 25.5  Just proximal to ulnar styloid process 20.7 19.4  Across hand at thumb web space 21.5 21.7  At base of 2nd digit 7.5 7.5  (Blank rows = not tested)  LYMPHEDEMA ASSESSMENTS:   SURGERY TYPE/DATE: 06/17/21- L br lumpectomy and SLNB; 07/24/21 - bilateral oncoplastic breast reduction; hx of R lumpectomy in 2000 - non cancerous  NUMBER OF LYMPH NODES REMOVED: 0/3  CHEMOTHERAPY:  none  RADIATION:complted on 10/16/21  HORMONE TREATMENT: began 10/2021- on Tamoxifen  INFECTIONS: none   FUNCTIONAL TESTS:  Breast Complaints Scale-  46     L-DEX LYMPHEDEMA SCREENING:  The patient was assessed using the L-Dex machine today to produce a lymphedema index baseline score. The patient will be reassessed on a regular basis (typically every 3 months) to obtain new L-Dex scores. If the score is > 6.5 points away from his/her baseline score indicating onset of subclinical lymphedema, it will be recommended to wear a compression garment for 4 weeks, 12 hours per day and then be reassessed. If the score continues to be > 6.5 points from baseline at reassessment, we will initiate lymphedema treatment. Assessing in this manner has a 95% rate of preventing clinically significant lymphedema.     TODAY'S TREATMENT:                                                                                                                                         DATE:  04/16/22: Manual Therapy MLD In supine: Short neck, superficial and deep abdominals, Rt axillary nodes, Rt intact upper quadrant sequence and establishment of anterior inter-axillary pathway, Lt inguinal nodes and establishment of Lt axillo-inguinal pathway, then Lt breast moving fluid towards pathways spending extra time in area of fibrosis at medial breast then retracing all steps.  PROM of Lt shoulder in to flexion and abduction with some tightness in inferior breast STM to scar tissue in L inferior breast to decrease scar tissue and to SLNB scar/axillary area of tightness  04/09/22: Manual Therapy MLD In supine: Short neck, superficial and deep abdominals, Rt axillary nodes, Rt intact upper quadrant sequence and establishment of anterior inter-axillary pathway, Lt inguinal nodes and establishment of Lt axillo-inguinal pathway, then Lt breast moving fluid towards pathways spending extra time in area of fibrosis at medial breast then  retracing all steps.  PROM in to flexion and abduction with some tightness in inferior breast STM to scar tissue in L inferior breast to decrease scar tissue and to SLNB scar  04/08/22: Manual Therapy MLD In supine: Short neck, superficial and deep abdominals, Rt axillary nodes, Rt intact upper quadrant sequence and establishment of anterior inter-axillary pathway, Lt inguinal nodes and establishment of Lt axillo-inguinal pathway, then Lt breast moving fluid towards pathways spending extra time in area of fibrosis at medial breast then retracing all steps.  PROM in to flexion and abduction with some tightness in inferior breast STM to scar tissue in L inferior breast to decrease scar tissue     PATIENT EDUCATION:  Education details: anatomy and physiology of the lymphatic system, need for compression bra, chip pack, basic principles of MLD Person educated: Patient Education method: Explanation Education comprehension: verbalized understanding  HOME EXERCISE PROGRAM: Wear compression bra as much as possible  ASSESSMENT:  CLINICAL IMPRESSION: Pt reports she has recently really felt her Lt breast improving. She doesn't feel the need to wear the foam as often and can tell her breast isn't as  uncomfortable as it was before. The tightness is still present at her Lt axilla but this does soften well with STM. She feels she will be ready for D/C per POC next week.   OBJECTIVE IMPAIRMENTS: decreased knowledge of condition, decreased knowledge of use of DME, decreased ROM, decreased strength, increased edema, increased fascial restrictions, impaired UE functional use, postural dysfunction, and pain.   ACTIVITY LIMITATIONS: sleeping and reach over head  PARTICIPATION LIMITATIONS:  none  PERSONAL FACTORS: Time since onset of injury/illness/exacerbation are also affecting patient's functional outcome.   REHAB POTENTIAL: Good  CLINICAL DECISION MAKING: Stable/uncomplicated  EVALUATION  COMPLEXITY: Low  GOALS: Goals reviewed with patient? Yes  SHORT TERM GOALS=LONG TERM GOALS Target date: 03/19/22  Pt will be independent in self MLD for long term management of lymphedema.  Baseline: no knowledge; Pt reports her and her husband are managing but will benefit from further review - 03/26/22 Goal status: Progress towards goal  2.  Pt will demonstrate decreased pore size and improved skin mobility in L breast to allow improved comfort.  Baseline: Pores are still present though are less noticeable, and skin is more pliable - 03/26/22 Goal status: Progress towards goal  3.  Pt will demonstrate 150 degrees of L shoulder abduction to allow her to reach out to the side.  Baseline: 112; 142 degrees - 03/26/22 Goal status:Progress towards goal  4.  Pt will be independent in a home exericse program for continued stretching and strengthening.  Baseline: Pt is working towards independence with initial HEP, we will be switching focus  Goal status: Progress towards goals   PLAN:  PT FREQUENCY: 2x/week  PT DURATION: 4 weeks  PLANNED INTERVENTIONS: Therapeutic exercises, Therapeutic activity, Patient/Family education, Self Care, Joint mobilization, Orthotic/Fit training, Manual lymph drainage, Compression bandaging, scar mobilization, Taping, Vasopneumatic device, Manual therapy, and Re-evaluation  PLAN FOR NEXT SESSION: STM to lateral trunk, cont and review MLD to L breast,  ROM to L shoulder both PROM and AAROM, give HEP for this and progress prn, compression pump?   Collie Siad, PTA 04/16/22 8:58 AM

## 2022-04-17 NOTE — Progress Notes (Signed)
Chief Complaint:   OBESITY Joan Dalton is here to discuss her progress with her obesity treatment plan along with follow-up of her obesity related diagnoses. Joan Dalton is on the [employer] and states she is following her eating plan approximately 60-70% of the time. Joan Dalton states she is spin biking/cardio 30 minutes 2 times per week.  Today's visit was #: 6 Starting weight: 275 lbs Starting date: 11/05/2021 Today's weight: 259 lbs Today's date: 04/10/2022 Total lbs lost to date: 16 lbs Total lbs lost since last in-office visit: 4  Interim History: Joan Dalton nutrition wise for holidays was not as compliant to plan as she was previously.  Increased water intake and is trying to focus on protein intake daily.  Trying to get organized as the semester gets back in session.  Subjective:   1. Vitamin D deficiency Joan Dalton is currently taking prescription Vit D 50,000 IU once a week.  Denies any nausea, vomiting or muscle weakness.    She notes fatigue.  2. Other hyperlipidemia LDL elevated on initial labs.  Not on medications.  Assessment/Plan:   1. Vitamin D deficiency We will refill Vit D 50K IU once a week for 1 month with 0 refills.  Refill Vitamin D, Ergocalciferol, (DRISDOL) 1.25 MG (50000 UNIT) CAPS capsule; Take 1 capsule (50,000 Units total) by mouth every 7 (seven) days.  Dispense: 4 capsule; Refill: 0  2. Other hyperlipidemia Labs with PCP next month.  3. Obesity with current BMI of 41.8 Matrice is currently in the action stage of change. As such, her goal is to continue with weight loss efforts. She has agreed to the [employer].   Exercise goals: All adults should avoid inactivity. Some physical activity is better than none, and adults who participate in any amount of physical activity gain some health benefits.  Behavioral modification strategies: increasing lean protein intake, meal planning and cooking strategies, keeping healthy foods in the  home, and planning for success.  Joan Dalton has agreed to follow-up with our clinic in 3 weeks. She was informed of the importance of frequent follow-up visits to maximize her success with intensive lifestyle modifications for her multiple health conditions.   Objective:   Blood pressure 121/83, pulse 75, temperature 98.2 F (36.8 C), height '5\' 6"'$  (1.676 m), weight 259 lb (117.5 kg), SpO2 100 %. Body mass index is 41.8 kg/m.  General: Cooperative, alert, well developed, in no acute distress. HEENT: Conjunctivae and lids unremarkable. Cardiovascular: Regular rhythm.  Lungs: Normal work of breathing. Neurologic: No focal deficits.   Lab Results  Component Value Date   CREATININE 0.68 11/05/2021   BUN 8 11/05/2021   NA 138 11/05/2021   K 4.6 11/05/2021   CL 100 11/05/2021   CO2 21 11/05/2021   Lab Results  Component Value Date   ALT 17 11/05/2021   AST 13 11/05/2021   ALKPHOS 68 11/05/2021   BILITOT 0.3 11/05/2021   Lab Results  Component Value Date   HGBA1C 5.6 11/05/2021   Lab Results  Component Value Date   INSULIN 19.4 11/05/2021   Lab Results  Component Value Date   TSH 1.080 11/05/2021   Lab Results  Component Value Date   CHOL 224 (H) 11/05/2021   HDL 74 11/05/2021   LDLCALC 137 (H) 11/05/2021   TRIG 76 11/05/2021   Lab Results  Component Value Date   VD25OH 17.9 (L) 11/05/2021   Lab Results  Component Value Date   WBC 5.0 11/05/2021   HGB 13.3 11/05/2021  HCT 40.0 11/05/2021   MCV 95 11/05/2021   PLT 266 11/05/2021   No results found for: "IRON", "TIBC", "FERRITIN"  Attestation Statements:   Reviewed by clinician on day of visit: allergies, medications, problem list, medical history, surgical history, family history, social history, and previous encounter notes.  I, Elnora Morrison, RMA am acting as transcriptionist for Coralie Common, MD.  I have reviewed the above documentation for accuracy and completeness, and I agree with the above.  - Coralie Common, MD

## 2022-04-21 ENCOUNTER — Encounter: Payer: Self-pay | Admitting: Physical Therapy

## 2022-04-21 ENCOUNTER — Ambulatory Visit: Payer: BC Managed Care – PPO | Admitting: Physical Therapy

## 2022-04-21 DIAGNOSIS — D0512 Intraductal carcinoma in situ of left breast: Secondary | ICD-10-CM

## 2022-04-21 DIAGNOSIS — I890 Lymphedema, not elsewhere classified: Secondary | ICD-10-CM | POA: Diagnosis not present

## 2022-04-21 DIAGNOSIS — R293 Abnormal posture: Secondary | ICD-10-CM

## 2022-04-21 DIAGNOSIS — M25612 Stiffness of left shoulder, not elsewhere classified: Secondary | ICD-10-CM

## 2022-04-21 NOTE — Therapy (Signed)
OUTPATIENT PHYSICAL THERAPY ONCOLOGY TREATMENT  Patient Name: Joan Dalton MRN: [id number] DOB:1975/08/18, 47 y.o., female Today's Date: 04/21/2022  END OF SESSION:  PT End of Session - 04/21/22 0811     Visit Number 15    Number of Visits 17    Date for PT Re-Evaluation 04/23/22    PT Start Time 0809    PT Stop Time 0856    PT Time Calculation (min) 47 min    Activity Tolerance Patient tolerated treatment well    Behavior During Therapy [facility] for tasks assessed/performed             Past Medical History:  Diagnosis Date   Anemia    Anxiety    Bilateral swelling of feet    Breast cancer ([place]) 05/08/2021   Constipation    Family history of breast cancer    Family history of colon cancer    Family history of pancreatic cancer    Fatty liver    Hx of blood clots    Hypertension    Joint pain    Joint pain    Lactose intolerance    Palpitations    Personal history of radiation therapy    Vitamin D deficiency    Past Surgical History:  Procedure Laterality Date   ABLATION     D&C Ablation of Uterus   AXILLARY SENTINEL NODE BIOPSY Left 06/17/2021   Procedure: LEFT AXILLARY SENTINEL NODE BIOPSY;  Surgeon: Jovita Kussmaul, MD;  Location: [place];  Service: General;  Laterality: Left;   BREAST BIOPSY Left 05/08/2021   BREAST LUMPECTOMY Right 10/1998   BREAST LUMPECTOMY WITH RADIOACTIVE SEED AND SENTINEL LYMPH NODE BIOPSY Left 06/17/2021   Procedure: LEFT BREAST LUMPECTOMY WITH RADIOACTIVE SEEDS x 2;  Surgeon: Jovita Kussmaul, MD;  Location: [place];  Service: General;  Laterality: Left;   BREAST REDUCTION SURGERY Bilateral 07/24/2021   Procedure: BILATERAL ONCOPLASTIC BREAST REDUCTION;  Surgeon: Wallace Going, DO;  Location: [place];  Service: Plastics;  Laterality: Bilateral;   MASTOPEXY Bilateral 07/24/2021   Procedure: MASTOPEXY;  Surgeon: Wallace Going, DO;  Location: [place];  Service: Plastics;  Laterality: Bilateral;   Patient Active Problem List    Diagnosis Date Noted   Postoperative breast asymmetry 07/08/2021   Genetic testing 06/06/2021   Family history of breast cancer 05/24/2021   Family history of colon cancer 05/24/2021   Family history of pancreatic cancer 05/24/2021   Ductal carcinoma in situ (DCIS) of left breast 05/20/2021    PCP: Early Osmond, MD  REFERRING PROVIDER: Gardenia Phlegm, NP  REFERRING DIAG: [id number] (ICD-10-CM) - Intraductal carcinoma in situ of left breast   THERAPY DIAG:  Lymphedema, not elsewhere classified  Stiffness of left shoulder, not elsewhere classified  Abnormal posture  Intraductal carcinoma in situ of left breast  ONSET DATE: 12/22/21  Rationale for Evaluation and Treatment: Rehabilitation  SUBJECTIVE:  SUBJECTIVE STATEMENT: They saw something in my R breast and there were some nodules on the left side. They are trying to get my previous scans from [place] to compare.   PERTINENT HISTORY: Patient was diagnosed on 05/14/21 with L breast cancer. It measures 2.8 cm and is located in the upper outer quadrant. 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 06/17/21- L breast lumpectomy and SLNB 0/3, 07/24/21- bilateral oncoplastic breast reduction, Previous hx of  lumpectomy in 2000 on the R side for a nodule (not cancerous), hx of blood clot in 2021  PAIN:  Are you having pain? No  PRECAUTIONS: Other: L breast lymphedema  WEIGHT BEARING RESTRICTIONS: No  FALLS:  Has patient fallen in last 6 months? 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, full time Statistician in speech language pathology   LEISURE: pt recently got  Peloton fixed and plans to begin biking again  HAND DOMINANCE: right   PRIOR LEVEL OF FUNCTION: Independent  PATIENT GOALS: to reduce the pain and tenderness in L breast and learn how to manage lymphedema   OBJECTIVE:  COGNITION: Overall cognitive status: Within functional limits for tasks assessed   PALPATION: Thickened/fibrotic edema in L breast  OBSERVATIONS / OTHER ASSESSMENTS: peau d orange skin of L breast, L breast larger than R  POSTURE:  Forward head and rounded shoulders posture   UPPER EXTREMITY AROM/PROM:   A/PROM RIGHT  05/16/2021    Shoulder extension 76  Shoulder flexion 153  Shoulder abduction 162  Shoulder internal rotation 63  Shoulder external rotation 75                          (Blank rows = not tested)   A/PROM LEFT  05/16/2021 LEFT 02/19/22 LEFT 03/11/22  Shoulder extension 66 62   Shoulder flexion 153 140 155  Shoulder abduction 156 112 146  Shoulder internal rotation 63 55   Shoulder external rotation 74 85                           (Blank rows = not tested       UPPER EXTREMITY STRENGTH: 5/5     LYMPHEDEMA ASSESSMENTS:    LANDMARK RIGHT  05/16/2021 RIGHT 02/19/22  10 cm proximal to olecranon process 38.6 37.5  Olecranon process '31 30  10 '$ cm proximal to ulnar styloid process 27 26  Just proximal to ulnar styloid process 19.7 20  Across hand at thumb web space 22 22  At base of 2nd digit 8 7.8  (Blank rows = not tested)   LANDMARK LEFT  05/16/2021 LEFT 02/19/22  10 cm proximal to olecranon process 38 38.5  Olecranon process '31 31  10 '$ cm proximal to ulnar styloid process 26.9 25.5  Just proximal to ulnar styloid process 20.7 19.4  Across hand at thumb web space 21.5 21.7  At base of 2nd digit 7.5 7.5  (Blank rows = not tested)  LYMPHEDEMA ASSESSMENTS:   SURGERY TYPE/DATE: 06/17/21- L br lumpectomy and SLNB; 07/24/21 - bilateral oncoplastic breast reduction; hx of R lumpectomy in 2000 - non cancerous  NUMBER OF LYMPH NODES REMOVED:  0/3  CHEMOTHERAPY: none  RADIATION:complted on 10/16/21  HORMONE TREATMENT: began 10/2021- on Tamoxifen  INFECTIONS: none  FUNCTIONAL TESTS:  Breast Complaints Scale-  46     L-DEX LYMPHEDEMA SCREENING:  The patient was assessed using the L-Dex machine today to produce a lymphedema index baseline score. The patient will be reassessed on a regular basis (typically every 3 months) to obtain new L-Dex scores. If the score is > 6.5 points away from his/her baseline score indicating onset of subclinical lymphedema, it will be recommended to wear a compression garment for 4 weeks, 12 hours per day and then be reassessed. If the score continues to be > 6.5 points from baseline at reassessment, we will initiate lymphedema treatment. Assessing in this manner has a 95% rate of preventing clinically significant lymphedema.     TODAY'S TREATMENT:                                                                                                                                         DATE:  04/21/22: Manual Therapy MLD In supine: Short neck, superficial and deep abdominals, Rt axillary nodes, Rt intact upper quadrant sequence and establishment of anterior inter-axillary pathway, Lt inguinal nodes and establishment of Lt axillo-inguinal pathway, then Lt breast moving fluid towards pathways spending extra time in area of fibrosis at medial breast then retracing all steps.  PROM of Lt shoulder in to flexion and abduction with some tightness in inferior breast STM to scar tissue in L inferior breast to decrease scar tissue and to SLNB scar/axillary area of tightness  04/16/22: Manual Therapy MLD In supine: Short neck, superficial and deep abdominals, Rt axillary nodes, Rt intact upper quadrant sequence and establishment of anterior inter-axillary pathway, Lt inguinal nodes and establishment of Lt axillo-inguinal pathway, then Lt breast moving fluid towards pathways spending extra time in area of fibrosis  at medial breast then retracing all steps.  PROM of Lt shoulder in to flexion and abduction with some tightness in inferior breast STM to scar tissue in L inferior breast to decrease scar tissue and to SLNB scar/axillary area of tightness  04/09/22: Manual Therapy MLD In supine: Short neck, superficial and deep abdominals, Rt axillary nodes, Rt intact upper quadrant sequence and establishment of anterior inter-axillary pathway, Lt inguinal nodes and establishment of Lt axillo-inguinal pathway, then Lt breast moving fluid towards pathways spending extra time in area of fibrosis at medial breast then retracing all steps.  PROM in to flexion and abduction with some tightness in inferior breast STM to scar tissue in L inferior breast to decrease scar tissue and to SLNB scar  04/08/22: Manual Therapy MLD In supine: Short neck, superficial and deep abdominals, Rt axillary nodes, Rt intact upper quadrant sequence and establishment of anterior inter-axillary pathway, Lt inguinal nodes and establishment of Lt axillo-inguinal pathway, then Lt breast moving fluid towards pathways spending extra time in area of fibrosis at medial breast then retracing all steps.  PROM in to flexion and abduction with some tightness in  inferior breast STM to scar tissue in L inferior breast to decrease scar tissue     PATIENT EDUCATION:  Education details: anatomy and physiology of the lymphatic system, need for compression bra, chip pack, basic principles of MLD Person educated: Patient Education method: Explanation Education comprehension: verbalized understanding  HOME EXERCISE PROGRAM: Wear compression bra as much as possible  ASSESSMENT:  CLINICAL IMPRESSION: Pt's scar tissue is improving in lateral breast and her end range of motion is not demonstrating increased tightness as it had been. Her breast is less fibrotic and she has been doing her self massages. Will plan to place her on hold after her next visit  and reassess in a month to see if she is able to independently manage.   OBJECTIVE IMPAIRMENTS: decreased knowledge of condition, decreased knowledge of use of DME, decreased ROM, decreased strength, increased edema, increased fascial restrictions, impaired UE functional use, postural dysfunction, and pain.   ACTIVITY LIMITATIONS: sleeping and reach over head  PARTICIPATION LIMITATIONS:  none  PERSONAL FACTORS: Time since onset of injury/illness/exacerbation are also affecting patient's functional outcome.   REHAB POTENTIAL: Good  CLINICAL DECISION MAKING: Stable/uncomplicated  EVALUATION COMPLEXITY: Low  GOALS: Goals reviewed with patient? Yes  SHORT TERM GOALS=LONG TERM GOALS Target date: 03/19/22  Pt will be independent in self MLD for long term management of lymphedema.  Baseline: no knowledge; Pt reports her and her husband are managing but will benefit from further review - 03/26/22 Goal status: Progress towards goal  2.  Pt will demonstrate decreased pore size and improved skin mobility in L breast to allow improved comfort.  Baseline: Pores are still present though are less noticeable, and skin is more pliable - 03/26/22 Goal status: Progress towards goal  3.  Pt will demonstrate 150 degrees of L shoulder abduction to allow her to reach out to the side.  Baseline: 112; 142 degrees - 03/26/22 Goal status:Progress towards goal  4.  Pt will be independent in a home exericse program for continued stretching and strengthening.  Baseline: Pt is working towards independence with initial HEP, we will be switching focus  Goal status: Progress towards goals   PLAN:  PT FREQUENCY: 2x/week  PT DURATION: 4 weeks  PLANNED INTERVENTIONS: Therapeutic exercises, Therapeutic activity, Patient/Family education, Self Care, Joint mobilization, Orthotic/Fit training, Manual lymph drainage, Compression bandaging, scar mobilization, Taping, Vasopneumatic device, Manual therapy, and  Re-evaluation  PLAN FOR NEXT SESSION: STM to lateral trunk, cont and review MLD to L breast,  ROM to L shoulder both PROM and AAROM, give HEP for this and progress prn, compression pump?   Collie Siad, PTA 04/21/22 9:00 AM

## 2022-04-23 ENCOUNTER — Ambulatory Visit: Payer: BC Managed Care – PPO | Admitting: Physical Therapy

## 2022-04-23 ENCOUNTER — Encounter: Payer: Self-pay | Admitting: Physical Therapy

## 2022-04-23 DIAGNOSIS — M25612 Stiffness of left shoulder, not elsewhere classified: Secondary | ICD-10-CM

## 2022-04-23 DIAGNOSIS — D0512 Intraductal carcinoma in situ of left breast: Secondary | ICD-10-CM

## 2022-04-23 DIAGNOSIS — R293 Abnormal posture: Secondary | ICD-10-CM

## 2022-04-23 DIAGNOSIS — I890 Lymphedema, not elsewhere classified: Secondary | ICD-10-CM

## 2022-04-23 NOTE — Therapy (Signed)
OUTPATIENT PHYSICAL THERAPY ONCOLOGY TREATMENT  Patient Name: Joan Dalton MRN: [id number] DOB:Aug 13, 1975, 47 y.o., female Today's Date: 04/23/2022  END OF SESSION:  PT End of Session - 04/23/22 0959     Visit Number 16    Number of Visits 17    Date for PT Re-Evaluation 04/23/22    PT Start Time 0903    PT Stop Time 0958    PT Time Calculation (min) 55 min    Activity Tolerance Patient tolerated treatment well    Behavior During Therapy [facility] for tasks assessed/performed              Past Medical History:  Diagnosis Date   Anemia    Anxiety    Bilateral swelling of feet    Breast cancer ([place]) 05/08/2021   Constipation    Family history of breast cancer    Family history of colon cancer    Family history of pancreatic cancer    Fatty liver    Hx of blood clots    Hypertension    Joint pain    Joint pain    Lactose intolerance    Palpitations    Personal history of radiation therapy    Vitamin D deficiency    Past Surgical History:  Procedure Laterality Date   ABLATION     D&C Ablation of Uterus   AXILLARY SENTINEL NODE BIOPSY Left 06/17/2021   Procedure: LEFT AXILLARY SENTINEL NODE BIOPSY;  Surgeon: Jovita Kussmaul, MD;  Location: [place];  Service: General;  Laterality: Left;   BREAST BIOPSY Left 05/08/2021   BREAST LUMPECTOMY Right 10/1998   BREAST LUMPECTOMY WITH RADIOACTIVE SEED AND SENTINEL LYMPH NODE BIOPSY Left 06/17/2021   Procedure: LEFT BREAST LUMPECTOMY WITH RADIOACTIVE SEEDS x 2;  Surgeon: Jovita Kussmaul, MD;  Location: [place];  Service: General;  Laterality: Left;   BREAST REDUCTION SURGERY Bilateral 07/24/2021   Procedure: BILATERAL ONCOPLASTIC BREAST REDUCTION;  Surgeon: Wallace Going, DO;  Location: [place];  Service: Plastics;  Laterality: Bilateral;   MASTOPEXY Bilateral 07/24/2021   Procedure: MASTOPEXY;  Surgeon: Wallace Going, DO;  Location: [place];  Service: Plastics;  Laterality: Bilateral;   Patient Active Problem List    Diagnosis Date Noted   Postoperative breast asymmetry 07/08/2021   Genetic testing 06/06/2021   Family history of breast cancer 05/24/2021   Family history of colon cancer 05/24/2021   Family history of pancreatic cancer 05/24/2021   Ductal carcinoma in situ (DCIS) of left breast 05/20/2021    PCP: Early Osmond, MD  REFERRING PROVIDER: Gardenia Phlegm, NP  REFERRING DIAG: [id number] (ICD-10-CM) - Intraductal carcinoma in situ of left breast   THERAPY DIAG:  Lymphedema, not elsewhere classified  Stiffness of left shoulder, not elsewhere classified  Abnormal posture  Intraductal carcinoma in situ of left breast  ONSET DATE: 12/22/21  Rationale for Evaluation and Treatment: Rehabilitation  SUBJECTIVE:  SUBJECTIVE STATEMENT: They saw something in my R breast and there were some nodules on the left side. They are trying to get my previous scans from [place] to compare.   PERTINENT HISTORY: Patient was diagnosed on 05/14/21 with L breast cancer. It measures 2.8 cm and is located in the upper outer quadrant. 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 06/17/21- L breast lumpectomy and SLNB 0/3, 07/24/21- bilateral oncoplastic breast reduction, Previous hx of  lumpectomy in 2000 on the R side for a nodule (not cancerous), hx of blood clot in 2021  PAIN:  Are you having pain? No  PRECAUTIONS: Other: L breast lymphedema  WEIGHT BEARING RESTRICTIONS: No  FALLS:  Has patient fallen in last 6 months? 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, full time Statistician in speech language pathology   LEISURE: pt recently got  Peloton fixed and plans to begin biking again  HAND DOMINANCE: right   PRIOR LEVEL OF FUNCTION: Independent  PATIENT GOALS: to reduce the pain and tenderness in L breast and learn how to manage lymphedema   OBJECTIVE:  COGNITION: Overall cognitive status: Within functional limits for tasks assessed   PALPATION: Thickened/fibrotic edema in L breast  OBSERVATIONS / OTHER ASSESSMENTS: peau d orange skin of L breast, L breast larger than R  POSTURE:  Forward head and rounded shoulders posture   UPPER EXTREMITY AROM/PROM:   A/PROM RIGHT  05/16/2021    Shoulder extension 76  Shoulder flexion 153  Shoulder abduction 162  Shoulder internal rotation 63  Shoulder external rotation 75                          (Blank rows = not tested)   A/PROM LEFT  05/16/2021 LEFT 02/19/22 LEFT 03/11/22  Shoulder extension 66 62   Shoulder flexion 153 140 155  Shoulder abduction 156 112 146  Shoulder internal rotation 63 55   Shoulder external rotation 74 85                           (Blank rows = not tested       UPPER EXTREMITY STRENGTH: 5/5     LYMPHEDEMA ASSESSMENTS:    LANDMARK RIGHT  05/16/2021 RIGHT 02/19/22  10 cm proximal to olecranon process 38.6 37.5  Olecranon process '31 30  10 '$ cm proximal to ulnar styloid process 27 26  Just proximal to ulnar styloid process 19.7 20  Across hand at thumb web space 22 22  At base of 2nd digit 8 7.8  (Blank rows = not tested)   LANDMARK LEFT  05/16/2021 LEFT 02/19/22  10 cm proximal to olecranon process 38 38.5  Olecranon process '31 31  10 '$ cm proximal to ulnar styloid process 26.9 25.5  Just proximal to ulnar styloid process 20.7 19.4  Across hand at thumb web space 21.5 21.7  At base of 2nd digit 7.5 7.5  (Blank rows = not tested)  LYMPHEDEMA ASSESSMENTS:   SURGERY TYPE/DATE: 06/17/21- L br lumpectomy and SLNB; 07/24/21 - bilateral oncoplastic breast reduction; hx of R lumpectomy in 2000 - non cancerous  NUMBER OF LYMPH NODES REMOVED:  0/3  CHEMOTHERAPY: none  RADIATION:complted on 10/16/21  HORMONE TREATMENT: began 10/2021- on Tamoxifen  INFECTIONS: none  FUNCTIONAL TESTS:  Breast Complaints Scale-  46     L-DEX LYMPHEDEMA SCREENING:  The patient was assessed using the L-Dex machine today to produce a lymphedema index baseline score. The patient will be reassessed on a regular basis (typically every 3 months) to obtain new L-Dex scores. If the score is > 6.5 points away from his/her baseline score indicating onset of subclinical lymphedema, it will be recommended to wear a compression garment for 4 weeks, 12 hours per day and then be reassessed. If the score continues to be > 6.5 points from baseline at reassessment, we will initiate lymphedema treatment. Assessing in this manner has a 95% rate of preventing clinically significant lymphedema.     TODAY'S TREATMENT:                                                                                                                                         DATE:  04/23/22: Manual Therapy MLD In supine: Short neck, superficial and deep abdominals, Rt axillary nodes, Rt intact upper quadrant sequence and establishment of anterior inter-axillary pathway, Lt inguinal nodes and establishment of Lt axillo-inguinal pathway, then Lt breast moving fluid towards pathways spending extra time in area of fibrosis at medial breast then retracing all steps.  PROM of Lt shoulder in to flexion and abduction with some tightness in inferior breast  04/21/22: Manual Therapy MLD In supine: Short neck, superficial and deep abdominals, Rt axillary nodes, Rt intact upper quadrant sequence and establishment of anterior inter-axillary pathway, Lt inguinal nodes and establishment of Lt axillo-inguinal pathway, then Lt breast moving fluid towards pathways spending extra time in area of fibrosis at medial breast then retracing all steps.  PROM of Lt shoulder in to flexion and abduction with some  tightness in inferior breast STM to scar tissue in L inferior breast to decrease scar tissue and to SLNB scar/axillary area of tightness  04/16/22: Manual Therapy MLD In supine: Short neck, superficial and deep abdominals, Rt axillary nodes, Rt intact upper quadrant sequence and establishment of anterior inter-axillary pathway, Lt inguinal nodes and establishment of Lt axillo-inguinal pathway, then Lt breast moving fluid towards pathways spending extra time in area of fibrosis at medial breast then retracing all steps.  PROM of Lt shoulder in to flexion and abduction with some tightness in inferior breast STM to scar tissue in L inferior breast to decrease scar tissue and to SLNB scar/axillary area of tightness  04/09/22: Manual Therapy MLD In supine: Short neck, superficial and deep abdominals, Rt axillary nodes, Rt intact upper quadrant sequence and establishment of anterior inter-axillary pathway, Lt inguinal nodes and establishment of Lt axillo-inguinal pathway, then Lt breast moving fluid towards pathways spending extra time in area of fibrosis at medial breast then retracing all steps.  PROM in to flexion and abduction with some tightness in inferior breast STM to scar tissue in L inferior breast to decrease scar  tissue and to SLNB scar  04/08/22: Manual Therapy MLD In supine: Short neck, superficial and deep abdominals, Rt axillary nodes, Rt intact upper quadrant sequence and establishment of anterior inter-axillary pathway, Lt inguinal nodes and establishment of Lt axillo-inguinal pathway, then Lt breast moving fluid towards pathways spending extra time in area of fibrosis at medial breast then retracing all steps.  PROM in to flexion and abduction with some tightness in inferior breast STM to scar tissue in L inferior breast to decrease scar tissue     PATIENT EDUCATION:  Education details: anatomy and physiology of the lymphatic system, need for compression bra, chip pack, basic  principles of MLD Person educated: Patient Education method: Explanation Education comprehension: verbalized understanding  HOME EXERCISE PROGRAM: Wear compression bra as much as possible  ASSESSMENT:  CLINICAL IMPRESSION: Assessed pt's progress twoards goals in therapy. She has met nearly all goals for therapy. Her breast is much softer and her lymphedema has greatly improved. Will place pt on hold for one month and reassess to see if she is able to independently manage her lymphedema.   OBJECTIVE IMPAIRMENTS: decreased knowledge of condition, decreased knowledge of use of DME, decreased ROM, decreased strength, increased edema, increased fascial restrictions, impaired UE functional use, postural dysfunction, and pain.   ACTIVITY LIMITATIONS: sleeping and reach over head  PARTICIPATION LIMITATIONS:  none  PERSONAL FACTORS: Time since onset of injury/illness/exacerbation are also affecting patient's functional outcome.   REHAB POTENTIAL: Good  CLINICAL DECISION MAKING: Stable/uncomplicated  EVALUATION COMPLEXITY: Low  GOALS: Goals reviewed with patient? Yes  SHORT TERM GOALS=LONG TERM GOALS Target date: 03/19/22  Pt will be independent in self MLD for long term management of lymphedema.  Baseline: no knowledge; Pt reports her and her husband are managing but will benefit from further review - 03/26/22; 04/23/22- pt feels independent with this Goal status: MET 04/23/22  2.  Pt will demonstrate decreased pore size and improved skin mobility in L breast to allow improved comfort.  Baseline: Pores are still present though are less noticeable, and skin is more pliable - 03/26/22, 04/23/22- fibrosis much better, improved skin mobility and smaller pores Goal status: MET 04/23/22  3.  Pt will demonstrate 150 degrees of L shoulder abduction to allow her to reach out to the side.  Baseline: 112; 142 degrees - 03/26/22 Goal status:Progress towards goal  4.  Pt will be independent in a home  exericse program for continued stretching and strengthening.  Baseline: Pt is working towards independence with initial HEP, 04/23/22- pt is indep with this Goal status: MET 04/23/22  5. Pt will be able to independently manage her lymphedema at home over the next month to decrease risk of infection.  Goal status: NEW   PLAN:  PT FREQUENCY: 2x/week  PT DURATION: 4 weeks  PLANNED INTERVENTIONS: Therapeutic exercises, Therapeutic activity, Patient/Family education, Self Care, Joint mobilization, Orthotic/Fit training, Manual lymph drainage, Compression bandaging, scar mobilization, Taping, Vasopneumatic device, Manual therapy, and Re-evaluation  PLAN FOR NEXT SESSION: reassess breast lymphedema and ROM; STM to lateral trunk, cont and review MLD to L breast,  ROM to L shoulder both PROM and AAROM, give HEP for this and progress prn, compression pump?   Allyson Sabal [place], [place] 04/23/22 9:59 AM

## 2022-05-02 ENCOUNTER — Other Ambulatory Visit: Payer: Self-pay | Admitting: Adult Health

## 2022-05-02 DIAGNOSIS — R928 Other abnormal and inconclusive findings on diagnostic imaging of breast: Secondary | ICD-10-CM

## 2022-05-05 ENCOUNTER — Other Ambulatory Visit (INDEPENDENT_AMBULATORY_CARE_PROVIDER_SITE_OTHER): Payer: Self-pay | Admitting: Family Medicine

## 2022-05-05 DIAGNOSIS — E559 Vitamin D deficiency, unspecified: Secondary | ICD-10-CM

## 2022-05-06 ENCOUNTER — Other Ambulatory Visit: Payer: Self-pay | Admitting: Adult Health

## 2022-05-06 DIAGNOSIS — R928 Other abnormal and inconclusive findings on diagnostic imaging of breast: Secondary | ICD-10-CM

## 2022-05-06 DIAGNOSIS — N6489 Other specified disorders of breast: Secondary | ICD-10-CM

## 2022-05-07 ENCOUNTER — Ambulatory Visit
Admission: RE | Admit: 2022-05-07 | Discharge: 2022-05-07 | Disposition: A | Discharge status: Home / Self Care | Payer: BC Managed Care – PPO | Source: Ambulatory Visit | Attending: Adult Health | Admitting: Adult Health

## 2022-05-07 ENCOUNTER — Other Ambulatory Visit: Payer: Self-pay | Admitting: Adult Health

## 2022-05-07 DIAGNOSIS — R928 Other abnormal and inconclusive findings on diagnostic imaging of breast: Secondary | ICD-10-CM

## 2022-05-07 DIAGNOSIS — N6489 Other specified disorders of breast: Secondary | ICD-10-CM

## 2022-05-07 HISTORY — PX: BREAST BIOPSY: SHX20

## 2022-05-12 ENCOUNTER — Other Ambulatory Visit: Payer: Self-pay | Admitting: Adult Health

## 2022-05-12 ENCOUNTER — Ambulatory Visit
Admission: RE | Admit: 2022-05-12 | Discharge: 2022-05-12 | Disposition: A | Discharge status: Home / Self Care | Payer: BC Managed Care – PPO | Source: Ambulatory Visit | Attending: Adult Health | Admitting: Adult Health

## 2022-05-12 DIAGNOSIS — R928 Other abnormal and inconclusive findings on diagnostic imaging of breast: Secondary | ICD-10-CM

## 2022-05-12 DIAGNOSIS — R922 Inconclusive mammogram: Secondary | ICD-10-CM

## 2022-05-12 DIAGNOSIS — D0512 Intraductal carcinoma in situ of left breast: Secondary | ICD-10-CM

## 2022-05-12 DIAGNOSIS — N6489 Other specified disorders of breast: Secondary | ICD-10-CM

## 2022-05-12 NOTE — Progress Notes (Signed)
Patient was to undergo stereotactic biopsy of the right breast mass noted on previous ultrasound and mammogram however when she returned for biopsy today they could not identify this abnormal area and MRI of the breast was recommended to exclude a persistent mass warranting tissue diagnosis.  I placed orders for this today.  I attempted to call Joan Dalton to let her know that I placed orders for this today however her phone went to voicemail and I was unable to leave a message because her mailbox was full.  [name], NP 05/12/22 1:14 PM Medical Oncology and Hematology [facility] [place], [place] [address] Tel. [phone]    Fax. [phone]

## 2022-05-15 ENCOUNTER — Ambulatory Visit (INDEPENDENT_AMBULATORY_CARE_PROVIDER_SITE_OTHER): Payer: BC Managed Care – PPO | Admitting: Family Medicine

## 2022-05-15 ENCOUNTER — Encounter (INDEPENDENT_AMBULATORY_CARE_PROVIDER_SITE_OTHER): Payer: Self-pay | Admitting: Family Medicine

## 2022-05-15 VITALS — BP 132/84 | HR 73 | Temp 98.6°F | Ht 66.0 in | Wt 254.0 lb

## 2022-05-15 DIAGNOSIS — E559 Vitamin D deficiency, unspecified: Secondary | ICD-10-CM

## 2022-05-15 DIAGNOSIS — Z6841 Body Mass Index (BMI) 40.0 and over, adult: Secondary | ICD-10-CM

## 2022-05-15 DIAGNOSIS — E88819 Insulin resistance, unspecified: Secondary | ICD-10-CM

## 2022-05-15 DIAGNOSIS — E7849 Other hyperlipidemia: Secondary | ICD-10-CM

## 2022-05-15 DIAGNOSIS — E669 Obesity, unspecified: Secondary | ICD-10-CM

## 2022-05-15 MED ORDER — VITAMIN D (ERGOCALCIFEROL) 1.25 MG (50000 UNIT) PO CAPS: 50000.0000 [IU] | ORAL_CAPSULE | ORAL | 0 refills | Status: DC

## 2022-05-15 NOTE — Progress Notes (Deleted)
Patient has been trying to really stick to the program while also doing her church fast.  Did mostly soup- Progresso roasted vegetable soup and minimize dairy.  She used this opportunity to really minimize distractions.  Otherwise she was doing herbal tea most days.  Wants to continue to explore dairy free or decreased dairy options.  Has a function tonight and a party March 1.

## 2022-05-16 LAB — HEMOGLOBIN A1C
Est. average glucose Bld gHb Est-mCnc: 117 mg/dL
Hgb A1c MFr Bld: 5.7 % — ABNORMAL HIGH (ref 4.8–5.6)

## 2022-05-16 LAB — VITAMIN D 25 HYDROXY (VIT D DEFICIENCY, FRACTURES): Vit D, 25-Hydroxy: 32.2 ng/mL (ref 30.0–100.0)

## 2022-05-16 LAB — COMPREHENSIVE METABOLIC PANEL
ALT: 21 IU/L (ref 0–32)
AST: 17 IU/L (ref 0–40)
Albumin/Globulin Ratio: 1.9 (ref 1.2–2.2)
Albumin: 4.2 g/dL (ref 3.9–4.9)
Alkaline Phosphatase: 43 IU/L — ABNORMAL LOW (ref 44–121)
BUN/Creatinine Ratio: 12 (ref 9–23)
BUN: 8 mg/dL (ref 6–24)
Bilirubin Total: 0.4 mg/dL (ref 0.0–1.2)
CO2: 24 mmol/L (ref 20–29)
Calcium: 8.8 mg/dL (ref 8.7–10.2)
Chloride: 106 mmol/L (ref 96–106)
Creatinine, Ser: 0.65 mg/dL (ref 0.57–1.00)
Globulin, Total: 2.2 g/dL (ref 1.5–4.5)
Glucose: 100 mg/dL — ABNORMAL HIGH (ref 70–99)
Potassium: 4.3 mmol/L (ref 3.5–5.2)
Sodium: 142 mmol/L (ref 134–144)
Total Protein: 6.4 g/dL (ref 6.0–8.5)
eGFR: 109 mL/min/{1.73_m2} (ref >59)

## 2022-05-16 LAB — LIPID PANEL WITH LDL/HDL RATIO
Cholesterol, Total: 177 mg/dL (ref 100–199)
HDL: 55 mg/dL (ref >39)
LDL Chol Calc (NIH): 106 mg/dL — ABNORMAL HIGH (ref 0–99)
LDL/HDL Ratio: 1.9 ratio (ref 0.0–3.2)
Triglycerides: 84 mg/dL (ref 0–149)
VLDL Cholesterol Cal: 16 mg/dL (ref 5–40)

## 2022-05-16 LAB — INSULIN, RANDOM: INSULIN: 9.7 u[IU]/mL (ref 2.6–24.9)

## 2022-05-20 ENCOUNTER — Other Ambulatory Visit: Payer: BC Managed Care – PPO

## 2022-05-20 NOTE — Progress Notes (Signed)
Chief Complaint:   OBESITY Joan Dalton is here to discuss her progress with her obesity treatment plan along with follow-up of her obesity related diagnoses. Joan Dalton is on the [employer] and states she is following her eating plan approximately 75% of the time. Joan Dalton states she is exercising 0 minutes 0 times per week.  Today's visit was #: 7 Starting weight: 275 lbs Starting date: 11/05/2021 Today's weight: 254 lbs Today's date: 05/15/2022 Total lbs lost to date: 21 lbs Total lbs lost since last in-office visit: 5  Interim History: Joan Dalton has been trying to really stick to the program while also doing her church fast.  Did mostly soup- Progresso roasted vegetable soup and minimize dairy.  She used this opportunity to really minimize distractions.  Otherwise she was doing herbal tea most days.  Wants to continue to explore dairy free or decreased dairy options.  Has a function tonight and a party March 1.  Subjective:   1. Vitamin D deficiency Last level significantly decreased go.  Notes fatigue.  2. Insulin resistance Not on medications.  Last insulin level elevated.  3. Other hyperlipidemia LDL of 137, HDL of 74, and trig of 76.  Not on medications.  Assessment/Plan:   1. Vitamin D deficiency We will obtain labs today.  We will refill Vit D 50K IU once a week for 1 month with 0 refills.  - VITAMIN D 25 Hydroxy (Vit-D Deficiency, Fractures)  -Refill Vitamin D, Ergocalciferol, (DRISDOL) 1.25 MG (50000 UNIT) CAPS capsule; Take 1 capsule (50,000 Units total) by mouth every 7 (seven) days.  Dispense: 4 capsule; Refill: 0  2. Insulin resistance We will obtain labs today.  - Comprehensive metabolic panel - Hemoglobin A1c - Insulin, random  3. Other hyperlipidemia We will obtain labs today.  - Lipid Panel With LDL/HDL Ratio  4. BMI 40.0-44.9, adult ([place])  5. Obesity with starting BMI of 44.5 Joan Dalton is currently in the action stage of change. As  such, her goal is to continue with weight loss efforts. She has agreed to the [employer].   Exercise goals: All adults should avoid inactivity. Some physical activity is better than none, and adults who participate in any amount of physical activity gain some health benefits.  Behavioral modification strategies: increasing lean protein intake, meal planning and cooking strategies, keeping healthy foods in the home, and planning for success.  Joan Dalton has agreed to follow-up with our clinic in 3 weeks. She was informed of the importance of frequent follow-up visits to maximize her success with intensive lifestyle modifications for her multiple health conditions.   Joan Dalton was informed we would discuss her lab results at her next visit unless there is a critical issue that needs to be addressed sooner. Joan Dalton agreed to keep her next visit at the agreed upon time to discuss these results.  Objective:   Blood pressure 132/84, pulse 73, temperature 98.6 F (37 C), height '5\' 6"'$  (1.676 m), weight 254 lb (115.2 kg), SpO2 97 %. Body mass index is 41 kg/m.  General: Cooperative, alert, well developed, in no acute distress. HEENT: Conjunctivae and lids unremarkable. Cardiovascular: Regular rhythm.  Lungs: Normal work of breathing. Neurologic: No focal deficits.   Lab Results  Component Value Date   CREATININE 0.65 05/15/2022   BUN 8 05/15/2022   NA 142 05/15/2022   K 4.3 05/15/2022   CL 106 05/15/2022   CO2 24 05/15/2022   Lab Results  Component Value Date   ALT 21 05/15/2022  AST 17 05/15/2022   ALKPHOS 43 (L) 05/15/2022   BILITOT 0.4 05/15/2022   Lab Results  Component Value Date   HGBA1C 5.7 (H) 05/15/2022   HGBA1C 5.6 11/05/2021   Lab Results  Component Value Date   INSULIN 9.7 05/15/2022   INSULIN 19.4 11/05/2021   Lab Results  Component Value Date   TSH 1.080 11/05/2021   Lab Results  Component Value Date   CHOL 177 05/15/2022   HDL 55 05/15/2022    LDLCALC 106 (H) 05/15/2022   TRIG 84 05/15/2022   Lab Results  Component Value Date   VD25OH 32.2 05/15/2022   VD25OH 17.9 (L) 11/05/2021   Lab Results  Component Value Date   WBC 5.0 11/05/2021   HGB 13.3 11/05/2021   HCT 40.0 11/05/2021   MCV 95 11/05/2021   PLT 266 11/05/2021   No results found for: "IRON", "TIBC", "FERRITIN"  Attestation Statements:   Reviewed by clinician on day of visit: allergies, medications, problem list, medical history, surgical history, family history, social history, and previous encounter notes.  I, Elnora Morrison, RMA am acting as transcriptionist for Coralie Common, MD.  I have reviewed the above documentation for accuracy and completeness, and I agree with the above. - Coralie Common, MD

## 2022-05-22 DIAGNOSIS — R590 Localized enlarged lymph nodes: Secondary | ICD-10-CM

## 2022-05-26 ENCOUNTER — Ambulatory Visit: Payer: BC Managed Care – PPO | Admitting: Physical Therapy

## 2022-05-28 ENCOUNTER — Encounter: Payer: Self-pay | Admitting: Physical Therapy

## 2022-05-28 ENCOUNTER — Ambulatory Visit: Payer: BC Managed Care – PPO | Attending: Adult Health | Admitting: Physical Therapy

## 2022-05-28 DIAGNOSIS — D0512 Intraductal carcinoma in situ of left breast: Secondary | ICD-10-CM | POA: Diagnosis present

## 2022-05-28 DIAGNOSIS — R293 Abnormal posture: Secondary | ICD-10-CM | POA: Diagnosis present

## 2022-05-28 DIAGNOSIS — M25612 Stiffness of left shoulder, not elsewhere classified: Secondary | ICD-10-CM

## 2022-05-28 DIAGNOSIS — I890 Lymphedema, not elsewhere classified: Secondary | ICD-10-CM

## 2022-05-28 NOTE — Therapy (Signed)
OUTPATIENT PHYSICAL THERAPY ONCOLOGY TREATMENT  Patient Name: Joan Dalton MRN: [id number] DOB:[birth year], 47 y.o., female Today's Date: 05/28/2022  END OF SESSION:  PT End of Session - 05/28/22 1006     Visit Number 17    Number of Visits 17    Date for PT Re-Evaluation 04/23/22    PT Start Time 1005    Activity Tolerance Patient tolerated treatment well    Behavior During Therapy [facility] for tasks assessed/performed              Past Medical History:  Diagnosis Date   Anemia    Anxiety    Bilateral swelling of feet    Breast cancer ([place]) 05/08/2021   Constipation    Family history of breast cancer    Family history of colon cancer    Family history of pancreatic cancer    Fatty liver    Hx of blood clots    Hypertension    Joint pain    Joint pain    Lactose intolerance    Palpitations    Personal history of radiation therapy    Vitamin D deficiency    Past Surgical History:  Procedure Laterality Date   ABLATION     D&C Ablation of Uterus   AXILLARY SENTINEL NODE BIOPSY Left 06/17/2021   Procedure: LEFT AXILLARY SENTINEL NODE BIOPSY;  Surgeon: Jovita Kussmaul, MD;  Location: [facility];  Service: General;  Laterality: Left;   BREAST BIOPSY Left 05/08/2021   BREAST BIOPSY Right 05/07/2022   [place] RT BREAST BX W LOC DEV 1ST LESION IMG BX SPEC US GUIDE 05/07/2022 [facility]   BREAST LUMPECTOMY Right 10/1998   BREAST LUMPECTOMY WITH RADIOACTIVE SEED AND SENTINEL LYMPH NODE BIOPSY Left 06/17/2021   Procedure: LEFT BREAST LUMPECTOMY WITH RADIOACTIVE SEEDS x 2;  Surgeon: Jovita Kussmaul, MD;  Location: [facility];  Service: General;  Laterality: Left;   BREAST REDUCTION SURGERY Bilateral 07/24/2021   Procedure: BILATERAL ONCOPLASTIC BREAST REDUCTION;  Surgeon: Wallace Going, DO;  Location: [facility];  Service: Plastics;  Laterality: Bilateral;   MASTOPEXY Bilateral 07/24/2021   Procedure: MASTOPEXY;  Surgeon: Wallace Going, DO;  Location: [place] [facility];  Service:  Plastics;  Laterality: Bilateral;   Patient Active Problem List   Diagnosis Date Noted   Postoperative breast asymmetry 07/08/2021   Genetic testing 06/06/2021   Family history of breast cancer 05/24/2021   Family history of colon cancer 05/24/2021   Family history of pancreatic cancer 05/24/2021   Ductal carcinoma in situ (DCIS) of left breast 05/20/2021    PCP: Early Osmond, MD  REFERRING PROVIDER: Gardenia Phlegm, NP  REFERRING DIAG: [phone] (ICD-10-CM) - Intraductal carcinoma in situ of left breast   THERAPY DIAG:  Lymphedema, not elsewhere classified  Stiffness of left shoulder, not elsewhere classified  Abnormal posture  Intraductal carcinoma in situ of left breast  ONSET DATE: 12/22/21  Rationale for Evaluation and Treatment: Rehabilitation  SUBJECTIVE:  SUBJECTIVE STATEMENT: I have been managing the swelling. I need another chip pack because I have misplaced it. I have been doing the stretches.   PERTINENT HISTORY: Patient was diagnosed on 05/14/21 with L breast cancer. It measures 2.8 cm and is located in the upper outer quadrant. 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 06/17/21- L breast lumpectomy and SLNB 0/3, 07/24/21- bilateral oncoplastic breast reduction, Previous hx of  lumpectomy in 2000 on the R side for a nodule (not cancerous), hx of blood clot in 2021  PAIN:  Are you having pain? No  PRECAUTIONS: Other: L breast lymphedema  WEIGHT BEARING RESTRICTIONS: No  FALLS:  Has patient fallen in last 6 months? 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, full time Statistician in speech  language pathology   LEISURE: pt recently got [employer] fixed and plans to begin biking again  HAND DOMINANCE: right   PRIOR LEVEL OF FUNCTION: Independent  PATIENT GOALS: to reduce the pain and tenderness in L breast and learn how to manage lymphedema   OBJECTIVE:  COGNITION: Overall cognitive status: Within functional limits for tasks assessed   PALPATION: Thickened/fibrotic edema in L breast  OBSERVATIONS / OTHER ASSESSMENTS: peau d orange skin of L breast, L breast larger than R  POSTURE:  Forward head and rounded shoulders posture   UPPER EXTREMITY AROM/PROM:   A/PROM RIGHT  05/16/2021    Shoulder extension 76  Shoulder flexion 153  Shoulder abduction 162  Shoulder internal rotation 63  Shoulder external rotation 75                          (Blank rows = not tested)   A/PROM LEFT  05/16/2021 LEFT 02/19/22 LEFT 03/11/22  Shoulder extension 66 62   Shoulder flexion 153 140 155  Shoulder abduction 156 112 146  Shoulder internal rotation 63 55   Shoulder external rotation 74 85                           (Blank rows = not tested       UPPER EXTREMITY STRENGTH: 5/5     LYMPHEDEMA ASSESSMENTS:    LANDMARK RIGHT  05/16/2021 RIGHT 02/19/22  10 cm proximal to olecranon process 38.6 37.5  Olecranon process '31 30  10 '$ cm proximal to ulnar styloid process 27 26  Just proximal to ulnar styloid process 19.7 20  Across hand at thumb web space 22 22  At base of 2nd digit 8 7.8  (Blank rows = not tested)   LANDMARK LEFT  05/16/2021 LEFT 02/19/22  10 cm proximal to olecranon process 38 38.5  Olecranon process '31 31  10 '$ cm proximal to ulnar styloid process 26.9 25.5  Just proximal to ulnar styloid process 20.7 19.4  Across hand at thumb web space 21.5 21.7  At base of 2nd digit 7.5 7.5  (Blank rows = not tested)  LYMPHEDEMA ASSESSMENTS:   SURGERY TYPE/DATE: 06/17/21- L br lumpectomy and SLNB; 07/24/21 - bilateral oncoplastic breast reduction; hx of R lumpectomy in 2000 -  non cancerous  NUMBER OF LYMPH NODES REMOVED: 0/3  CHEMOTHERAPY: none  RADIATION:complted on 10/16/21  HORMONE TREATMENT: began 10/2021- on Tamoxifen  INFECTIONS: none   FUNCTIONAL TESTS:  Breast Complaints  Scale-  46     L-DEX LYMPHEDEMA SCREENING:  The patient was assessed using the L-Dex machine today to produce a lymphedema index baseline score. The patient will be reassessed on a regular basis (typically every 3 months) to obtain new L-Dex scores. If the score is > 6.5 points away from his/her baseline score indicating onset of subclinical lymphedema, it will be recommended to wear a compression garment for 4 weeks, 12 hours per day and then be reassessed. If the score continues to be > 6.5 points from baseline at reassessment, we will initiate lymphedema treatment. Assessing in this manner has a 95% rate of preventing clinically significant lymphedema.     TODAY'S TREATMENT:                                                                                                                                         DATE:  05/28/22: Manual Therapy MLD In supine: Short neck, superficial and deep abdominals, Rt axillary nodes, Rt intact upper quadrant sequence and establishment of anterior inter-axillary pathway, Lt inguinal nodes and establishment of Lt axillo-inguinal pathway, then Lt breast moving fluid towards pathways spending extra time in area of fibrosis at medial breast then retracing all steps. Created new chip pack for pt to wear in her bra.   04/23/22: Manual Therapy MLD In supine: Short neck, superficial and deep abdominals, Rt axillary nodes, Rt intact upper quadrant sequence and establishment of anterior inter-axillary pathway, Lt inguinal nodes and establishment of Lt axillo-inguinal pathway, then Lt breast moving fluid towards pathways spending extra time in area of fibrosis at medial breast then retracing all steps.  PROM of Lt shoulder in to flexion and abduction with  some tightness in inferior breast  04/21/22: Manual Therapy MLD In supine: Short neck, superficial and deep abdominals, Rt axillary nodes, Rt intact upper quadrant sequence and establishment of anterior inter-axillary pathway, Lt inguinal nodes and establishment of Lt axillo-inguinal pathway, then Lt breast moving fluid towards pathways spending extra time in area of fibrosis at medial breast then retracing all steps.  PROM of Lt shoulder in to flexion and abduction with some tightness in inferior breast STM to scar tissue in L inferior breast to decrease scar tissue and to SLNB scar/axillary area of tightness  04/16/22: Manual Therapy MLD In supine: Short neck, superficial and deep abdominals, Rt axillary nodes, Rt intact upper quadrant sequence and establishment of anterior inter-axillary pathway, Lt inguinal nodes and establishment of Lt axillo-inguinal pathway, then Lt breast moving fluid towards pathways spending extra time in area of fibrosis at medial breast then retracing all steps.  PROM of Lt shoulder in to flexion and abduction with some tightness in inferior breast STM to scar tissue in L inferior breast to decrease scar tissue and to SLNB scar/axillary area of tightness  04/09/22: Manual Therapy MLD In supine: Short neck, superficial and deep abdominals, Rt axillary nodes, Rt  intact upper quadrant sequence and establishment of anterior inter-axillary pathway, Lt inguinal nodes and establishment of Lt axillo-inguinal pathway, then Lt breast moving fluid towards pathways spending extra time in area of fibrosis at medial breast then retracing all steps.  PROM in to flexion and abduction with some tightness in inferior breast STM to scar tissue in L inferior breast to decrease scar tissue and to SLNB scar  04/08/22: Manual Therapy MLD In supine: Short neck, superficial and deep abdominals, Rt axillary nodes, Rt intact upper quadrant sequence and establishment of anterior inter-axillary  pathway, Lt inguinal nodes and establishment of Lt axillo-inguinal pathway, then Lt breast moving fluid towards pathways spending extra time in area of fibrosis at medial breast then retracing all steps.  PROM in to flexion and abduction with some tightness in inferior breast STM to scar tissue in L inferior breast to decrease scar tissue     PATIENT EDUCATION:  Education details: anatomy and physiology of the lymphatic system, need for compression bra, chip pack, basic principles of MLD Person educated: Patient Education method: Explanation Education comprehension: verbalized understanding  HOME EXERCISE PROGRAM: Wear compression bra as much as possible  ASSESSMENT:  CLINICAL IMPRESSION: Assessed pt's progress towards goals in therapy. She has now met all goals for therapy and has been able to independently manage her lymphedema. She will be discharged from skilled PT services at this time.   OBJECTIVE IMPAIRMENTS: decreased knowledge of condition, decreased knowledge of use of DME, decreased ROM, decreased strength, increased edema, increased fascial restrictions, impaired UE functional use, postural dysfunction, and pain.   ACTIVITY LIMITATIONS: sleeping and reach over head  PARTICIPATION LIMITATIONS:  none  PERSONAL FACTORS: Time since onset of injury/illness/exacerbation are also affecting patient's functional outcome.   REHAB POTENTIAL: Good  CLINICAL DECISION MAKING: Stable/uncomplicated  EVALUATION COMPLEXITY: Low  GOALS: Goals reviewed with patient? Yes  SHORT TERM GOALS=LONG TERM GOALS Target date: 03/19/22  Pt will be independent in self MLD for long term management of lymphedema.  Baseline: no knowledge; Pt reports her and her husband are managing but will benefit from further review - 03/26/22; 04/23/22- pt feels independent with this Goal status: MET 04/23/22  2.  Pt will demonstrate decreased pore size and improved skin mobility in L breast to allow improved  comfort.  Baseline: Pores are still present though are less noticeable, and skin is more pliable - 03/26/22, 04/23/22- fibrosis much better, improved skin mobility and smaller pores Goal status: MET 04/23/22  3.  Pt will demonstrate 150 degrees of L shoulder abduction to allow her to reach out to the side.  Baseline: 112; 142 degrees - 03/26/22 Goal status:Progress towards goal  4.  Pt will be independent in a home exericse program for continued stretching and strengthening.  Baseline: Pt is working towards independence with initial HEP, 04/23/22- pt is indep with this Goal status: MET 04/23/22  5. Pt will be able to independently manage her lymphedema at home over the next month to decrease risk of infection.  Goal status: MET 05/28/22   PLAN:  PT FREQUENCY: 2x/week  PT DURATION: 4 weeks  PLANNED INTERVENTIONS: Therapeutic exercises, Therapeutic activity, Patient/Family education, Self Care, Joint mobilization, Orthotic/Fit training, Manual lymph drainage, Compression bandaging, scar mobilization, Taping, Vasopneumatic device, Manual therapy, and Re-evaluation  PLAN FOR NEXT SESSION: reassess breast lymphedema and ROM; STM to lateral trunk, cont and review MLD to L breast,  ROM to L shoulder both PROM and AAROM, give HEP for this and progress prn, compression pump?  Allyson Sabal [place], [place] 05/28/22 10:07 AM  PHYSICAL THERAPY DISCHARGE SUMMARY  Visits from Start of Care: 17  Current functional level related to goals / functional outcomes: All goals met   Remaining deficits: None   Education / Equipment: Lymphedema, compression bra   Patient agrees to discharge. Patient goals were met. Patient is being discharged due to meeting the stated rehab goals.  Allyson Sabal [place], [place] 05/28/22 10:58 AM

## 2022-06-01 ENCOUNTER — Ambulatory Visit
Admission: RE | Admit: 2022-06-01 | Discharge: 2022-06-01 | Disposition: A | Discharge status: Home / Self Care | Payer: BC Managed Care – PPO | Source: Ambulatory Visit | Attending: Adult Health | Admitting: Adult Health

## 2022-06-01 DIAGNOSIS — N6489 Other specified disorders of breast: Secondary | ICD-10-CM

## 2022-06-01 DIAGNOSIS — R922 Inconclusive mammogram: Secondary | ICD-10-CM

## 2022-06-01 DIAGNOSIS — D0512 Intraductal carcinoma in situ of left breast: Secondary | ICD-10-CM

## 2022-06-01 MED ORDER — GADOPICLENOL 0.5 MMOL/ML IV SOLN
10.0000 mL | Freq: Once | INTRAVENOUS | Status: AC | PRN
  Administered 2022-06-01: 10 mL via INTRAVENOUS

## 2022-06-02 ENCOUNTER — Telehealth: Payer: Self-pay | Admitting: Adult Health

## 2022-06-02 ENCOUNTER — Ambulatory Visit: Payer: BC Managed Care – PPO | Admitting: Physical Therapy

## 2022-06-02 NOTE — Telephone Encounter (Signed)
I spoke with Joan Dalton today and we reviewed her breast MRI results.  I discussed with her the need for right axillary ultrasound and possibly a biopsy depending on what the right axillary ultrasound shows.  I placed orders for both and I conveyed the need to go ahead and get this scheduled with Marlon Pel, RN.    The patient verbalized understanding and knows that we will receive a call about when the ultrasound is scheduled at the breast center.  [name], NP 06/02/22 3:52 PM Medical Oncology and Hematology [facility] [place],  [address] Tel. [phone]    Fax. [phone]

## 2022-06-02 NOTE — Telephone Encounter (Signed)
I called patient and left message on machine and was attempting to review her breast MRI results with her which demonstrate the need of a second look ultrasound in the right axilla in order to get a better idea of what is going on in the slightly increased in size lymph node.  I gave her our callback number so we can discuss these results with her and get the ultrasound ordered.   [name], NP 06/02/22 12:19 PM Medical Oncology and Hematology [facility] [place], [place] [address] Tel. [phone]    Fax. [phone]

## 2022-06-03 ENCOUNTER — Telehealth: Payer: Self-pay

## 2022-06-03 NOTE — Telephone Encounter (Signed)
Spoke with patient and made aware of ultrasound, and ultrasound guided biopsy appointments with the breast center. Patient did ask if she should take her blood thinner the day of the appointments and this LPN suggested she call the breast center and ask so she receives the best answer. Patient verbalized understanding.

## 2022-06-09 ENCOUNTER — Ambulatory Visit
Admission: RE | Admit: 2022-06-09 | Discharge: 2022-06-09 | Disposition: A | Discharge status: Home / Self Care | Payer: BC Managed Care – PPO | Source: Ambulatory Visit | Attending: Adult Health | Admitting: Adult Health

## 2022-06-09 ENCOUNTER — Encounter (INDEPENDENT_AMBULATORY_CARE_PROVIDER_SITE_OTHER): Payer: Self-pay | Admitting: Family Medicine

## 2022-06-09 ENCOUNTER — Ambulatory Visit (INDEPENDENT_AMBULATORY_CARE_PROVIDER_SITE_OTHER): Payer: BC Managed Care – PPO | Admitting: Family Medicine

## 2022-06-09 VITALS — BP 141/104 | HR 84 | Temp 98.7°F | Ht 66.0 in | Wt 259.0 lb

## 2022-06-09 DIAGNOSIS — Z6841 Body Mass Index (BMI) 40.0 and over, adult: Secondary | ICD-10-CM

## 2022-06-09 DIAGNOSIS — E559 Vitamin D deficiency, unspecified: Secondary | ICD-10-CM | POA: Diagnosis not present

## 2022-06-09 DIAGNOSIS — E669 Obesity, unspecified: Secondary | ICD-10-CM

## 2022-06-09 DIAGNOSIS — R7303 Prediabetes: Secondary | ICD-10-CM

## 2022-06-09 DIAGNOSIS — R590 Localized enlarged lymph nodes: Secondary | ICD-10-CM

## 2022-06-09 MED ORDER — VITAMIN D (ERGOCALCIFEROL) 1.25 MG (50000 UNIT) PO CAPS: 50000.0000 [IU] | ORAL_CAPSULE | ORAL | 0 refills | Status: DC

## 2022-06-09 NOTE — Progress Notes (Signed)
Chief Complaint:   OBESITY Joan Dalton is here to discuss her progress with her obesity treatment plan along with follow-up of her obesity related diagnoses. Joan Dalton is on the [employer] and states she is following her eating plan approximately 10% of the time. Joan Dalton states she is not exercising.  Today's visit was #: 8 Starting weight: 275 LBS Starting date: 11/05/2021 Today's weight: 259 LBS Today's date: 06/09/2022 Total lbs lost to date: 16 LBS Total lbs lost since last in-office visit: +5 LBS  Interim History: Patient feels she hasn't done as well since last appointment. Has been inconsistent with meal plan.  Has another biopsy planned for today.  She is trying to survive day by day with increased anxiety.  Wants to try to regain some control with her anxiety and meal plan.  She has fallen into skipping meals and then opting for grab and go options.  She is drinking more coffee as well.  She realizes she has done some meal and food prep previously and just fell out of the habit of doing.  Her family has also increased their protein shake intake.   Subjective:   1. Vitamin D deficiency Patient is on prescription vitamin D.  Last vitamin D level was 32.2 in February.  2. Prediabetes Patient's last A1c was 5.7, insulin 9.7.  Patient has minimal carb cravings.  Assessment/Plan:   1. Vitamin D deficiency Refill- Vitamin D, Ergocalciferol, (DRISDOL) 1.25 MG (50000 UNIT) CAPS capsule; Take 1 capsule (50,000 Units total) by mouth every 7 (seven) days.  Dispense: 4 capsule; Refill: 0  2. Prediabetes Check labs in 3 months.  3. BMI 40.0-44.9, adult (Joan Dalton)  4. Obesity with starting BMI of 44.5 Joan Dalton is currently in the action stage of change. As such, her goal is to continue with weight loss efforts. She has agreed to the [employer].   Exercise goals: No exercise has been prescribed at this time.  Behavioral modification strategies: increasing lean protein  intake, meal planning and cooking strategies, keeping healthy foods in the home, and planning for success.  Joan Dalton has agreed to follow-up with our clinic in 3-4 weeks. She was informed of the importance of frequent follow-up visits to maximize her success with intensive lifestyle modifications for her multiple health conditions.   Objective:   Blood pressure (!) 141/104, pulse 84, temperature 98.7 F (37.1 C), height 5\' 6"  (1.676 m), weight 259 lb (117.5 kg), SpO2 99 %. Body mass index is 41.8 kg/m.  General: Cooperative, alert, well developed, in no acute distress. HEENT: Conjunctivae and lids unremarkable. Cardiovascular: Regular rhythm.  Lungs: Normal work of breathing. Neurologic: No focal deficits.   Lab Results  Component Value Date   CREATININE 0.65 05/15/2022   BUN 8 05/15/2022   NA 142 05/15/2022   K 4.3 05/15/2022   CL 106 05/15/2022   CO2 24 05/15/2022   Lab Results  Component Value Date   ALT 21 05/15/2022   AST 17 05/15/2022   ALKPHOS 43 (L) 05/15/2022   BILITOT 0.4 05/15/2022   Lab Results  Component Value Date   HGBA1C 5.7 (H) 05/15/2022   HGBA1C 5.6 11/05/2021   Lab Results  Component Value Date   INSULIN 9.7 05/15/2022   INSULIN 19.4 11/05/2021   Lab Results  Component Value Date   TSH 1.080 11/05/2021   Lab Results  Component Value Date   CHOL 177 05/15/2022   HDL 55 05/15/2022   LDLCALC 106 (H) 05/15/2022   TRIG 84  05/15/2022   Lab Results  Component Value Date   VD25OH 32.2 05/15/2022   VD25OH 17.9 (L) 11/05/2021   Lab Results  Component Value Date   WBC 5.0 11/05/2021   HGB 13.3 11/05/2021   HCT 40.0 11/05/2021   MCV 95 11/05/2021   PLT 266 11/05/2021   No results found for: "IRON", "TIBC", "FERRITIN"  Attestation Statements:   Reviewed by clinician on day of visit: allergies, medications, problem list, medical history, surgical history, family history, social history, and previous encounter notes.  I, Davy Pique,  RMA, am acting as transcriptionist for Coralie Common, MD.  I have reviewed the above documentation for accuracy and completeness, and I agree with the above. - Coralie Common, MD

## 2022-06-12 ENCOUNTER — Other Ambulatory Visit: Payer: Self-pay | Admitting: Adult Health

## 2022-06-12 DIAGNOSIS — N6489 Other specified disorders of breast: Secondary | ICD-10-CM

## 2022-06-25 ENCOUNTER — Telehealth: Payer: Self-pay

## 2022-06-25 NOTE — Telephone Encounter (Signed)
Returned Pt's call regarding live vaccines. Pt reports she is traveling out of the country and needs certain live vaccines. Pt asking if vaccines are OK while on tamoxifen. Advised Pt that per MD, Pt is able to receive live vaccines while on anti-estrogen therapy. Advised Pt to keep rx with her as a carry on in case of lost luggage. Pt verbalized understanding.

## 2022-07-07 ENCOUNTER — Ambulatory Visit (INDEPENDENT_AMBULATORY_CARE_PROVIDER_SITE_OTHER): Payer: BC Managed Care – PPO | Admitting: Family Medicine

## 2022-07-15 ENCOUNTER — Telehealth: Payer: Self-pay | Admitting: Physical Therapy

## 2022-07-15 NOTE — Telephone Encounter (Signed)
Called to answer pt's questions about compression garments. Had to leave v/m.

## 2022-07-24 ENCOUNTER — Ambulatory Visit (INDEPENDENT_AMBULATORY_CARE_PROVIDER_SITE_OTHER): Payer: BC Managed Care – PPO | Admitting: Family Medicine

## 2022-07-31 ENCOUNTER — Encounter: Payer: Self-pay | Admitting: Hematology and Oncology

## 2022-07-31 ENCOUNTER — Encounter: Payer: Self-pay | Admitting: *Deleted

## 2022-07-31 ENCOUNTER — Inpatient Hospital Stay: Payer: BC Managed Care – PPO | Attending: Hematology and Oncology | Admitting: Hematology and Oncology

## 2022-07-31 ENCOUNTER — Other Ambulatory Visit: Payer: Self-pay

## 2022-07-31 VITALS — BP 150/94 | HR 89 | Temp 98.1°F | Resp 16 | Wt 271.0 lb

## 2022-07-31 DIAGNOSIS — D0511 Intraductal carcinoma in situ of right breast: Secondary | ICD-10-CM | POA: Insufficient documentation

## 2022-07-31 DIAGNOSIS — Z86718 Personal history of other venous thrombosis and embolism: Secondary | ICD-10-CM | POA: Diagnosis not present

## 2022-07-31 DIAGNOSIS — Z7901 Long term (current) use of anticoagulants: Secondary | ICD-10-CM | POA: Diagnosis not present

## 2022-07-31 DIAGNOSIS — Z79810 Long term (current) use of selective estrogen receptor modulators (SERMs): Secondary | ICD-10-CM | POA: Insufficient documentation

## 2022-07-31 DIAGNOSIS — Z803 Family history of malignant neoplasm of breast: Secondary | ICD-10-CM | POA: Insufficient documentation

## 2022-07-31 DIAGNOSIS — D0512 Intraductal carcinoma in situ of left breast: Secondary | ICD-10-CM

## 2022-07-31 DIAGNOSIS — Z8042 Family history of malignant neoplasm of prostate: Secondary | ICD-10-CM | POA: Insufficient documentation

## 2022-07-31 DIAGNOSIS — Z800 Family history of malignant neoplasm of digestive organs: Secondary | ICD-10-CM | POA: Diagnosis not present

## 2022-07-31 DIAGNOSIS — Z923 Personal history of irradiation: Secondary | ICD-10-CM | POA: Insufficient documentation

## 2022-07-31 DIAGNOSIS — Z86711 Personal history of pulmonary embolism: Secondary | ICD-10-CM | POA: Diagnosis not present

## 2022-07-31 MED ORDER — APIXABAN 5 MG PO TABS: 5.0000 mg | ORAL_TABLET | Freq: Two times a day (BID) | ORAL | 4 refills | Status: DC

## 2022-07-31 NOTE — Progress Notes (Signed)
[facility] CONSULT NOTE  Patient Care Team: Ollen Bowl, MD as PCP - General (Internal Medicine) Rachel Moulds, MD as Consulting Physician (Hematology and Oncology) Griselda Miner, MD as Consulting Physician (General Surgery) Dorothy Puffer, MD as Consulting Physician (Radiation Oncology) Dillingham, Alena Bills, DO as Attending Physician (Plastic Surgery)  CHIEF COMPLAINTS/PURPOSE OF CONSULTATION:  Breast cancer  HISTORY OF PRESENTING ILLNESS:  Joan Dalton 47 y.o. female is here because of recent diagnosis of left breast cancer  I reviewed her records extensively and collaborated the history with the patient.  SUMMARY OF ONCOLOGIC HISTORY: Oncology History  Ductal carcinoma in situ (DCIS) of left breast  05/20/2021 Initial Diagnosis   Ductal carcinoma in situ (DCIS) of left breast   06/05/2021 Genetic Testing   Negative genetic testing on the CancerNext-Expanded+RNAinsight panel.  The report date is June 05, 2021.  The CancerNext-Expanded gene panel offered by [facility] and includes sequencing and rearrangement analysis for the following 77 genes: AIP, ALK, APC*, ATM*, AXIN2, BAP1, BARD1, BLM, BMPR1A, BRCA1*, BRCA2*, BRIP1*, CDC73, CDH1*, CDK4, CDKN1B, CDKN2A, CHEK2*, CTNNA1, DICER1, FANCC, FH, FLCN, GALNT12, KIF1B, LZTR1, MAX, MEN1, MET, MLH1*, MSH2*, MSH3, MSH6*, MUTYH*, NBN, NF1*, NF2, NTHL1, PALB2*, PHOX2B, PMS2*, POT1, PRKAR1A, PTCH1, PTEN*, RAD51C*, RAD51D*, RB1, RECQL, RET, SDHA, SDHAF2, SDHB, SDHC, SDHD, SMAD4, SMARCA4, SMARCB1, SMARCE1, STK11, SUFU, TMEM127, TP53*, TSC1, TSC2, VHL and XRCC2 (sequencing and deletion/duplication); EGFR, EGLN1, HOXB13, KIT, MITF, PDGFRA, POLD1, and POLE (sequencing only); EPCAM and GREM1 (deletion/duplication only). DNA and RNA analyses performed for * genes.    06/17/2021 Surgery   Patient had left breast lumpectomy which showed high-grade ductal carcinoma in situ with necrosis and calcifications, resection margins are  negative for carcinoma left additional superior margin excision again negative for carcinoma.  She had 3 out of 3 lymph nodes without involvement.  Prior prognostic showed ER 85% positive strong staining PR 95% positive strong staining. She is now undergoing plastic surgery, had right and left breast mammoplasty with no evidence of malignancy.   06/17/2021 Cancer Staging   Staging form: Breast, AJCC 8th Edition - Clinical stage from 06/17/2021: Stage 0 (cTis (DCIS), cN0, cM0, ER+, PR+) - Signed by Loa Socks, NP on 01/30/2022   08/28/2021 - 10/16/2021 Radiation Therapy   Site Technique Total Dose (Gy) Dose per Fx (Gy) Completed Fx Beam Energies  Breast, Left: Breast_L 3D 50.4/50.4 1.8 28/28 10XFFF  Breast, Left: Breast_L_Bst 3D 10/10 2 5/5 6X, 10X     10/2021 -  Anti-estrogen oral therapy   Tamoxifen    Patient is here for follow-up while on tamoxifen and Eliquis. Since last visit, she has some hot flashes. She was planning a trip to [place] and she wondered if she can take the yellow fever vaccine. She had mammogram, MRI breast and [place] and no evidence of abnormalities. She also had a CT in Jan 2024 which may have found a splenule vs uncertain significance Rest of the pertinent 10 point ROS reviewed and negative  MEDICAL HISTORY:  Past Medical History:  Diagnosis Date   Anemia    Anxiety    Bilateral swelling of feet    Breast cancer (HCC) 05/08/2021   Constipation    Family history of breast cancer    Family history of colon cancer    Family history of pancreatic cancer    Fatty liver    Hx of blood clots    Hypertension    Joint pain    Joint pain  Lactose intolerance    Palpitations    Personal history of radiation therapy    Vitamin D deficiency     SURGICAL HISTORY: Past Surgical History:  Procedure Laterality Date   ABLATION     D&C Ablation of Uterus   AXILLARY SENTINEL NODE BIOPSY Left 06/17/2021   Procedure: LEFT AXILLARY SENTINEL NODE BIOPSY;   Surgeon: Griselda Miner, MD;  Location: [facility] OR;  Service: General;  Laterality: Left;   BREAST BIOPSY Left 05/08/2021   BREAST BIOPSY Right 05/07/2022   [place] RT BREAST BX W LOC DEV 1ST LESION IMG BX SPEC US GUIDE 05/07/2022 [facility] M[facility]MOGR[facility]   BREAST LUMPECTOMY Right 10/1998   BREAST LUMPECTOMY WITH RADIOACTIVE SEED AND SENTINEL LYMPH NODE BIOPSY Left 06/17/2021   Procedure: LEFT BREAST LUMPECTOMY WITH RADIOACTIVE SEEDS x 2;  Surgeon: Griselda Miner, MD;  Location: [facility] OR;  Service: General;  Laterality: Left;   BREAST REDUCTION SURGERY Bilateral 07/24/2021   Procedure: BILATERAL ONCOPLASTIC BREAST REDUCTION;  Surgeon: Peggye Form, DO;  Location: [facility] OR;  Service: Plastics;  Laterality: Bilateral;   MASTOPEXY Bilateral 07/24/2021   Procedure: MASTOPEXY;  Surgeon: Peggye Form, DO;  Location: [facility] OR;  Service: Plastics;  Laterality: Bilateral;    SOCIAL HISTORY: Social History   Socioeconomic History   Marital status: Married    Spouse name: [name]   Number of children: 5   Years of education: Not on file   Highest education level: Not on file  Occupational History   Not on file  Tobacco Use   Smoking status: Never   Smokeless tobacco: Never  Vaping Use   Vaping Use: Never used  Substance and Sexual Activity   Alcohol use: Never   Drug use: Never   Sexual activity: Yes  Other Topics Concern   Not on file  Social History Narrative   Not on file   Social Determinants of Health   Financial Resource Strain: Not on file  Food Insecurity: Not on file  Transportation Needs: Not on file  Physical Activity: Not on file  Stress: Not on file  Social Connections: Not on file  Intimate Partner Violence: Not At Risk (05/21/2021)   Humiliation, Afraid, Rape, and Kick questionnaire    Fear of Current or Ex-Partner: No    Emotionally Abused: No    Physically Abused: No    Sexually Abused: No    FAMILY HISTORY: Family History  Problem Relation Age of Onset    Breast cancer Mother        4s & 62s   Hypertension Father    Hyperlipidemia Father    Diabetes Father    Prostate cancer Father    Heart disease Father    Sleep apnea Father    Obesity Father    Colon cancer Maternal Grandfather    Breast cancer Maternal Aunt 52   Pancreatic cancer Maternal Aunt 61   Breast cancer Maternal Aunt        later 30s-early 64s   Colon cancer Maternal Aunt 42   Colon cancer Maternal Uncle        60s   Pancreatic cancer Cousin        mother's maternal first cousin   Pancreatic cancer Cousin        mother's maternal first cousin    ALLERGIES:  is allergic to sulfamethoxazole-trimethoprim.  MEDICATIONS:  Current Outpatient Medications  Medication Sig Dispense Refill   apixaban (ELIQUIS) 5 MG TABS tablet Take 1 tablet (5  mg total) by mouth 2 (two) times daily. 60 tablet 4   Multiple Vitamin (MULTI-VITAMIN) tablet Take 1 tablet by mouth daily.     tamoxifen (NOLVADEX) 20 MG tablet Take 1 tablet (20 mg total) by mouth daily. 90 tablet 1   Vitamin D, Ergocalciferol, (DRISDOL) 1.25 MG (50000 UNIT) CAPS capsule Take 1 capsule (50,000 Units total) by mouth every 7 (seven) days. 4 capsule 0   No current facility-administered medications for this visit.     PHYSICAL EXAMINATION: ECOG PERFORMANCE STATUS: 0 - Asymptomatic  Vitals:   07/31/22 0946  BP: (!) 150/94  Pulse: 89  Resp: 16  Temp: 98.1 F (36.7 C)  SpO2: 96%    Filed Weights   07/31/22 0946  Weight: 271 lb (122.9 kg)    Physical Exam Constitutional:      Appearance: Normal appearance.  Chest:     Comments: Bilateral breasts inspected Both nipples appear crusted. Left breast with post radiation changes and Peau D orange appearance of the left breast inner lower quadrant. No other palpable masses or regional adenopathy Neurological:     Mental Status: She is alert.      LABORATORY DATA:  I have reviewed the data as listed Lab Results  Component Value Date   WBC 5.0  11/05/2021   HGB 13.3 11/05/2021   HCT 40.0 11/05/2021   MCV 95 11/05/2021   PLT 266 11/05/2021   Lab Results  Component Value Date   NA 142 05/15/2022   K 4.3 05/15/2022   CL 106 05/15/2022   CO2 24 05/15/2022    RADIOGRAPHIC STUDIES: I have personally reviewed the radiological reports and agreed with the findings in the report.  ASSESSMENT AND PLAN:   This is a very pleasant 47 year old female patient (menopausal status unknown, last period Several years ago but had endometrial ablation) referred to hematology/oncology for DCIS of the right breast and recommendations regarding the above.  She has now completed adjuvant radiation and is here to once again discuss options for antiestrogen therapy.  In the past we have discussed multiple options for antiestrogen therapy.  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 Most recent imaging review, mammogram right breast ordered for August. No contraindication to yellow fever vaccine. Repeat CT abdomen ordered in May for follow up of ? Splenule. RTC in 6 months.  Total time spent 30 minutes including review of history, records, counseling about antiestrogen therapy options, coordination of care  Thank you for consulting [place] in the care of this patient.  Please not hesitate to contact us with any additional questions or concerns.

## 2022-08-14 ENCOUNTER — Ambulatory Visit (HOSPITAL_COMMUNITY)
Admission: RE | Admit: 2022-08-14 | Discharge: 2022-08-14 | Disposition: A | Discharge status: Home / Self Care | Payer: BC Managed Care – PPO | Source: Ambulatory Visit | Attending: Internal Medicine | Admitting: Internal Medicine

## 2022-08-14 ENCOUNTER — Other Ambulatory Visit (HOSPITAL_COMMUNITY): Payer: Self-pay | Admitting: Sports Medicine

## 2022-08-14 DIAGNOSIS — M79661 Pain in right lower leg: Secondary | ICD-10-CM | POA: Diagnosis present

## 2022-08-14 DIAGNOSIS — M7989 Other specified soft tissue disorders: Secondary | ICD-10-CM | POA: Diagnosis present

## 2022-08-21 ENCOUNTER — Ambulatory Visit (HOSPITAL_COMMUNITY)
Admission: RE | Admit: 2022-08-21 | Discharge: 2022-08-21 | Disposition: A | Discharge status: Home / Self Care | Payer: BC Managed Care – PPO | Source: Ambulatory Visit | Attending: Hematology and Oncology | Admitting: Hematology and Oncology

## 2022-08-21 ENCOUNTER — Encounter (HOSPITAL_COMMUNITY): Payer: Self-pay | Admitting: Radiology

## 2022-08-21 DIAGNOSIS — D0512 Intraductal carcinoma in situ of left breast: Secondary | ICD-10-CM | POA: Insufficient documentation

## 2022-08-21 MED ORDER — IOHEXOL 300 MG/ML  SOLN
100.0000 mL | Freq: Once | INTRAMUSCULAR | Status: AC | PRN
  Administered 2022-08-21: 100 mL via INTRAVENOUS

## 2022-10-28 ENCOUNTER — Ambulatory Visit (INDEPENDENT_AMBULATORY_CARE_PROVIDER_SITE_OTHER): Payer: BC Managed Care – PPO | Admitting: Family Medicine

## 2022-10-28 ENCOUNTER — Encounter (INDEPENDENT_AMBULATORY_CARE_PROVIDER_SITE_OTHER): Payer: Self-pay | Admitting: Family Medicine

## 2022-10-28 VITALS — BP 138/84 | HR 94 | Temp 98.1°F | Ht 66.0 in | Wt 278.0 lb

## 2022-10-28 DIAGNOSIS — F5089 Other specified eating disorder: Secondary | ICD-10-CM | POA: Diagnosis not present

## 2022-10-28 DIAGNOSIS — E669 Obesity, unspecified: Secondary | ICD-10-CM | POA: Diagnosis not present

## 2022-10-28 DIAGNOSIS — E559 Vitamin D deficiency, unspecified: Secondary | ICD-10-CM

## 2022-10-28 DIAGNOSIS — R7303 Prediabetes: Secondary | ICD-10-CM

## 2022-10-28 DIAGNOSIS — Z6841 Body Mass Index (BMI) 40.0 and over, adult: Secondary | ICD-10-CM

## 2022-10-28 MED ORDER — LOMAIRA 8 MG PO TABS: ORAL_TABLET | ORAL | 0 refills | Status: DC

## 2022-10-28 MED ORDER — VITAMIN D (ERGOCALCIFEROL) 1.25 MG (50000 UNIT) PO CAPS: 50000.0000 [IU] | ORAL_CAPSULE | ORAL | 0 refills | Status: DC

## 2022-10-28 MED ORDER — TOPIRAMATE 25 MG PO TABS: ORAL_TABLET | ORAL | 0 refills | Status: DC

## 2022-10-28 NOTE — Progress Notes (Signed)
Chief Complaint:   OBESITY Joan Dalton is here to discuss her progress with her obesity treatment plan along with follow-up of her obesity related diagnoses. Joan Dalton is on the [employer] and states she is following her eating plan approximately 0% of the time. Joan Dalton states she is walking for 45 minutes 2 times per week.  Today's visit was #: 9 Starting weight: 275 lbs Starting date: 11/05/2021 Today's weight: 278 lbs Today's date: 10/28/2022 Total lbs lost to date: 0 Total lbs lost since last in-office visit: 0  Interim History: Patient returns for first appointment since March and she went to [place] since then.  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  She is wanting to set a new schedule and routine prior to work picking back up.    Subjective:   1. Prediabetes Patient's last A1c was 5.7 but that was in February.   2. Vitamin D deficiency Patient's last Vitamin D level was of 32.2. She denies nausea, vomiting, or muscle weakness, but notes fatigue.   3. Other disorder of eating Patient is interested in medication for obesity. Joan Dalton is not covered by her insurance. Her starting weights is 278 lbs (5% would be 13.5 lbs). Controlled substance contract was signed, and PDMP was checked with no concerns.   Assessment/Plan:   1. Prediabetes We will repeat fasting labs at Patient's next appointment.   2. Vitamin D deficiency Patient will continue prescription Vitamin D once weekly, and we will refill for 1 month.   - Vitamin D, Ergocalciferol, (DRISDOL) 1.25 MG (50000 UNIT) CAPS capsule; Take 1 capsule (50,000 Units total) by mouth every 7 (seven) days.  Dispense: 4 capsule; Refill: 0  3. Other disorder of eating Patient agreed to start Lomaira at 4 mg and start topiramate at 25 mg with no refills.   - Phentermine HCl (LOMAIRA) 8 MG TABS; Take 0.5 tablets (4 mg total) by mouth daily for 14 days, THEN 1 tablet (8 mg  total) daily for 14 days.  Dispense: 23 tablet; Refill: 0 - topiramate (TOPAMAX) 25 MG tablet; Take 1 tablet (25 mg total) by mouth daily for 14 days, THEN 2 tablets (50 mg total) daily for 14 days.  Dispense: 45 tablet; Refill: 0  4. BMI 40.0-44.9, adult (HCC)  5. Obesity with starting BMI of 44.5 Joan Dalton is currently in the action stage of change. As such, her goal is to continue with weight loss efforts. She has agreed to the [employer].   We will repeat fasting labs at her next appointment.   Exercise goals: All adults should avoid inactivity. Some physical activity is better than none, and adults who participate in any amount of physical activity gain some health benefits.  Behavioral modification strategies: increasing lean protein intake, meal planning and cooking strategies, keeping healthy foods in the home, avoiding temptations, and planning for success.  Joan Dalton has agreed to follow-up with our clinic in 3 weeks. She was informed of the importance of frequent follow-up visits to maximize her success with intensive lifestyle modifications for her multiple health conditions.   Objective:   Blood pressure 138/84, pulse 94, temperature 98.1 F (36.7 C), height 5\' 6"  (1.676 m), weight 278 lb (126.1 kg), SpO2 98%. Body mass index is 44.87 kg/m.  General: Cooperative, alert, well developed, in no acute distress. HEENT: Conjunctivae and lids unremarkable. Cardiovascular: Regular rhythm.  Lungs: Normal work of breathing. Neurologic: No focal deficits.   Lab Results  Component Value Date   CREATININE 0.65 05/15/2022   BUN 8 05/15/2022   NA 142 05/15/2022   K 4.3 05/15/2022   CL 106 05/15/2022   CO2 24 05/15/2022   Lab Results  Component Value Date   ALT 21 05/15/2022   AST 17 05/15/2022   ALKPHOS 43 (L) 05/15/2022   BILITOT 0.4 05/15/2022   Lab Results  Component Value Date   HGBA1C 5.7 (H) 05/15/2022   HGBA1C 5.6 11/05/2021   Lab Results  Component  Value Date   INSULIN 9.7 05/15/2022   INSULIN 19.4 11/05/2021   Lab Results  Component Value Date   TSH 1.080 11/05/2021   Lab Results  Component Value Date   CHOL 177 05/15/2022   HDL 55 05/15/2022   LDLCALC 106 (H) 05/15/2022   TRIG 84 05/15/2022   Lab Results  Component Value Date   VD25OH 32.2 05/15/2022   VD25OH 17.9 (L) 11/05/2021   Lab Results  Component Value Date   WBC 5.0 11/05/2021   HGB 13.3 11/05/2021   HCT 40.0 11/05/2021   MCV 95 11/05/2021   PLT 266 11/05/2021   No results found for: "IRON", "TIBC", "FERRITIN"  Attestation Statements:   Reviewed by clinician on day of visit: allergies, medications, problem list, medical history, surgical history, family history, social history, and previous encounter notes.   I, Burt Knack, am acting as transcriptionist for Joan Likes, MD.  I have reviewed the above documentation for accuracy and completeness, and I agree with the above. - Joan Likes, MD

## 2022-10-31 ENCOUNTER — Ambulatory Visit: Admission: RE | Admit: 2022-10-31 | Payer: BC Managed Care – PPO | Source: Ambulatory Visit

## 2022-10-31 ENCOUNTER — Ambulatory Visit
Admission: RE | Admit: 2022-10-31 | Discharge: 2022-10-31 | Disposition: A | Discharge status: Home / Self Care | Payer: BC Managed Care – PPO | Source: Ambulatory Visit | Attending: Adult Health | Admitting: Adult Health

## 2022-10-31 ENCOUNTER — Other Ambulatory Visit: Payer: Self-pay | Admitting: Adult Health

## 2022-10-31 DIAGNOSIS — N6489 Other specified disorders of breast: Secondary | ICD-10-CM

## 2022-11-17 ENCOUNTER — Ambulatory Visit (INDEPENDENT_AMBULATORY_CARE_PROVIDER_SITE_OTHER): Payer: BC Managed Care – PPO | Admitting: Family Medicine

## 2022-11-17 ENCOUNTER — Encounter (INDEPENDENT_AMBULATORY_CARE_PROVIDER_SITE_OTHER): Payer: Self-pay | Admitting: Family Medicine

## 2022-11-17 VITALS — BP 132/88 | HR 86 | Temp 98.5°F | Ht 66.0 in | Wt 273.0 lb

## 2022-11-17 DIAGNOSIS — Z6841 Body Mass Index (BMI) 40.0 and over, adult: Secondary | ICD-10-CM

## 2022-11-17 DIAGNOSIS — F5089 Other specified eating disorder: Secondary | ICD-10-CM | POA: Diagnosis not present

## 2022-11-17 DIAGNOSIS — E7849 Other hyperlipidemia: Secondary | ICD-10-CM | POA: Diagnosis not present

## 2022-11-17 DIAGNOSIS — E669 Obesity, unspecified: Secondary | ICD-10-CM

## 2022-11-17 DIAGNOSIS — R7303 Prediabetes: Secondary | ICD-10-CM

## 2022-11-17 DIAGNOSIS — E559 Vitamin D deficiency, unspecified: Secondary | ICD-10-CM

## 2022-11-17 MED ORDER — LOMAIRA 8 MG PO TABS: ORAL_TABLET | ORAL | 0 refills | Status: DC

## 2022-11-17 MED ORDER — TOPIRAMATE 50 MG PO TABS: 50.0000 mg | ORAL_TABLET | Freq: Every day | ORAL | 0 refills | Status: DC

## 2022-11-17 MED ORDER — VITAMIN D (ERGOCALCIFEROL) 1.25 MG (50000 UNIT) PO CAPS: 50000.0000 [IU] | ORAL_CAPSULE | ORAL | 0 refills | Status: DC

## 2022-11-17 MED ORDER — TOPIRAMATE 25 MG PO TABS: ORAL_TABLET | ORAL | 0 refills | Status: DC

## 2022-11-17 NOTE — Progress Notes (Signed)
Chief Complaint:   OBESITY Joan Dalton is here to discuss her progress with her obesity treatment plan along with follow-up of her obesity related diagnoses. Joan Dalton is on the [employer] and states she is following her eating plan approximately 50% of the time. 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  Today's visit was #: 10 Starting weight: 275 lbs Starting date: 11/05/2021 Today's weight: 273 lbs Today's date: 11/17/2022 Total lbs lost to date: 2 Total lbs lost since last in-office visit: 5  Interim History: She has tried to implement the program since last appointment.  She never got the [facility] and has only taken the topiramate. She is going to be on the road for the holiday weekend.  She is not noticing much change on food intake amount.   Subjective:   1. Prediabetes Patient's last A1c was in February 2024, and was of 5.7.  She is not on medications.  2. Vitamin D deficiency Patient is on prescription vitamin D.  She denies nausea, vomiting, or muscle weakness but notes fatigue.  3. Other hyperlipidemia Patient's last LDL was 106, HDL 55, and triglycerides 84.  She is not on medications.  4. Other disorder of eating Patient is on topiramate (patient has not started Lomaira yet due to not getting her prescription from the pharmacy).  Her starting weight was of 278 pounds at her last appointment  Assessment/Plan:   1. Prediabetes We will check labs today, and we will follow-up at patient's next appointment.  - Comprehensive metabolic panel - Hemoglobin A1c - Insulin, random  2. Vitamin D deficiency We will check labs today, and we will follow-up at her next appointment.  Patient will continue prescription vitamin D, and we will refill for 1 month.  - VITAMIN D 25 Hydroxy (Vit-D Deficiency, Fractures) - Vitamin D, Ergocalciferol, (DRISDOL) 1.25 MG (50000 UNIT) CAPS capsule; Take 1 capsule (50,000 Units total) by  mouth every 7 (seven) days.  Dispense: 4 capsule; Refill: 0  3. Other hyperlipidemia We will check labs today, we will follow-up at patient's next appointment.  - Lipid Panel With LDL/HDL Ratio  4. Other disorder of eating Patient will continue her medications, and we will refill topiramate 50 mg and Lomaira 4 mg (and finally start) for 1 month.  - Phentermine HCl (LOMAIRA) 8 MG TABS; Take 0.5 tablets (4 mg total) by mouth daily for 14 days, THEN 1 tablet (8 mg total) daily for 14 days.  Dispense: 23 tablet; Refill: 0 - topiramate (TOPAMAX) 50 MG tablet; Take 1 tablet (50 mg total) by mouth daily.  Dispense: 30 tablet; Refill: 0  5. BMI 40.0-44.9, adult (HCC)  6. Obesity with starting BMI of 44.5 Joan Dalton is currently in the action stage of change. As such, her goal is to continue with weight loss efforts. She has agreed to the [employer].   Exercise goals: All adults should avoid inactivity. Some physical activity is better than none, and adults who participate in any amount of physical activity gain some health benefits.  Behavioral modification strategies: increasing lean protein intake, meal planning and cooking strategies, keeping healthy foods in the home, travel eating strategies, and planning for success.  Joan Dalton has agreed to follow-up with our clinic in 4 weeks. She was informed of the importance of frequent follow-up visits to maximize her success with intensive lifestyle modifications for her multiple health conditions.   Objective:   Blood pressure 132/88, pulse 86, temperature 98.5  F (36.9 C), height 5\' 6"  (1.676 m), weight 273 lb (123.8 kg), SpO2 99%. Body mass index is 44.06 kg/m.  General: Cooperative, alert, well developed, in no acute distress. HEENT: Conjunctivae and lids unremarkable. Cardiovascular: Regular rhythm.  Lungs: Normal work of breathing. Neurologic: No focal deficits.   Lab Results  Component Value Date   CREATININE 0.65 05/15/2022    BUN 8 05/15/2022   NA 142 05/15/2022   K 4.3 05/15/2022   CL 106 05/15/2022   CO2 24 05/15/2022   Lab Results  Component Value Date   ALT 21 05/15/2022   AST 17 05/15/2022   ALKPHOS 43 (L) 05/15/2022   BILITOT 0.4 05/15/2022   Lab Results  Component Value Date   HGBA1C 5.7 (H) 05/15/2022   HGBA1C 5.6 11/05/2021   Lab Results  Component Value Date   INSULIN 9.7 05/15/2022   INSULIN 19.4 11/05/2021   Lab Results  Component Value Date   TSH 1.080 11/05/2021   Lab Results  Component Value Date   CHOL 177 05/15/2022   HDL 55 05/15/2022   LDLCALC 106 (H) 05/15/2022   TRIG 84 05/15/2022   Lab Results  Component Value Date   VD25OH 32.2 05/15/2022   VD25OH 17.9 (L) 11/05/2021   Lab Results  Component Value Date   WBC 5.0 11/05/2021   HGB 13.3 11/05/2021   HCT 40.0 11/05/2021   MCV 95 11/05/2021   PLT 266 11/05/2021   No results found for: "IRON", "TIBC", "FERRITIN"  Attestation Statements:   Reviewed by clinician on day of visit: allergies, medications, problem list, medical history, surgical history, family history, social history, and previous encounter notes.   I, Burt Knack, am acting as transcriptionist for Reuben Likes, MD.  I have reviewed the above documentation for accuracy and completeness, and I agree with the above. - Reuben Likes, MD

## 2022-11-18 ENCOUNTER — Other Ambulatory Visit (INDEPENDENT_AMBULATORY_CARE_PROVIDER_SITE_OTHER): Payer: Self-pay | Admitting: Family Medicine

## 2022-11-18 ENCOUNTER — Telehealth (INDEPENDENT_AMBULATORY_CARE_PROVIDER_SITE_OTHER): Payer: Self-pay | Admitting: Family Medicine

## 2022-11-18 DIAGNOSIS — E559 Vitamin D deficiency, unspecified: Secondary | ICD-10-CM

## 2022-11-18 LAB — LIPID PANEL WITH LDL/HDL RATIO
Cholesterol, Total: 212 mg/dL — ABNORMAL HIGH (ref 100–199)
HDL: 61 mg/dL (ref >39)
LDL Chol Calc (NIH): 126 mg/dL — ABNORMAL HIGH (ref 0–99)
LDL/HDL Ratio: 2.1 ratio (ref 0.0–3.2)
Triglycerides: 143 mg/dL (ref 0–149)
VLDL Cholesterol Cal: 25 mg/dL (ref 5–40)

## 2022-11-18 LAB — COMPREHENSIVE METABOLIC PANEL
ALT: 20 IU/L (ref 0–32)
AST: 16 IU/L (ref 0–40)
Albumin: 4.1 g/dL (ref 3.9–4.9)
Alkaline Phosphatase: 51 IU/L (ref 44–121)
BUN/Creatinine Ratio: 13 (ref 9–23)
BUN: 9 mg/dL (ref 6–24)
Bilirubin Total: 0.3 mg/dL (ref 0.0–1.2)
CO2: 22 mmol/L (ref 20–29)
Calcium: 9.2 mg/dL (ref 8.7–10.2)
Chloride: 105 mmol/L (ref 96–106)
Creatinine, Ser: 0.71 mg/dL (ref 0.57–1.00)
Globulin, Total: 2.6 g/dL (ref 1.5–4.5)
Glucose: 94 mg/dL (ref 70–99)
Potassium: 4.3 mmol/L (ref 3.5–5.2)
Sodium: 141 mmol/L (ref 134–144)
Total Protein: 6.7 g/dL (ref 6.0–8.5)
eGFR: 105 mL/min/{1.73_m2} (ref >59)

## 2022-11-18 LAB — VITAMIN D 25 HYDROXY (VIT D DEFICIENCY, FRACTURES): Vit D, 25-Hydroxy: 24.9 ng/mL — ABNORMAL LOW (ref 30.0–100.0)

## 2022-11-18 LAB — INSULIN, RANDOM: INSULIN: 15 u[IU]/mL (ref 2.6–24.9)

## 2022-11-18 LAB — HEMOGLOBIN A1C
Est. average glucose Bld gHb Est-mCnc: 111 mg/dL
Hgb A1c MFr Bld: 5.5 % (ref 4.8–5.6)

## 2022-11-18 NOTE — Telephone Encounter (Signed)
Ms. Dangel called in stating her pharmacy did not have the Lomaira in stock. [facility] [address] [facility] and would like for someone to call the Lomiara and the Vitamin D  [facility] [address]   Thank you Vickie

## 2022-11-18 NOTE — Telephone Encounter (Signed)
Please advise 

## 2022-11-19 MED ORDER — VITAMIN D (ERGOCALCIFEROL) 1.25 MG (50000 UNIT) PO CAPS: 50000.0000 [IU] | ORAL_CAPSULE | ORAL | 0 refills | Status: DC

## 2022-11-22 ENCOUNTER — Other Ambulatory Visit (INDEPENDENT_AMBULATORY_CARE_PROVIDER_SITE_OTHER): Payer: Self-pay | Admitting: Family Medicine

## 2022-11-22 DIAGNOSIS — E559 Vitamin D deficiency, unspecified: Secondary | ICD-10-CM

## 2022-11-22 DIAGNOSIS — F5089 Other specified eating disorder: Secondary | ICD-10-CM

## 2022-11-22 MED ORDER — VITAMIN D (ERGOCALCIFEROL) 1.25 MG (50000 UNIT) PO CAPS
50000.0000 [IU] | ORAL_CAPSULE | ORAL | 0 refills | Status: AC
Start: 2022-11-22 — End: ?

## 2022-11-22 MED ORDER — LOMAIRA 8 MG PO TABS
ORAL_TABLET | ORAL | 0 refills | Status: AC
Start: 2022-11-22 — End: 2022-12-19

## 2022-12-01 ENCOUNTER — Telehealth (INDEPENDENT_AMBULATORY_CARE_PROVIDER_SITE_OTHER): Payer: Self-pay

## 2022-12-01 NOTE — Telephone Encounter (Signed)
Joan Dalton, Below is the response from your insurance. They have denied coverage of Lomaira. If you would like to still get the prescription, look at the [facility] website for discount information to take to the pharmacy. Or if you would like to discuss other options at your next visit. I hope you have a good day, Joan Dalton, CMA  [facility]  received a request for coverage of Lomaira (phentermine) for you. The [facility] Plan has criteria in place for coverage of this medication. We needed additional clinical information from your provider in order to make a decision to either approve or deny the request. We did not receive the additional clinical information within the time allowed to make the decision; therefore, the request is denied. The request is denied because: Your plan only covers this drug when you meet one of these options: A) You have tried other drugs your plan covers (preferred drugs), and they did not work well for you, or B) Your doctor gives [place] a medical reason you cannot take those other drugs. For your plan, you may need to try up to three preferred drugs. We have denied your request because you do not meet any of these conditions. We reviewed the information we had. Your request has been denied. Your doctor can send [place] any new or missing information for [place] to review. The preferred drugs for your plan are: orlistat, QSYMIA, SAXENDA, WEGOVY, ZEPBOUND. (Requirement: 3 in a class with 3 or more alternatives, 2 in a class with 2 alternatives, or 1 in a class with only 1 alternative.). Your doctor may need to get approval from your plan for preferred drugs. For this drug, you may have to meet other criteria. You can request the drug policy for more details. You can also request other plan documents for your review.

## 2022-12-10 ENCOUNTER — Other Ambulatory Visit (INDEPENDENT_AMBULATORY_CARE_PROVIDER_SITE_OTHER): Payer: Self-pay | Admitting: Family Medicine

## 2022-12-10 DIAGNOSIS — F5089 Other specified eating disorder: Secondary | ICD-10-CM

## 2022-12-16 ENCOUNTER — Telehealth (INDEPENDENT_AMBULATORY_CARE_PROVIDER_SITE_OTHER): Payer: Self-pay | Admitting: Family Medicine

## 2022-12-16 NOTE — Telephone Encounter (Signed)
Spoke w/ pt, pt aware to continue current dose and discuss changes at next appointment. CS

## 2022-12-16 NOTE — Telephone Encounter (Signed)
Pt cld wanting to know if she can take an extra dose of either medications Phentermine HCl (LOMAIRA) 8 MG TABS and topiramate (TOPAMAX) 50 MG tablet. She wants to take the edge off of her hunger. Please call patient at [phone]. [facility].

## 2022-12-17 ENCOUNTER — Ambulatory Visit (INDEPENDENT_AMBULATORY_CARE_PROVIDER_SITE_OTHER): Payer: BC Managed Care – PPO | Admitting: Family Medicine

## 2022-12-22 ENCOUNTER — Ambulatory Visit (INDEPENDENT_AMBULATORY_CARE_PROVIDER_SITE_OTHER): Payer: BC Managed Care – PPO | Admitting: Family Medicine

## 2022-12-22 ENCOUNTER — Encounter (INDEPENDENT_AMBULATORY_CARE_PROVIDER_SITE_OTHER): Payer: Self-pay | Admitting: Family Medicine

## 2022-12-22 VITALS — BP 132/86 | HR 94 | Temp 98.3°F | Ht 66.0 in | Wt 268.0 lb

## 2022-12-22 DIAGNOSIS — Z6841 Body Mass Index (BMI) 40.0 and over, adult: Secondary | ICD-10-CM | POA: Diagnosis not present

## 2022-12-22 DIAGNOSIS — E559 Vitamin D deficiency, unspecified: Secondary | ICD-10-CM | POA: Diagnosis not present

## 2022-12-22 DIAGNOSIS — F5089 Other specified eating disorder: Secondary | ICD-10-CM

## 2022-12-22 DIAGNOSIS — E669 Obesity, unspecified: Secondary | ICD-10-CM | POA: Diagnosis not present

## 2022-12-22 MED ORDER — TOPIRAMATE 50 MG PO TABS: 75.0000 mg | ORAL_TABLET | Freq: Every day | ORAL | 0 refills | Status: DC

## 2022-12-22 MED ORDER — LOMAIRA 8 MG PO TABS
12.0000 mg | ORAL_TABLET | Freq: Every day | ORAL | 0 refills | Status: DC
Start: 2022-12-22 — End: 2023-01-19

## 2022-12-22 MED ORDER — VITAMIN D (ERGOCALCIFEROL) 1.25 MG (50000 UNIT) PO CAPS
50000.0000 [IU] | ORAL_CAPSULE | ORAL | 0 refills | Status: DC
Start: 2022-12-22 — End: 2023-01-19

## 2022-12-22 NOTE — Progress Notes (Signed)
Chief Complaint:   OBESITY Joan Dalton is here to discuss her progress with her obesity treatment plan along with follow-up of her obesity related diagnoses. Joan Dalton is on the [employer] and states she is following her eating plan approximately 60-70% of the time. Joan Dalton states she is biking and walking.    Today's visit was #: 11 Starting weight: 275 lbs Starting date: 11/05/2021 Today's weight: 268 lbs Today's date: 12/22/2022 Total lbs lost to date: 7 Total lbs lost since last in-office visit: 5  Interim History: Patient started on combo lomaira and topiramate at last appointment.  She is noticing more appetite control and is able to stick to meal plan a bit easier.  Noticing some aches and pains are improving with the weight loss she has experienced.  Her birthday is coming up and she is planning to be a bit more indulgent on that day. She wants to stay more in control of choices over her birthday celebration to limit amount of sugar she is eating.   Subjective:   1. Vitamin D deficiency Patient's last vitamin D level was of 24.9.  She denies nausea, vomiting, or muscle weakness but notes fatigue.  Her last labs were done at the end of August.  2. Other disorder of eating Patient is on combination Lomaira and topiramate.  Her starting weight was of 278 pounds, and she is now at 268 pounds.  PDMP was checked with no concerns.  Assessment/Plan:   1. Vitamin D deficiency We will refill vitamin D 50,000 IU once weekly for 1 month.  - Vitamin D, Ergocalciferol, (DRISDOL) 1.25 MG (50000 UNIT) CAPS capsule; Take 1 capsule (50,000 Units total) by mouth every 7 (seven) days.  Dispense: 4 capsule; Refill: 0  2. Other disorder of eating Patient agreed to increase Lomaira to 12 mg and topiramate to 75 mg, and we will refill for 1 month.  - topiramate (TOPAMAX) 50 MG tablet; Take 1.5 tablets (75 mg total) by mouth daily.  Dispense: 45 tablet; Refill: 0 - Phentermine HCl  (LOMAIRA) 8 MG TABS; Take 1.5 tablets (12 mg total) by mouth daily.  Dispense: 45 tablet; Refill: 0  3. BMI 40.0-44.9, adult (HCC)  4. Obesity with starting BMI of 44.5 Joan Dalton is currently in the action stage of change. As such, her goal is to continue with weight loss efforts. She has agreed to the [employer].   Exercise goals: All adults should avoid inactivity. Some physical activity is better than none, and adults who participate in any amount of physical activity gain some health benefits.  Behavioral modification strategies: increasing lean protein intake, meal planning and cooking strategies, keeping healthy foods in the home, and planning for success.  Joan Dalton has agreed to follow-up with our clinic in 4 weeks. She was informed of the importance of frequent follow-up visits to maximize her success with intensive lifestyle modifications for her multiple health conditions.   Objective:   Blood pressure 132/86, pulse 94, temperature 98.3 F (36.8 C), height 5\' 6"  (1.676 m), weight 268 lb (121.6 kg), SpO2 97%. Body mass index is 43.26 kg/m.  General: Cooperative, alert, well developed, in no acute distress. HEENT: Conjunctivae and lids unremarkable. Cardiovascular: Regular rhythm.  Lungs: Normal work of breathing. Neurologic: No focal deficits.   Lab Results  Component Value Date   CREATININE 0.71 11/17/2022   BUN 9 11/17/2022   NA 141 11/17/2022   K 4.3 11/17/2022   CL 105 11/17/2022   CO2 22 11/17/2022  Lab Results  Component Value Date   ALT 20 11/17/2022   AST 16 11/17/2022   ALKPHOS 51 11/17/2022   BILITOT 0.3 11/17/2022   Lab Results  Component Value Date   HGBA1C 5.5 11/17/2022   HGBA1C 5.7 (H) 05/15/2022   HGBA1C 5.6 11/05/2021   Lab Results  Component Value Date   INSULIN 15.0 11/17/2022   INSULIN 9.7 05/15/2022   INSULIN 19.4 11/05/2021   Lab Results  Component Value Date   TSH 1.080 11/05/2021   Lab Results  Component Value Date    CHOL 212 (H) 11/17/2022   HDL 61 11/17/2022   LDLCALC 126 (H) 11/17/2022   TRIG 143 11/17/2022   Lab Results  Component Value Date   VD25OH 24.9 (L) 11/17/2022   VD25OH 32.2 05/15/2022   VD25OH 17.9 (L) 11/05/2021   Lab Results  Component Value Date   WBC 5.0 11/05/2021   HGB 13.3 11/05/2021   HCT 40.0 11/05/2021   MCV 95 11/05/2021   PLT 266 11/05/2021   No results found for: "IRON", "TIBC", "FERRITIN"  Attestation Statements:   Reviewed by clinician on day of visit: allergies, medications, problem list, medical history, surgical history, family history, social history, and previous encounter notes.   I, Burt Knack, am acting as transcriptionist for Reuben Likes, MD.  I have reviewed the above documentation for accuracy and completeness, and I agree with the above. - Reuben Likes, MD

## 2023-01-13 ENCOUNTER — Other Ambulatory Visit (INDEPENDENT_AMBULATORY_CARE_PROVIDER_SITE_OTHER): Payer: Self-pay | Admitting: Family Medicine

## 2023-01-13 DIAGNOSIS — F5089 Other specified eating disorder: Secondary | ICD-10-CM

## 2023-01-19 ENCOUNTER — Ambulatory Visit (INDEPENDENT_AMBULATORY_CARE_PROVIDER_SITE_OTHER): Payer: BC Managed Care – PPO | Admitting: Family Medicine

## 2023-01-19 ENCOUNTER — Encounter (INDEPENDENT_AMBULATORY_CARE_PROVIDER_SITE_OTHER): Payer: Self-pay | Admitting: Family Medicine

## 2023-01-19 VITALS — BP 135/86 | HR 84 | Temp 98.4°F | Ht 66.0 in | Wt 267.0 lb

## 2023-01-19 DIAGNOSIS — E669 Obesity, unspecified: Secondary | ICD-10-CM

## 2023-01-19 DIAGNOSIS — E559 Vitamin D deficiency, unspecified: Secondary | ICD-10-CM

## 2023-01-19 DIAGNOSIS — Z6841 Body Mass Index (BMI) 40.0 and over, adult: Secondary | ICD-10-CM

## 2023-01-19 DIAGNOSIS — F5089 Other specified eating disorder: Secondary | ICD-10-CM | POA: Diagnosis not present

## 2023-01-19 MED ORDER — TOPIRAMATE 50 MG PO TABS: 75.0000 mg | ORAL_TABLET | Freq: Every day | ORAL | 0 refills | Status: DC

## 2023-01-19 MED ORDER — VITAMIN D (ERGOCALCIFEROL) 1.25 MG (50000 UNIT) PO CAPS
50000.0000 [IU] | ORAL_CAPSULE | ORAL | 0 refills | Status: DC
Start: 2023-01-19 — End: 2023-03-09

## 2023-01-19 MED ORDER — LOMAIRA 8 MG PO TABS: 12.0000 mg | ORAL_TABLET | Freq: Every day | ORAL | 0 refills | Status: DC

## 2023-01-19 NOTE — Progress Notes (Unsigned)
Chief Complaint:   OBESITY [name] is here to discuss her progress with her obesity treatment plan along with follow-up of her obesity related diagnoses. Joan Dalton is on the [employer] and states she is following her eating plan approximately 60% of the time. [name] states she is at the gym for 60 minutes 2-3 times per week.  Today's visit was #: 12 Starting weight: 275 lbs Starting date: 11/05/2021 Today's weight: 261 lbs Today's date: 01/19/2023 Total lbs lost to date: 14 Total lbs lost since last in-office visit: 7  Interim History: Patient is trying to stay consistent and not get frustrated with the slowness of weight loss.  She is noticing a decrease in her pain associated with movement and has more energy.  She is still making her decision to not be over indulgent and try to stay more consistently on track. Next few weeks she doesn't have much until Thanksgiving. She is anticipating going to [place] for a few days over Thanksgiving holiday.  She wants to stay consistent with her physical activity.   Subjective:   1. Vitamin D deficiency Patient is on prescription vitamin D.  She denies nausea, vomiting, or muscle weakness but notes fatigue.  Her last vitamin D level at the end of August was at 24.9.  2. Other disorder of eating Patient is on Lomaira and topiramate.  Her starting makeshift Qsymia was at 278 pounds, and she is now at 261 pounds (so down 17 pounds)  Assessment/Plan:   1. Vitamin D deficiency We will refill prescription vitamin D 50,000 IU once every 7 days for 1 month.  - Vitamin D, Ergocalciferol, (DRISDOL) 1.25 MG (50000 UNIT) CAPS capsule; Take 1 capsule (50,000 Units total) by mouth every 7 (seven) days.  Dispense: 4 capsule; Refill: 0  2. Other disorder of eating We will refill Lomaira 12 mg and refill topiramate 75 mg for 1 month.  - Phentermine HCl (LOMAIRA) 8 MG TABS; Take 1.5 tablets (12 mg total) by mouth daily.  Dispense: 45 tablet;  Refill: 0 - topiramate (TOPAMAX) 50 MG tablet; Take 1.5 tablets (75 mg total) by mouth daily.  Dispense: 45 tablet; Refill: 0  3. BMI 40.0-44.9, adult (HCC)  4. Obesity with starting BMI of 44.5 Joan Dalton is currently in the action stage of change. As such, her goal is to continue with weight loss efforts. She has agreed to the [employer].   Exercise goals: As is.   Behavioral modification strategies: increasing lean protein intake, meal planning and cooking strategies, keeping healthy foods in the home, and planning for success.  Peri has agreed to follow-up with our clinic in 4 weeks. She was informed of the importance of frequent follow-up visits to maximize her success with intensive lifestyle modifications for her multiple health conditions.   Objective:   Blood pressure 135/86, pulse 84, temperature 98.4 F (36.9 C), height 5\' 6"  (1.676 m), weight 267 lb (121.1 kg), SpO2 99%. Body mass index is 43.09 kg/m.  General: Cooperative, alert, well developed, in no acute distress. HEENT: Conjunctivae and lids unremarkable. Cardiovascular: Regular rhythm.  Lungs: Normal work of breathing. Neurologic: No focal deficits.   Lab Results  Component Value Date   CREATININE 0.71 11/17/2022   BUN 9 11/17/2022   NA 141 11/17/2022   K 4.3 11/17/2022   CL 105 11/17/2022   CO2 22 11/17/2022   Lab Results  Component Value Date   ALT 20 11/17/2022   AST 16 11/17/2022   ALKPHOS 51 11/17/2022  BILITOT 0.3 11/17/2022   Lab Results  Component Value Date   HGBA1C 5.5 11/17/2022   HGBA1C 5.7 (H) 05/15/2022   HGBA1C 5.6 11/05/2021   Lab Results  Component Value Date   INSULIN 15.0 11/17/2022   INSULIN 9.7 05/15/2022   INSULIN 19.4 11/05/2021   Lab Results  Component Value Date   TSH 1.080 11/05/2021   Lab Results  Component Value Date   CHOL 212 (H) 11/17/2022   HDL 61 11/17/2022   LDLCALC 126 (H) 11/17/2022   TRIG 143 11/17/2022   Lab Results  Component Value  Date   VD25OH 24.9 (L) 11/17/2022   VD25OH 32.2 05/15/2022   VD25OH 17.9 (L) 11/05/2021   Lab Results  Component Value Date   WBC 5.0 11/05/2021   HGB 13.3 11/05/2021   HCT 40.0 11/05/2021   MCV 95 11/05/2021   PLT 266 11/05/2021   No results found for: "IRON", "TIBC", "FERRITIN"  Attestation Statements:   Reviewed by clinician on day of visit: allergies, medications, problem list, medical history, surgical history, family history, social history, and previous encounter notes.   I, Burt Knack, am acting as transcriptionist for Reuben Likes, MD.  I have reviewed the above documentation for accuracy and completeness, and I agree with the above. - Reuben Likes, MD

## 2023-02-03 ENCOUNTER — Inpatient Hospital Stay: Payer: BC Managed Care – PPO | Admitting: Hematology and Oncology

## 2023-02-16 ENCOUNTER — Inpatient Hospital Stay: Payer: BC Managed Care – PPO | Attending: Hematology and Oncology | Admitting: Hematology and Oncology

## 2023-02-16 ENCOUNTER — Encounter: Payer: Self-pay | Admitting: Hematology and Oncology

## 2023-02-16 ENCOUNTER — Telehealth (INDEPENDENT_AMBULATORY_CARE_PROVIDER_SITE_OTHER): Payer: Self-pay | Admitting: Family Medicine

## 2023-02-16 VITALS — BP 174/102 | HR 87 | Temp 98.7°F | Resp 18 | Wt 269.1 lb

## 2023-02-16 DIAGNOSIS — Z7901 Long term (current) use of anticoagulants: Secondary | ICD-10-CM | POA: Diagnosis not present

## 2023-02-16 DIAGNOSIS — Z79810 Long term (current) use of selective estrogen receptor modulators (SERMs): Secondary | ICD-10-CM | POA: Diagnosis not present

## 2023-02-16 DIAGNOSIS — D0511 Intraductal carcinoma in situ of right breast: Secondary | ICD-10-CM | POA: Diagnosis present

## 2023-02-16 DIAGNOSIS — Z923 Personal history of irradiation: Secondary | ICD-10-CM | POA: Diagnosis not present

## 2023-02-16 DIAGNOSIS — Z86718 Personal history of other venous thrombosis and embolism: Secondary | ICD-10-CM | POA: Insufficient documentation

## 2023-02-16 DIAGNOSIS — Z86711 Personal history of pulmonary embolism: Secondary | ICD-10-CM | POA: Insufficient documentation

## 2023-02-16 DIAGNOSIS — D0512 Intraductal carcinoma in situ of left breast: Secondary | ICD-10-CM | POA: Diagnosis not present

## 2023-02-16 NOTE — Telephone Encounter (Signed)
Pt called in stating that her Vitamin D prescription was not sent to the pharmacy on her 10/28 but her other refills were. Pt would like the refill sent in. Please call pt when completed. AIR

## 2023-02-16 NOTE — Progress Notes (Signed)
[facility] CONSULT NOTE  Patient Care Team: Ollen Bowl, MD as PCP - General (Internal Medicine) Rachel Moulds, MD as Consulting Physician (Hematology and Oncology) Griselda Miner, MD as Consulting Physician (General Surgery) Dorothy Puffer, MD as Consulting Physician (Radiation Oncology) Dillingham, Alena Bills, DO as Attending Physician (Plastic Surgery)  CHIEF COMPLAINTS/PURPOSE OF CONSULTATION:  Breast cancer  HISTORY OF PRESENTING ILLNESS:  Joan Dalton 47 y.o. female is here because of recent diagnosis of left breast cancer  I reviewed her records extensively and collaborated the history with the patient.  SUMMARY OF ONCOLOGIC HISTORY: Oncology History  Ductal carcinoma in situ (DCIS) of left breast  05/20/2021 Initial Diagnosis   Ductal carcinoma in situ (DCIS) of left breast   06/05/2021 Genetic Testing   Negative genetic testing on the CancerNext-Expanded+RNAinsight panel.  The report date is June 05, 2021.  The CancerNext-Expanded gene panel offered by [facility] and includes sequencing and rearrangement analysis for the following 77 genes: AIP, ALK, APC*, ATM*, AXIN2, BAP1, BARD1, BLM, BMPR1A, BRCA1*, BRCA2*, BRIP1*, CDC73, CDH1*, CDK4, CDKN1B, CDKN2A, CHEK2*, CTNNA1, DICER1, FANCC, FH, FLCN, GALNT12, KIF1B, LZTR1, MAX, MEN1, MET, MLH1*, MSH2*, MSH3, MSH6*, MUTYH*, NBN, NF1*, NF2, NTHL1, PALB2*, PHOX2B, PMS2*, POT1, PRKAR1A, PTCH1, PTEN*, RAD51C*, RAD51D*, RB1, RECQL, RET, SDHA, SDHAF2, SDHB, SDHC, SDHD, SMAD4, SMARCA4, SMARCB1, SMARCE1, STK11, SUFU, TMEM127, TP53*, TSC1, TSC2, VHL and XRCC2 (sequencing and deletion/duplication); EGFR, EGLN1, HOXB13, KIT, MITF, PDGFRA, POLD1, and POLE (sequencing only); EPCAM and GREM1 (deletion/duplication only). DNA and RNA analyses performed for * genes.    06/17/2021 Surgery   Patient had left breast lumpectomy which showed high-grade ductal carcinoma in situ with necrosis and calcifications, resection margins are  negative for carcinoma left additional superior margin excision again negative for carcinoma.  She had 3 out of 3 lymph nodes without involvement.  Prior prognostic showed ER 85% positive strong staining PR 95% positive strong staining. She is now undergoing plastic surgery, had right and left breast mammoplasty with no evidence of malignancy.   06/17/2021 Cancer Staging   Staging form: Breast, AJCC 8th Edition - Clinical stage from 06/17/2021: Stage 0 (cTis (DCIS), cN0, cM0, ER+, PR+) - Signed by Loa Socks, NP on 01/30/2022   08/28/2021 - 10/16/2021 Radiation Therapy   Site Technique Total Dose (Gy) Dose per Fx (Gy) Completed Fx Beam Energies  Breast, Left: Breast_L 3D 50.4/50.4 1.8 28/28 10XFFF  Breast, Left: Breast_L_Bst 3D 10/10 2 5/5 6X, 10X     10/2021 -  Anti-estrogen oral therapy   Tamoxifen    Patient is here for follow-up while on tamoxifen and Eliquis.  Discussed the use of AI scribe software for clinical note transcription with the patient, who gave verbal consent to proceed.  History of Present Illness         The patient, with a history of breast cancer and blood clots, presents with concerns about prediabetes, potential glaucoma, and weight management. She is currently on tamoxifen and Eliquis. The patient recently traveled to [place] and is currently a PhD Consulting civil engineer. She reports that she has been experiencing some cognitive issues, which she attributes to stress and fatigue. The patient has noticed a crusty nipple, which has been a consistent issue. The patient is also concerned about her blood pressure, which she reports has been high during recent visits. She attributes this to stress and lack of sleep due to her PhD studies.  Rest of the pertinent 10 point ROS reviewed and negative  MEDICAL HISTORY:  Past  Medical History:  Diagnosis Date   Anemia    Anxiety    Bilateral swelling of feet    Breast cancer (HCC) 05/08/2021   Constipation    Family history of  breast cancer    Family history of colon cancer    Family history of pancreatic cancer    Fatty liver    Hx of blood clots    Hypertension    Joint pain    Joint pain    Lactose intolerance    Palpitations    Personal history of radiation therapy    Vitamin D deficiency     SURGICAL HISTORY: Past Surgical History:  Procedure Laterality Date   ABLATION     D&C Ablation of Uterus   AXILLARY SENTINEL NODE BIOPSY Left 06/17/2021   Procedure: LEFT AXILLARY SENTINEL NODE BIOPSY;  Surgeon: Griselda Miner, MD;  Location: [facility] OR;  Service: General;  Laterality: Left;   BREAST BIOPSY Left 05/08/2021   BREAST BIOPSY Right 05/07/2022   [place] RT BREAST BX W LOC DEV 1ST LESION IMG BX SPEC US GUIDE 05/07/2022 [facility]   BREAST LUMPECTOMY Right 10/1998   BREAST LUMPECTOMY WITH RADIOACTIVE SEED AND SENTINEL LYMPH NODE BIOPSY Left 06/17/2021   Procedure: LEFT BREAST LUMPECTOMY WITH RADIOACTIVE SEEDS x 2;  Surgeon: Griselda Miner, MD;  Location: [facility] OR;  Service: General;  Laterality: Left;   BREAST REDUCTION SURGERY Bilateral 07/24/2021   Procedure: BILATERAL ONCOPLASTIC BREAST REDUCTION;  Surgeon: Peggye Form, DO;  Location: [facility] OR;  Service: Plastics;  Laterality: Bilateral;   MASTOPEXY Bilateral 07/24/2021   Procedure: MASTOPEXY;  Surgeon: Peggye Form, DO;  Location: [facility] OR;  Service: Plastics;  Laterality: Bilateral;    SOCIAL HISTORY: Social History   Socioeconomic History   Marital status: Married    Spouse name: KRYSLYN BITTING II   Number of children: 5   Years of education: Not on file   Highest education level: Not on file  Occupational History   Not on file  Tobacco Use   Smoking status: Never   Smokeless tobacco: Never  Vaping Use   Vaping status: Never Used  Substance and Sexual Activity   Alcohol use: Never   Drug use: Never   Sexual activity: Yes  Other Topics Concern   Not on file  Social History Narrative   Not on file   Social Determinants  of Health   Financial Resource Strain: Not on file  Food Insecurity: Not on file  Transportation Needs: Not on file  Physical Activity: Not on file  Stress: Not on file  Social Connections: Not on file  Intimate Partner Violence: Not At Risk (05/21/2021)   Humiliation, Afraid, Rape, and Kick questionnaire    Fear of Current or Ex-Partner: No    Emotionally Abused: No    Physically Abused: No    Sexually Abused: No    FAMILY HISTORY: Family History  Problem Relation Age of Onset   Breast cancer Mother        4s & 9s   Hypertension Father    Hyperlipidemia Father    Diabetes Father    Prostate cancer Father    Heart disease Father    Sleep apnea Father    Obesity Father    Colon cancer Maternal Grandfather    Breast cancer Maternal Aunt 46   Pancreatic cancer Maternal Aunt 37   Breast cancer Maternal Aunt        later 30s-early 42s   Colon  cancer Maternal Aunt 42   Colon cancer Maternal Uncle        60s   Pancreatic cancer Cousin        mother's maternal first cousin   Pancreatic cancer Cousin        mother's maternal first cousin    ALLERGIES:  is allergic to sulfamethoxazole-trimethoprim.  MEDICATIONS:  Current Outpatient Medications  Medication Sig Dispense Refill   apixaban (ELIQUIS) 5 MG TABS tablet Take 1 tablet (5 mg total) by mouth 2 (two) times daily. 60 tablet 4   Multiple Vitamin (MULTI-VITAMIN) tablet Take 1 tablet by mouth daily.     Phentermine HCl (LOMAIRA) 8 MG TABS Take 1.5 tablets (12 mg total) by mouth daily. 45 tablet 0   tamoxifen (NOLVADEX) 20 MG tablet Take 1 tablet (20 mg total) by mouth daily. (Patient not taking: Reported on 01/19/2023) 90 tablet 1   topiramate (TOPAMAX) 50 MG tablet Take 1.5 tablets (75 mg total) by mouth daily. 45 tablet 0   Vitamin D, Ergocalciferol, (DRISDOL) 1.25 MG (50000 UNIT) CAPS capsule Take 1 capsule (50,000 Units total) by mouth every 7 (seven) days. 4 capsule 0   No current facility-administered medications  for this visit.     PHYSICAL EXAMINATION: ECOG PERFORMANCE STATUS: 0 - Asymptomatic  Vitals:   02/16/23 0946  BP: (!) 174/102  Pulse: 87  Resp: 18  Temp: 98.7 F (37.1 C)  SpO2: 100%     Filed Weights   02/16/23 0946  Weight: 269 lb 1.6 oz (122.1 kg)     Physical Exam Constitutional:      Appearance: Normal appearance.  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 Crusting and dryness on both nipples. Improved radiation changes in left breast with softer skin and reduced peau d'orange appearance. No regional adenopathy Neurological:     Mental Status: She is alert.      LABORATORY DATA:  I have reviewed the data as listed Lab Results  Component Value Date   WBC 5.0 11/05/2021   HGB 13.3 11/05/2021   HCT 40.0 11/05/2021   MCV 95 11/05/2021   PLT 266 11/05/2021   Lab Results  Component Value Date   NA 141 11/17/2022   K 4.3 11/17/2022   CL 105 11/17/2022   CO2 22 11/17/2022    RADIOGRAPHIC STUDIES: I have personally reviewed the radiological reports and agreed with the findings in the report.  ASSESSMENT AND PLAN:   This is a very pleasant 47 year old female patient (menopausal status pre menopausal, last period Several years ago but had endometrial ablation) referred to hematology/oncology for DCIS of the right breast and recommendations regarding the above.  She has now completed adjuvant radiation and is here on tamoxifen. Her annual diagnostic mammogram is on Apr 16, 2023.  Breast Cancer On Tamoxifen with noted side effect of dryness and crusting of nipples. No new changes in breast tissue or skin. -Continue Tamoxifen and Eliquis due to risk of blood clots with Tamoxifen. -Scheduled mammogram on 04/16/2023.  History of Blood Clot Unprovoked blood clot in 2021. Currently on Eliquis due to Tamoxifen therapy. -Continue Eliquis.  Prediabetes Recent lab work suggestive of prediabetes. Patient considering Metformin. -Discuss Metformin  with primary care provider.  Glaucoma Recent diagnosis of glaucoma. Patient experiencing changes in vision. -Follow up with ophthalmologist at [facility] on 02/18/2023 to discuss treatment options.  Weight Management On Phentermine (Lomaira) and Topamax for weight loss. Recent weight loss of 16-17 pounds. -Continue current weight  loss regimen.  Hypertension Recent elevated blood pressure readings, possibly due to stress and lack of sleep. -Check blood pressure at home, particularly after waking up in the morning.  Colonoscopy Family history of colon cancer. Patient due for colonoscopy. -Schedule colonoscopy.  Follow-up in 6 months.  Total time spent 30 minutes including review of history, records, counseling about antiestrogen therapy options, coordination of care  Thank you for consulting [place] in the care of this patient.  Please not hesitate to contact us with any additional questions or concerns.

## 2023-02-18 ENCOUNTER — Ambulatory Visit (INDEPENDENT_AMBULATORY_CARE_PROVIDER_SITE_OTHER): Payer: BC Managed Care – PPO | Admitting: Family Medicine

## 2023-02-19 ENCOUNTER — Other Ambulatory Visit (INDEPENDENT_AMBULATORY_CARE_PROVIDER_SITE_OTHER): Payer: Self-pay | Admitting: Family Medicine

## 2023-02-19 DIAGNOSIS — F5089 Other specified eating disorder: Secondary | ICD-10-CM

## 2023-02-23 IMAGING — [facility] BREAST LOC DEV 1ST LESION INC MAMMO GUIDE*L*
8 of 12 series · 8 of 12 positions shown · non-contrast
Comparison: Previous exam(s).

CLINICAL DATA: Patient presents for bracketed radioactive seed
localization of the LEFT breast. Recent stereotactic guided core
biopsy of LEFT breast calcifications shows high-grade ductal
carcinoma in situ with possible focal micro invasion.

EXAM:
MAMMOGRAPHIC GUIDED RADIOACTIVE SEED LOCALIZATION OF THE LEFT BREAST

[L CC (1 of 6)]
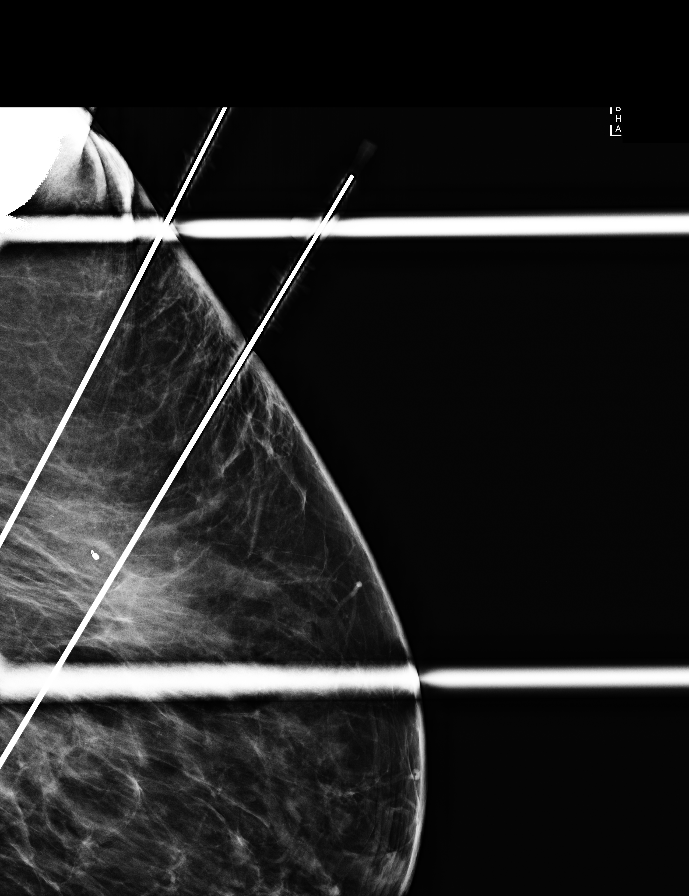

[L CC (2 of 6)]
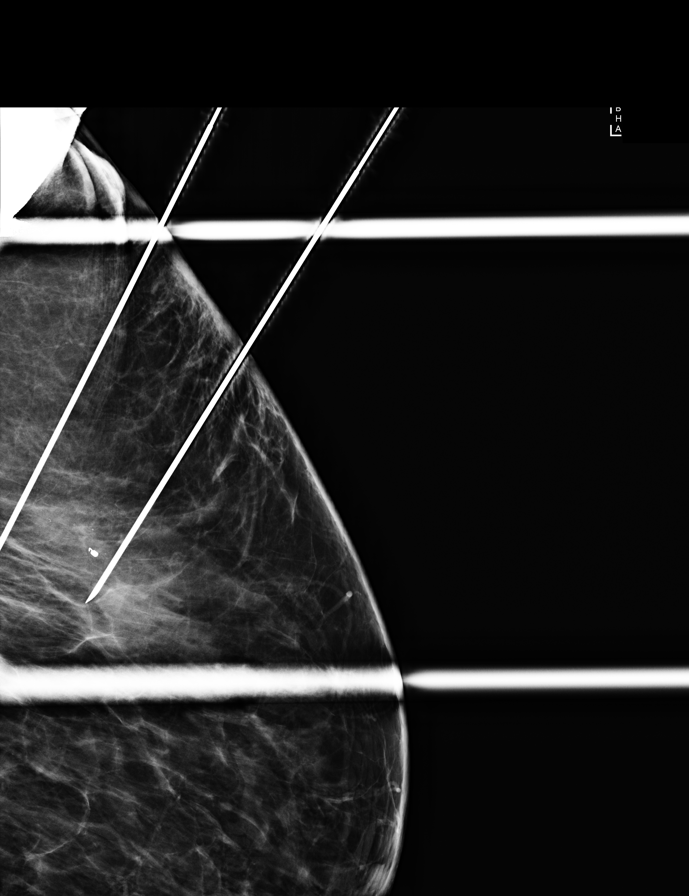

[L LM (1 of 2)]
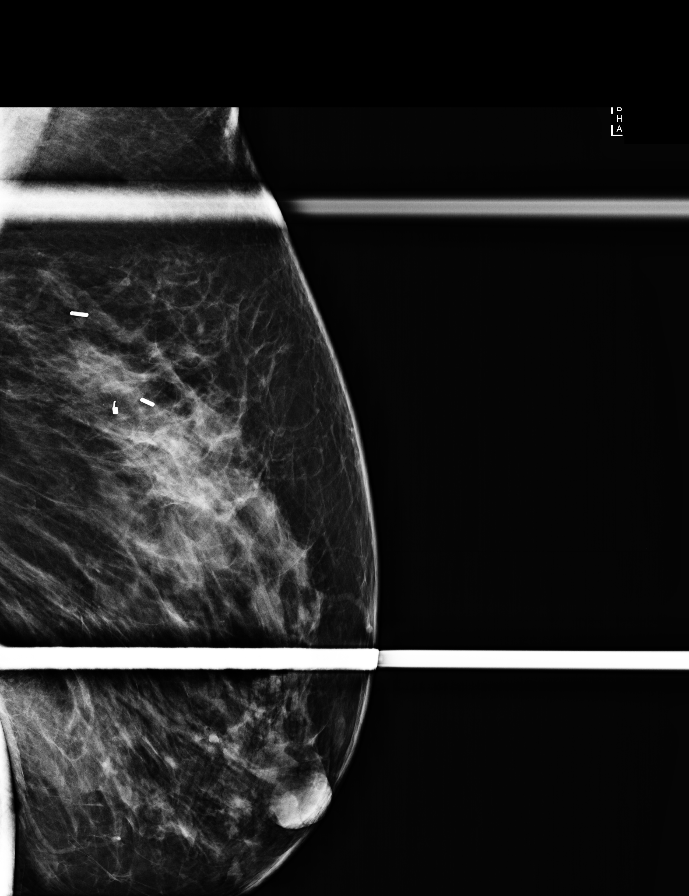

[L CC (3 of 6)]
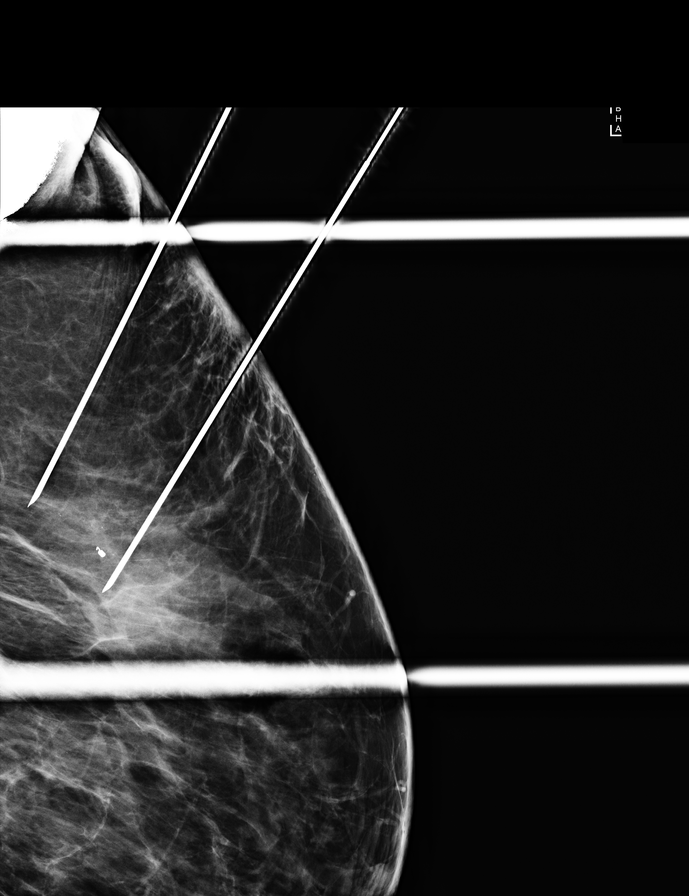

[L CC (4 of 6)]
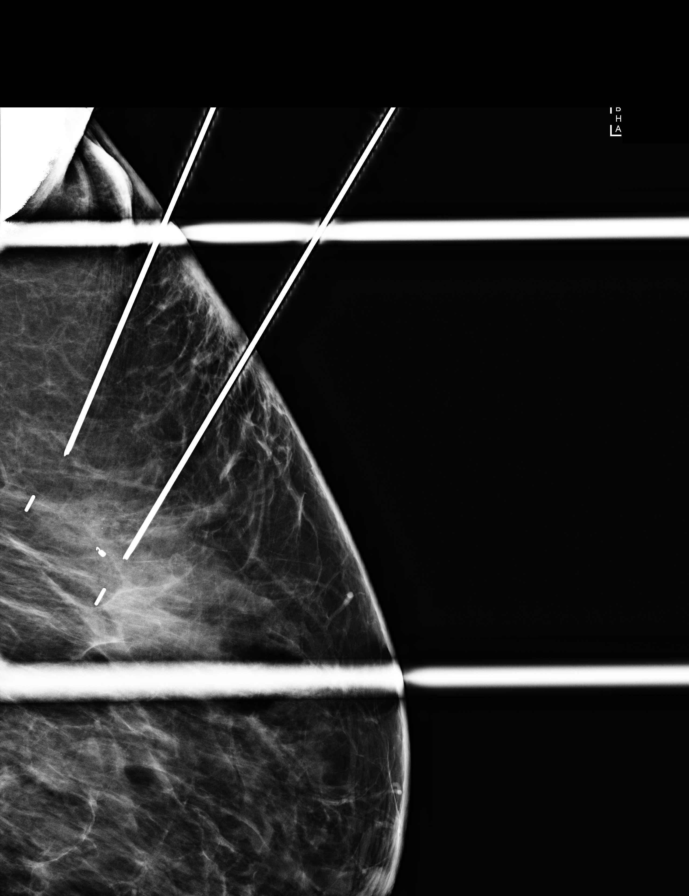

[L CC (5 of 6)]
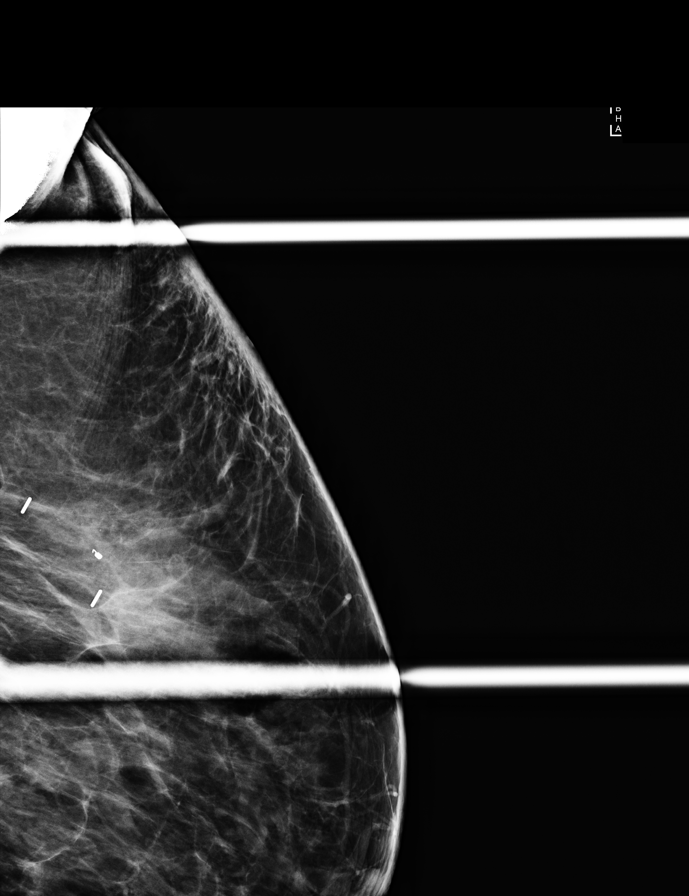

[L LM (2 of 2)]
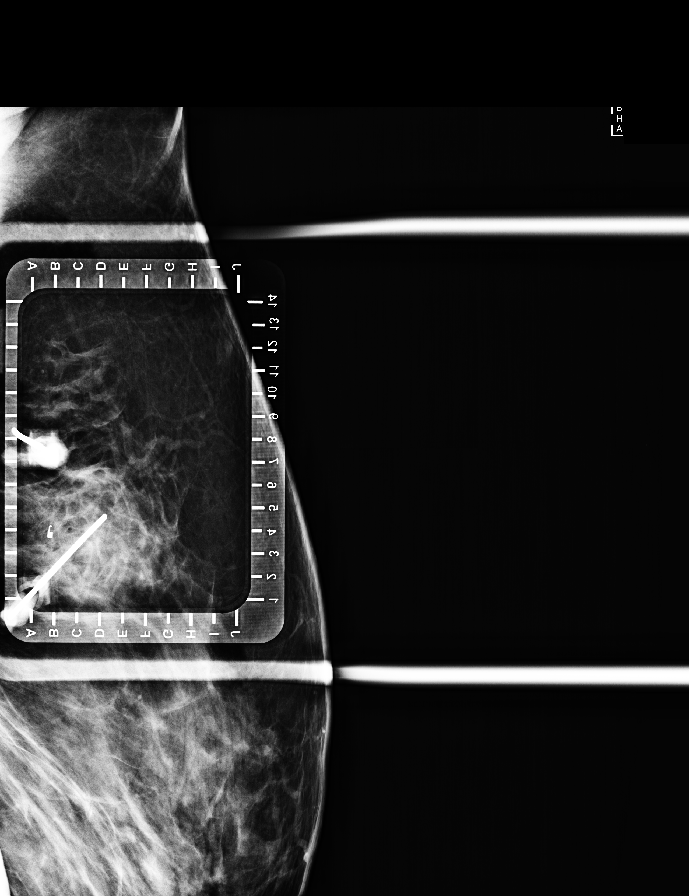

[L CC (6 of 6)]
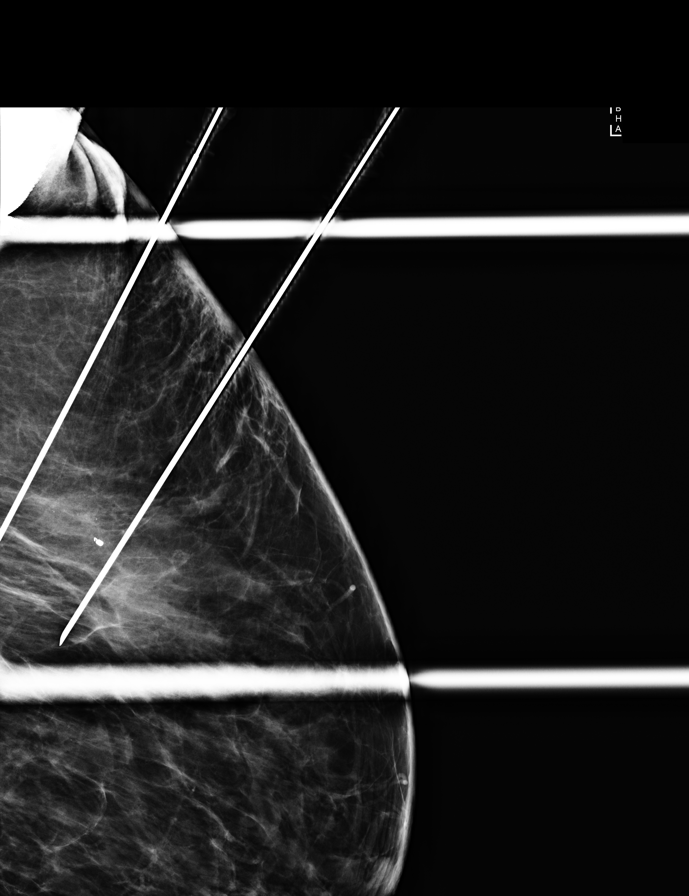

[8 of 12 positions shown; findings below may reference images not displayed]

FINDINGS: Patient presents for radioactive seed localization prior to
lumpectomy. I met with the patient and we discussed the procedure of
seed localization including benefits and alternatives. We discussed
the high likelihood of a successful procedure. We discussed the
risks of the procedure including infection, bleeding, tissue injury
and further surgery. We discussed the low dose of radioactivity
involved in the procedure. Informed, written consent was given.

The usual time-out protocol was performed immediately prior to the
procedure.

Cite5:

Using mammographic guidance, sterile technique, 1% lidocaine and an
I-87P radioactive seed, posterosuperior calcifications in the
UPPER-OUTER QUADRANT of the LEFT breast were localized using a
LATERAL approach. The follow-up mammogram images confirm the seed in
the expected location and were marked for Dr. Redman.

Follow-up survey of the patient confirms presence of the radioactive
seed.

Order number of I-87P seed:  [id number].

Total activity:  0.245 millicuries reference Date: 03/06/2021

Site 2:

Using mammographic guidance, sterile technique, 1% lidocaine and an
I-87P radioactive seed, the more anterior inferior calcifications,
anterior to the coil clip were localized using a LATERAL approach.
The follow-up mammogram images confirm the seed in the expected
location and were marked for Dr. Redman.

Follow-up survey of the patient confirms presence of the radioactive
seed.

Order number of I-87P seed:  [id number].

Total activity:  0.245 millicuries reference Date: 03/06/2021

The patient tolerated the procedure well and was released from the
[REDACTED]. She was given instructions regarding seed removal.
IMPRESSION: Bracketed radioactive seed localization of the LEFT breast. No
apparent complications.

## 2023-02-27 IMAGING — [facility] MM BREAST SURGICAL SPECIMEN
1 series · 1 of 1 positions shown · non-contrast
Comparison: Previous exam(s).

CLINICAL DATA: Patient status post left breast surgery.

EXAM:
SPECIMEN RADIOGRAPH OF THE LEFT BREAST

[L]
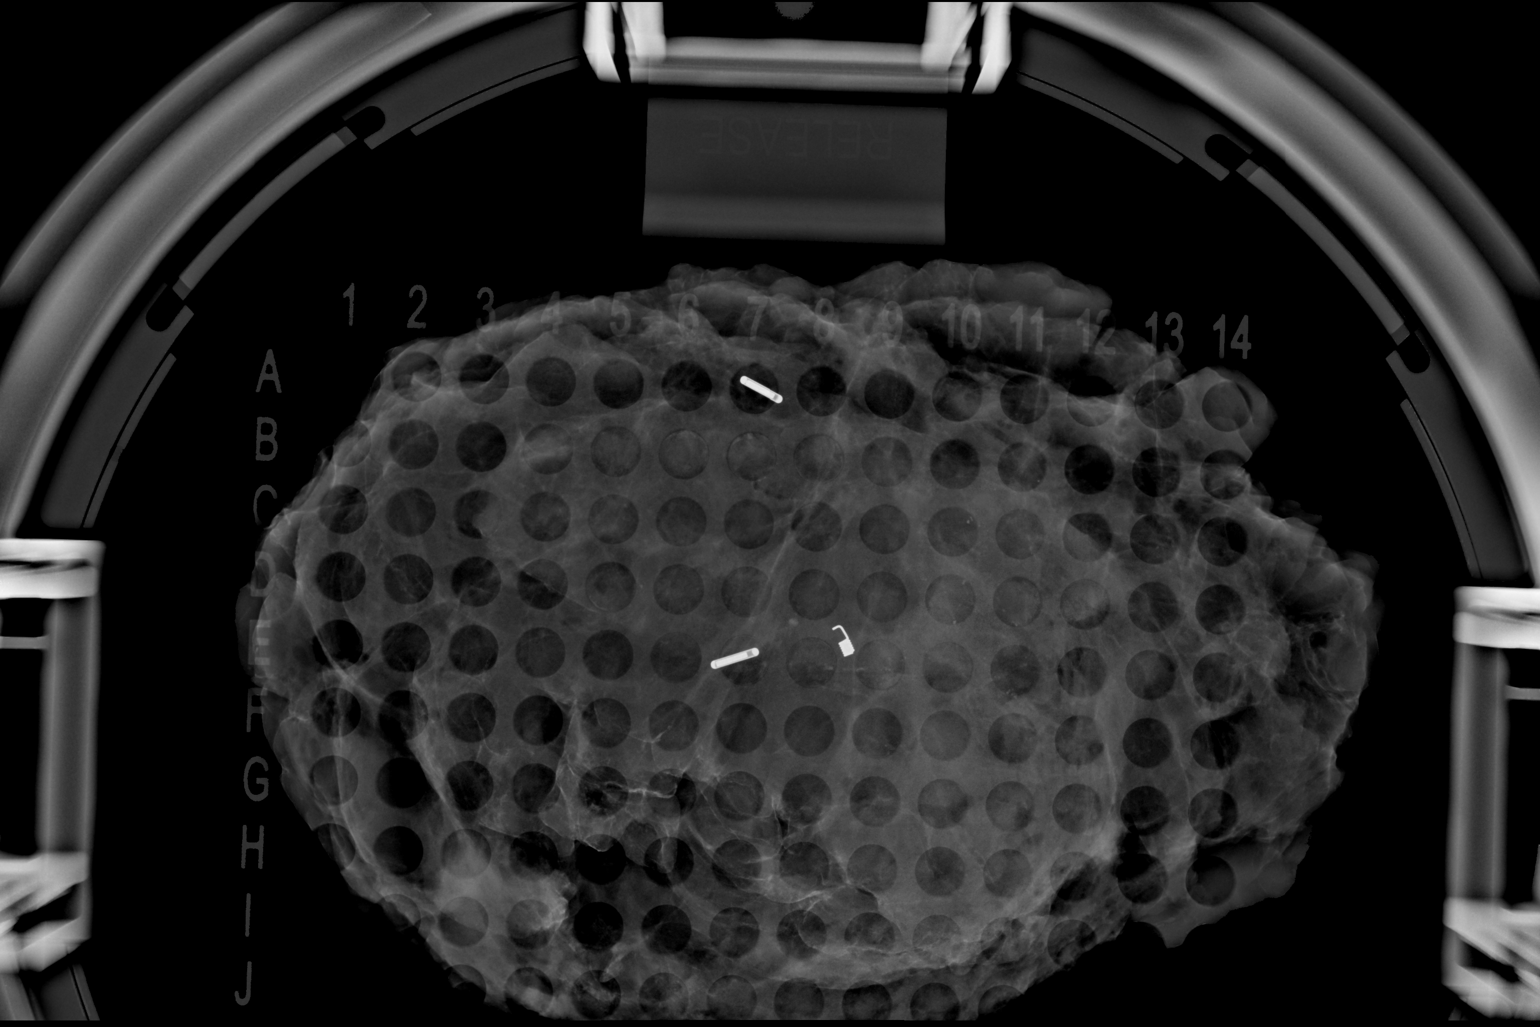

[1 of 1 positions shown; findings below may reference images not displayed]

FINDINGS: Status post excision of the left breast. The radioactive seeds and
biopsy marker clip (coil) are present, completely intact, and were
marked for pathology.
IMPRESSION: Specimen radiograph of the left breast.

## 2023-03-04 ENCOUNTER — Ambulatory Visit (INDEPENDENT_AMBULATORY_CARE_PROVIDER_SITE_OTHER): Payer: BC Managed Care – PPO | Admitting: Family Medicine

## 2023-03-09 ENCOUNTER — Ambulatory Visit (INDEPENDENT_AMBULATORY_CARE_PROVIDER_SITE_OTHER): Payer: BC Managed Care – PPO | Admitting: Family Medicine

## 2023-03-09 ENCOUNTER — Encounter (INDEPENDENT_AMBULATORY_CARE_PROVIDER_SITE_OTHER): Payer: Self-pay | Admitting: Family Medicine

## 2023-03-09 VITALS — BP 130/87 | HR 81 | Temp 98.4°F | Ht 66.0 in | Wt 263.0 lb

## 2023-03-09 DIAGNOSIS — E559 Vitamin D deficiency, unspecified: Secondary | ICD-10-CM | POA: Insufficient documentation

## 2023-03-09 DIAGNOSIS — Z6841 Body Mass Index (BMI) 40.0 and over, adult: Secondary | ICD-10-CM

## 2023-03-09 DIAGNOSIS — E669 Obesity, unspecified: Secondary | ICD-10-CM | POA: Diagnosis not present

## 2023-03-09 DIAGNOSIS — R7303 Prediabetes: Secondary | ICD-10-CM | POA: Insufficient documentation

## 2023-03-09 MED ORDER — METFORMIN HCL 500 MG PO TABS: 500.0000 mg | ORAL_TABLET | Freq: Every day | ORAL | 0 refills | Status: DC

## 2023-03-09 MED ORDER — VITAMIN D (ERGOCALCIFEROL) 1.25 MG (50000 UNIT) PO CAPS: 50000.0000 [IU] | ORAL_CAPSULE | ORAL | 0 refills | Status: DC

## 2023-03-09 NOTE — Assessment & Plan Note (Signed)
Last A1c better controlled.  Historical diagnosis with A1c at 5.7.  She is interested in metformin as an option to help with both her prediabetes and weight loss.  Metformin handout given today.  We discussed possible side effects and that patient can stop medication if she is experiencing any side effects.

## 2023-03-09 NOTE — Assessment & Plan Note (Signed)
Patient doing well on Rx Vitamin D.  Her last level in August of 24.  Needs refill today. Needs repeat labs done in February.  No nausea, vomiting or muscle weakness noted.

## 2023-03-09 NOTE — Progress Notes (Signed)
   SUBJECTIVE:  Chief Complaint: Obesity  Interim History: Patient has had a few medical things transpire since last appointment.  She was experiencing significant vision changes and was diagnosed with glaucoma that was worsened due to topiramate.  She had a procedure done last week on her eye.  She is wondering about whether or not metformin would be a medication option for her.  She ordered something called forks over knives to increase more food options her family has available. Planning to stay local for this month and start being more consistent with exercise at the gym.  Wants to do some of the classes at the gym more consistently.   Joan Dalton is here to discuss her progress with her obesity treatment plan. She is on the [employer] and states she is following her eating plan approximately 25 % of the time. She states she is not exercising.   OBJECTIVE: Visit Diagnoses: Problem List Items Addressed This Visit       Other   Prediabetes - Primary   Last A1c better controlled.  Historical diagnosis with A1c at 5.7.  She is interested in metformin as an option to help with both her prediabetes and weight loss.  Metformin handout given today.  We discussed possible side effects and that patient can stop medication if she is experiencing any side effects.       Relevant Medications   metFORMIN (GLUCOPHAGE) 500 MG tablet   Vitamin D deficiency   Patient doing well on Rx Vitamin D.  Her last level in August of 24.  Needs refill today. Needs repeat labs done in February.  No nausea, vomiting or muscle weakness noted.      Relevant Medications   Vitamin D, Ergocalciferol, (DRISDOL) 1.25 MG (50000 UNIT) CAPS capsule    Vitals Temp: 98.4 F (36.9 C) BP: 130/87 Pulse Rate: 81 SpO2: 97 %   Anthropometric Measurements Height: 5\' 6"  (1.676 m) Weight: 263 lb (119.3 kg) BMI (Calculated): 42.47 Weight at Last Visit: 267 lb Weight Lost Since Last Visit: 4 Weight Gained Since Last  Visit: 0 Starting Weight: 275 lb Total Weight Loss (lbs): 8 lb (3.629 kg)   Body Composition  Body Fat %: 51.5 % Fat Mass (lbs): 135.6 lbs Muscle Mass (lbs): 121.4 lbs Total Body Water (lbs): 98.6 lbs Visceral Fat Rating : 16   Other Clinical Data Today's Visit #: 13 Starting Date: 06/09/22     ASSESSMENT AND PLAN:  Diet: Joan Dalton is currently in the action stage of change. As such, her goal is to continue with weight loss efforts. She has agreed to [employer].  Exercise: Joan Dalton has been instructed  to hold off on exercise at this point due to recent health situations that arose.      Behavior Modification:  We discussed the following Behavioral Modification Strategies today: increasing lean protein intake, increasing vegetables, meal planning and cooking strategies, better snacking choices, and holiday eating strategies. We discussed various medication options to help Joan Dalton with her weight loss efforts and we both agreed to stop the phentermine and topiramate.  No follow-ups on file.Marland Kitchen She was informed of the importance of frequent follow up visits to maximize her success with intensive lifestyle modifications for her multiple health conditions.  Attestation Statements:   Reviewed by clinician on day of visit: allergies, medications, problem list, medical history, surgical history, family history, social history, and previous encounter notes.   Reuben Likes, MD

## 2023-03-31 ENCOUNTER — Other Ambulatory Visit (INDEPENDENT_AMBULATORY_CARE_PROVIDER_SITE_OTHER): Payer: Self-pay | Admitting: Family Medicine

## 2023-03-31 DIAGNOSIS — R7303 Prediabetes: Secondary | ICD-10-CM

## 2023-04-14 ENCOUNTER — Encounter (INDEPENDENT_AMBULATORY_CARE_PROVIDER_SITE_OTHER): Payer: Self-pay | Admitting: Family Medicine

## 2023-04-14 ENCOUNTER — Ambulatory Visit (INDEPENDENT_AMBULATORY_CARE_PROVIDER_SITE_OTHER): Payer: 59 | Admitting: Family Medicine

## 2023-04-14 VITALS — BP 136/89 | HR 97 | Temp 97.7°F | Ht 66.0 in | Wt 271.0 lb

## 2023-04-14 DIAGNOSIS — E669 Obesity, unspecified: Secondary | ICD-10-CM | POA: Diagnosis not present

## 2023-04-14 DIAGNOSIS — Z6841 Body Mass Index (BMI) 40.0 and over, adult: Secondary | ICD-10-CM | POA: Insufficient documentation

## 2023-04-14 DIAGNOSIS — R7303 Prediabetes: Secondary | ICD-10-CM | POA: Diagnosis not present

## 2023-04-14 DIAGNOSIS — H4010X0 Unspecified open-angle glaucoma, stage unspecified: Secondary | ICD-10-CM | POA: Diagnosis not present

## 2023-04-14 DIAGNOSIS — E559 Vitamin D deficiency, unspecified: Secondary | ICD-10-CM

## 2023-04-14 MED ORDER — VITAMIN D (ERGOCALCIFEROL) 1.25 MG (50000 UNIT) PO CAPS: 50000.0000 [IU] | ORAL_CAPSULE | ORAL | 0 refills | Status: DC

## 2023-04-14 MED ORDER — METFORMIN HCL 1000 MG PO TABS: 1000.0000 mg | ORAL_TABLET | Freq: Every day | ORAL | 0 refills | Status: DC

## 2023-04-14 NOTE — Progress Notes (Signed)
.[facility]  Office: [phone]  /  Fax: [phone]  WEIGHT SUMMARY AND BIOMETRICS  Anthropometric Measurements Height: 5\' 6"  (1.676 m) Weight: 271 lb (122.9 kg) BMI (Calculated): 43.76 Weight at Last Visit: 263 lb Weight Lost Since Last Visit: 0 Weight Gained Since Last Visit: 8 lb Starting Weight: 275 lb Total Weight Loss (lbs): 4 lb (1.814 kg)   Body Composition  Body Fat %: 54.1 % Fat Mass (lbs): 146.8 lbs Muscle Mass (lbs): 118.2 lbs Visceral Fat Rating : 17   Other Clinical Data Fasting: No Labs: No Today's Visit #: 14 Starting Date: 11/05/21    Chief Complaint: OBESITY   History of Present Illness   The patient, diagnosed with obesity and prediabetes, has been attempting to manage her conditions through diet, exercise, and weight loss. She was recently started on a low dose of metformin for prediabetes. Over the past five weeks, she has gained eight pounds, which coincided with the holiday season. She reports adherence to a pescetarian diet only about 20% of the time and has been increasing her cardio exercise at the gym to about 40 minutes, two times per week.  Previously, she was on phentermine and topiramate for weight loss, but these medications were discontinued due to a recent diagnosis of primary open-angle glaucoma. The patient did not report any significant benefits or side effects from the low dose of metformin. She has been taking the medication in the morning, sometimes without breakfast, and has not noticed any significant changes in her condition.  The patient is planning to participate in a church fast, which involves a diet primarily of fruits and vegetables.. She has been taking vitamin D, which is due for a refill.          PHYSICAL EXAM:  Blood pressure 136/89, pulse 97, temperature 97.7 F (36.5 C), height 5\' 6"  (1.676 m), weight 271 lb (122.9 kg), SpO2 97%. Body mass index is 43.74 kg/m.  DIAGNOSTIC DATA REVIEWED:  BMET    Component  Value Date/Time   NA 141 11/17/2022 0939   K 4.3 11/17/2022 0939   CL 105 11/17/2022 0939   CO2 22 11/17/2022 0939   GLUCOSE 94 11/17/2022 0939   GLUCOSE 99 06/12/2021 0836   BUN 9 11/17/2022 0939   CREATININE 0.71 11/17/2022 0939   CALCIUM 9.2 11/17/2022 0939   GFRNONAA >60 06/12/2021 0836   Lab Results  Component Value Date   HGBA1C 5.5 11/17/2022   HGBA1C 5.6 11/05/2021   Lab Results  Component Value Date   INSULIN 15.0 11/17/2022   INSULIN 19.4 11/05/2021   Lab Results  Component Value Date   TSH 1.080 11/05/2021   CBC    Component Value Date/Time   WBC 5.0 11/05/2021 0921   WBC 6.1 07/22/2021 0811   RBC 4.23 11/05/2021 0921   RBC 3.79 (L) 07/22/2021 0811   HGB 13.3 11/05/2021 0921   HCT 40.0 11/05/2021 0921   PLT 266 11/05/2021 0921   MCV 95 11/05/2021 0921   MCH 31.4 11/05/2021 0921   MCH 31.4 07/22/2021 0811   MCHC 33.3 11/05/2021 0921   MCHC 33.1 07/22/2021 0811   RDW 12.8 11/05/2021 0921   Iron Studies No results found for: "IRON", "TIBC", "FERRITIN", "IRONPCTSAT" Lipid Panel     Component Value Date/Time   CHOL 212 (H) 11/17/2022 0939   TRIG 143 11/17/2022 0939   HDL 61 11/17/2022 0939   LDLCALC 126 (H) 11/17/2022 0939   Hepatic Function Panel     Component Value Date/Time  PROT 6.7 11/17/2022 0939   ALBUMIN 4.1 11/17/2022 0939   AST 16 11/17/2022 0939   ALT 20 11/17/2022 0939   ALKPHOS 51 11/17/2022 0939   BILITOT 0.3 11/17/2022 0939      Component Value Date/Time   TSH 1.080 11/05/2021 0921   Nutritional Lab Results  Component Value Date   VD25OH 24.9 (L) 11/17/2022   VD25OH 32.2 05/15/2022   VD25OH 17.9 (L) 11/05/2021     Assessment and Plan    Obesity Gained eight pounds over the holiday season despite efforts in diet and exercise. Following a pescetarian diet 20% of the time and increasing cardio to 40 minutes twice weekly. Previous weight loss medications (phentermine and topiramate) are contraindicated due to primary  open-angle glaucoma. Discussed modifying church fast to include protein sources like eggs and fish to prevent metabolic slowdown, handout given for this today. - Encourage continuation of diet and exercise regimen - Recommend five to ten minutes of Peloton exercise most days of the week - Refill vitamin D prescription  Prediabetes Started on low-dose metformin (500 mg) without significant benefits or adverse effects. Explained dose sensitivity and recommended increasing to 1000 mg for better results. Advised taking with food to avoid stomach upset and the importance of regular eating to prevent hypoglycemia. - Increase metformin dose to 1000 mg daily - Advise taking metformin in the first half of the day - Recommend keeping small snacks like saltines on hand to manage potential hypoglycemia while on her church fast  Primary Open-Angle Glaucoma Diagnosed a month ago. Previous weight loss medications (phentermine and topiramate) are contraindicated. - Avoid phentermine and topiramate due to glaucoma diagnosis  Follow-up - Schedule follow-up appointments for February and March.          She was informed of the importance of frequent follow up visits to maximize her success with intensive lifestyle modifications for her multiple health conditions.    Quillian Quince, MD

## 2023-04-16 ENCOUNTER — Ambulatory Visit
Admission: RE | Admit: 2023-04-16 | Discharge: 2023-04-16 | Disposition: A | Discharge status: Home / Self Care | Payer: 59 | Source: Ambulatory Visit | Attending: Adult Health | Admitting: Adult Health

## 2023-04-16 ENCOUNTER — Other Ambulatory Visit: Payer: Self-pay | Admitting: Adult Health

## 2023-04-16 DIAGNOSIS — N6489 Other specified disorders of breast: Secondary | ICD-10-CM

## 2023-05-08 ENCOUNTER — Other Ambulatory Visit (INDEPENDENT_AMBULATORY_CARE_PROVIDER_SITE_OTHER): Payer: Self-pay | Admitting: Family Medicine

## 2023-05-08 DIAGNOSIS — R7303 Prediabetes: Secondary | ICD-10-CM

## 2023-05-11 ENCOUNTER — Ambulatory Visit (INDEPENDENT_AMBULATORY_CARE_PROVIDER_SITE_OTHER): Payer: 59 | Admitting: Family Medicine

## 2023-05-11 ENCOUNTER — Ambulatory Visit (INDEPENDENT_AMBULATORY_CARE_PROVIDER_SITE_OTHER): Payer: 59 | Admitting: Physician Assistant

## 2023-05-11 ENCOUNTER — Encounter (INDEPENDENT_AMBULATORY_CARE_PROVIDER_SITE_OTHER): Payer: Self-pay | Admitting: Physician Assistant

## 2023-05-11 VITALS — BP 119/77 | HR 68 | Temp 99.3°F | Ht 66.0 in | Wt 256.0 lb

## 2023-05-11 DIAGNOSIS — F5089 Other specified eating disorder: Secondary | ICD-10-CM

## 2023-05-11 DIAGNOSIS — E559 Vitamin D deficiency, unspecified: Secondary | ICD-10-CM

## 2023-05-11 DIAGNOSIS — E669 Obesity, unspecified: Secondary | ICD-10-CM

## 2023-05-11 DIAGNOSIS — H409 Unspecified glaucoma: Secondary | ICD-10-CM | POA: Diagnosis not present

## 2023-05-11 DIAGNOSIS — R7303 Prediabetes: Secondary | ICD-10-CM | POA: Diagnosis not present

## 2023-05-11 DIAGNOSIS — D0512 Intraductal carcinoma in situ of left breast: Secondary | ICD-10-CM

## 2023-05-11 DIAGNOSIS — H4010X0 Unspecified open-angle glaucoma, stage unspecified: Secondary | ICD-10-CM

## 2023-05-11 DIAGNOSIS — Z6841 Body Mass Index (BMI) 40.0 and over, adult: Secondary | ICD-10-CM

## 2023-05-11 MED ORDER — VITAMIN D (ERGOCALCIFEROL) 1.25 MG (50000 UNIT) PO CAPS: 50000.0000 [IU] | ORAL_CAPSULE | ORAL | 0 refills | Status: DC

## 2023-05-11 NOTE — Progress Notes (Unsigned)
SUBJECTIVE:  Chief Complaint: Obesity  Interim History: She is down 13 lbs since last visit.  Down 19 lbs overall TBW loss of 6.9%  Joan Dalton is here to discuss her progress with her obesity treatment plan. She is on the [employer] and states she is following her eating plan approximately 75 % of the time. She states she is exercising Peloton/PT/Gym 30/30/45 minutes 2-3/1/2 times per week.  Joan Dalton is a 48 year old female who presents for follow-up of her obesity treatment plan.  She has been on metformin since December 2024, with the dose increased to 1000 mg daily at breakfast. She incorporates exercise into her routine and participates in a church fast, which she finds helps with focus and accountability. She consumes more protein shakes, specifically Fairlife, to help manage hunger. She has successfully reduced her adipose tissue from 146.8 to 132.4 pounds.  She has a recent diagnosis of glaucoma, which led to the discontinuation of phentermine and topiramate. She is currently on ergocalciferol 50,000 units once weekly and Eliquis 5 mg twice daily. She notes improved energy levels and immune function with vitamin D supplementation.  She has a history of breast cancer and is on tamoxifen to manage her estrogen levels. She experienced a blood clot in 2021 after receiving her first COVID vaccine, which led to hospitalization and a year-long course of Eliquis. She was taken off Eliquis after no recurrence but was put back on it in 2023 after another clot was detected. She has no known history of hypercoagulability or genetic variants related to clotting.  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 She is working on making exercise a positive habit and is aware of the challenges posed by food temptations and stress.    Pharmacotherapy: Phentermine/topiramate stopped due to dx  of glaucoma.    Metformin started 03/09/23 and increased 04/14/23  OBJECTIVE: Visit Diagnoses: Problem List Items Addressed This Visit     Prediabetes - Primary   Vitamin D deficiency   Relevant Medications   Vitamin D, Ergocalciferol, (DRISDOL) 1.25 MG (50000 UNIT) CAPS capsule   Obesity, Beginning BMI 44.39   Other Visit Diagnoses       Other disorder of eating         Obesity Joan Dalton, 47, has been on metformin 1000 mg daily since December 2024. She has reduced her adipose mass from 146.8 lbs to 132.4 lbs through exercise and intermittent  fasting. Reports good energy levels and regular exercise. Discussed the importance of maintaining her regimen and the benefits of continued weight loss. - Continue metformin 1000 mg daily - Encourage continued exercise and fasting - Refill ergocalciferol 50,000 units weekly  Prediabetes Last A1c was 5.5  Medication(s):  Metformin 1000 mg daily  No GI or other side effects.  Polyphagia:No Lab Results  Component Value Date   HGBA1C 5.5 11/17/2022   HGBA1C 5.7 (H) 05/15/2022   HGBA1C 5.6 11/05/2021   Lab Results  Component Value Date   INSULIN 15.0 11/17/2022   INSULIN 9.7 05/15/2022   INSULIN 19.4 11/05/2021    Plan: Continue  metformin 1000 mg daily  Continue working on nutrition plan to decrease simple carbohydrates, increase lean proteins and exercise to promote weight loss, improve glycemic control and prevent progression to Type 2 diabetes.    Vitamin D Deficiency Vitamin D is not at goal of 50.  Most recent vitamin D  level was 24.9. She is on  prescription ergocalciferol 50,000 IU weekly. No N/V or muscle weakness with Ergo.  Lab Results  Component Value Date   VD25OH 24.9 (L) 11/17/2022   VD25OH 32.2 05/15/2022   VD25OH 17.9 (L) 11/05/2021    Plan: Continue and refill  prescription ergocalciferol 50,000 IU weekly Low vitamin D levels can be associated with adiposity and may result in leptin resistance and  weight gain. Also associated with fatigue.  Currently on vitamin D supplementation without any adverse effects such as nausea, vomiting or muscle weakness.   Glaucoma Recently diagnosed with glaucoma, leading to the discontinuation of phentermine and topiramate. Discussed the importance of monitoring vision changes or symptoms. 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 Also taking cod liver oil and black seed oil for symptom management. Discussed the importance of continuing tamoxifen and monitoring for new symptoms or side effects. - Continue tamoxifen as prescribed - Monitor for new symptoms or side effects  History of Pulmonary Embolism Pulmonary embolism possibly following post-COVID vaccination in 2021, currently on Eliquis 5 mg twice daily. No known hypercoagulability or genetic variants. Discussed the importance of continuing Eliquis and monitoring for clot recurrence. - Continue Eliquis 5 mg twice daily - Monitor for signs of clot recurrence  General Health Maintenance Taking multivitamins and cod liver oil to support immune function and overall health. Reports feeling less sick and more energetic. Discussed the benefits of these supplements. - Continue multivitamins and cod liver oil - Encourage regular exercise and healthy diet  Follow-up - Follow-up appointment with Dr. Lawson Radar on March 17 at 7:40 AM.  Vitals Temp: 99.3 F (37.4 C) BP: 119/77 Pulse Rate: 68 SpO2: 96 %   Anthropometric Measurements Height: 5\' 6"  (1.676 m) Weight: 256 lb (116.1 kg) BMI (Calculated): 41.34 Weight at Last Visit: 271 lb Weight Lost Since Last Visit: 15 lb Weight Gained Since Last Visit: 0 Starting Weight: 275 lb Total Weight Loss (lbs): 19 lb (8.618 kg)   Body Composition  Body Fat %: 51.6 % Fat Mass (lbs): 132.4 lbs Muscle Mass (lbs): 118 lbs Total Body Water (lbs): 972  lbs Visceral Fat Rating : 16   Other Clinical Data Fasting: no Labs: no Today's Visit #: 15 Starting Date: 11/05/21     ASSESSMENT AND PLAN:  Diet: Joan Dalton is currently in the action stage of change. As such, her goal is to continue with weight loss efforts. She has agreed to [employer].  Exercise: Joan Dalton has been instructed to continue exercising as is for weight loss and overall health benefits.   Behavior Modification:  We discussed the following Behavioral Modification Strategies today: increasing lean protein intake, decreasing simple carbohydrates, increasing vegetables, increase H2O intake, increase high fiber foods, avoiding temptations, and planning for success. We discussed various medication options to help Joan Dalton with her weight loss efforts and we both agreed to continue metformin for .  No follow-ups on file.Marland Kitchen She was informed of the importance of frequent follow up visits to maximize her success with intensive lifestyle modifications for her multiple health conditions.  Attestation Statements:   Reviewed by clinician on day of visit: allergies, medications, problem list, medical history, surgical history, family history, social history, and previous encounter notes.   Time spent on visit including pre-visit chart review and post-visit care and charting was 33 minutes.    Joan Comins, PA-C

## 2023-06-01 ENCOUNTER — Other Ambulatory Visit (INDEPENDENT_AMBULATORY_CARE_PROVIDER_SITE_OTHER): Payer: Self-pay | Admitting: Physician Assistant

## 2023-06-01 DIAGNOSIS — E559 Vitamin D deficiency, unspecified: Secondary | ICD-10-CM

## 2023-06-08 ENCOUNTER — Ambulatory Visit (INDEPENDENT_AMBULATORY_CARE_PROVIDER_SITE_OTHER): Payer: 59 | Admitting: Family Medicine

## 2023-06-08 ENCOUNTER — Encounter (INDEPENDENT_AMBULATORY_CARE_PROVIDER_SITE_OTHER): Payer: Self-pay | Admitting: Family Medicine

## 2023-06-08 VITALS — BP 144/86 | HR 88 | Temp 97.8°F | Ht 66.0 in | Wt 255.0 lb

## 2023-06-08 DIAGNOSIS — E785 Hyperlipidemia, unspecified: Secondary | ICD-10-CM

## 2023-06-08 DIAGNOSIS — R7303 Prediabetes: Secondary | ICD-10-CM | POA: Diagnosis not present

## 2023-06-08 DIAGNOSIS — E559 Vitamin D deficiency, unspecified: Secondary | ICD-10-CM | POA: Diagnosis not present

## 2023-06-08 DIAGNOSIS — E7849 Other hyperlipidemia: Secondary | ICD-10-CM

## 2023-06-08 DIAGNOSIS — E669 Obesity, unspecified: Secondary | ICD-10-CM | POA: Diagnosis not present

## 2023-06-08 DIAGNOSIS — Z6841 Body Mass Index (BMI) 40.0 and over, adult: Secondary | ICD-10-CM

## 2023-06-08 NOTE — Assessment & Plan Note (Signed)
 Last LDL slightly elevated in August 2024.  Needs a repeat FLP today.  Not on medication currently.  Will plan to discuss labs at next appointment.

## 2023-06-08 NOTE — Progress Notes (Signed)
 SUBJECTIVE:  Chief Complaint: Obesity  Interim History: Patient has been trying to get in more exercise and be more consistent with that.  She did do a fast at the beginning of the year.  She is trying to stay healthier but it is costly.  She is figuring out what will work with her family nutritionally.  She liked doing more shakes to get higher protein in and satisfy more nutrient needs.  Consistency has been difficult due to the size of her house and how quickly the food goes.  Wants to try be more mindful of choices.   Joan Dalton is here to discuss her progress with her obesity treatment plan. She is on the [employer] and states she is following her eating plan approximately 50 % of the time. She states she is exercising 30 minutes 2 times per week.   OBJECTIVE: Visit Diagnoses: Problem List Items Addressed This Visit       Other   Prediabetes - Primary   Doing well on metformin with no GI side effects.  Needs a refill of metformin today.  Last A1c better controlled but insulin still elevated.  Will repeat labs today.        Relevant Orders   Comprehensive metabolic panel   Hemoglobin A1c   Insulin, random   Vitamin D deficiency   On prescription strength Vitamin D today.  No nausea, vomiting or muscle weakness.  Vitamin D level ordered today.      Relevant Orders   VITAMIN D 25 Hydroxy (Vit-D Deficiency, Fractures)   Hyperlipidemia   Last LDL slightly elevated in August 2024.  Needs a repeat FLP today.  Not on medication currently.  Will plan to discuss labs at next appointment.      Relevant Orders   Lipid Panel With LDL/HDL Ratio    Vitals Temp: 32.4 F (36.6 C) BP: (!) 136/92 Pulse Rate: 88 SpO2: 97 %   Anthropometric Measurements Height: 5\' 6"  (1.676 m) Weight: 255 lb (115.7 kg) BMI (Calculated): 41.18 Weight at Last Visit: 256 lb Weight Lost Since Last Visit: 1 Weight Gained Since Last Visit: 0 Starting Weight: 275 lb Total Weight Loss (lbs): 20  lb (9.072 kg)   Body Composition  Body Fat %: 50.8 % Fat Mass (lbs): 130 lbs Muscle Mass (lbs): 119.4 lbs Total Body Water (lbs): 97.8 lbs Visceral Fat Rating : 15   Other Clinical Data Fasting: yes Labs: yes Today's Visit #: 16 Starting Date: 11/05/21 Comments: Pesc     ASSESSMENT AND PLAN:  Diet: Joan Dalton is currently in the action stage of change. As such, her goal is to continue with weight loss efforts and has agreed to the [employer].   Exercise:  For substantial health benefits, adults should do at least 150 minutes (2 hours and 30 minutes) a week of moderate-intensity, or 75 minutes (1 hour and 15 minutes) a week of vigorous-intensity aerobic physical activity, or an equivalent combination of moderate- and vigorous-intensity aerobic activity. Aerobic activity should be performed in episodes of at least 10 minutes, and preferably, it should be spread throughout the week.  Behavior Modification:  We discussed the following Behavioral Modification Strategies today: increasing lean protein intake, decreasing simple carbohydrates, meal planning and cooking strategies, better snacking choices, avoiding temptations, and planning for success.   Return in about 4 weeks (around 07/06/2023).Marland Kitchen She was informed of the importance of frequent follow up visits to maximize her success with intensive lifestyle modifications for her multiple health conditions.  Attestation Statements:   Reviewed by clinician on day of visit: allergies, medications, problem list, medical history, surgical history, family history, social history, and previous encounter notes.   Time spent on visit including pre-visit chart review and post-visit care and charting was 25 minutes  Reuben Likes, MD

## 2023-06-08 NOTE — Assessment & Plan Note (Signed)
 Doing well on metformin with no GI side effects.  Needs a refill of metformin today.  Last A1c better controlled but insulin still elevated.  Will repeat labs today.

## 2023-06-08 NOTE — Assessment & Plan Note (Signed)
 On prescription strength Vitamin D today.  No nausea, vomiting or muscle weakness.  Vitamin D level ordered today.

## 2023-06-09 LAB — COMPREHENSIVE METABOLIC PANEL
ALT: 25 IU/L (ref 0–32)
AST: 13 IU/L (ref 0–40)
Albumin: 4.2 g/dL (ref 3.9–4.9)
Alkaline Phosphatase: 52 IU/L (ref 44–121)
BUN/Creatinine Ratio: 13 (ref 9–23)
BUN: 9 mg/dL (ref 6–24)
Bilirubin Total: 0.3 mg/dL (ref 0.0–1.2)
CO2: 23 mmol/L (ref 20–29)
Calcium: 9.7 mg/dL (ref 8.7–10.2)
Chloride: 104 mmol/L (ref 96–106)
Creatinine, Ser: 0.72 mg/dL (ref 0.57–1.00)
Globulin, Total: 2.2 g/dL (ref 1.5–4.5)
Glucose: 90 mg/dL (ref 70–99)
Potassium: 4.3 mmol/L (ref 3.5–5.2)
Sodium: 142 mmol/L (ref 134–144)
Total Protein: 6.4 g/dL (ref 6.0–8.5)
eGFR: 102 mL/min/{1.73_m2} (ref >59)

## 2023-06-09 LAB — HEMOGLOBIN A1C
Est. average glucose Bld gHb Est-mCnc: 114 mg/dL
Hgb A1c MFr Bld: 5.6 % (ref 4.8–5.6)

## 2023-06-09 LAB — LIPID PANEL WITH LDL/HDL RATIO
Cholesterol, Total: 207 mg/dL — ABNORMAL HIGH (ref 100–199)
HDL: 64 mg/dL (ref >39)
LDL Chol Calc (NIH): 123 mg/dL — ABNORMAL HIGH (ref 0–99)
LDL/HDL Ratio: 1.9 ratio (ref 0.0–3.2)
Triglycerides: 115 mg/dL (ref 0–149)
VLDL Cholesterol Cal: 20 mg/dL (ref 5–40)

## 2023-06-09 LAB — VITAMIN D 25 HYDROXY (VIT D DEFICIENCY, FRACTURES): Vit D, 25-Hydroxy: 34.5 ng/mL (ref 30.0–100.0)

## 2023-06-09 LAB — INSULIN, RANDOM: INSULIN: 11.3 u[IU]/mL (ref 2.6–24.9)

## 2023-07-07 ENCOUNTER — Other Ambulatory Visit: Payer: Self-pay | Admitting: Hematology and Oncology

## 2023-07-14 ENCOUNTER — Ambulatory Visit (INDEPENDENT_AMBULATORY_CARE_PROVIDER_SITE_OTHER): Admitting: Family Medicine

## 2023-07-14 ENCOUNTER — Encounter (INDEPENDENT_AMBULATORY_CARE_PROVIDER_SITE_OTHER): Payer: Self-pay | Admitting: Family Medicine

## 2023-07-14 VITALS — BP 124/77 | HR 79 | Temp 98.0°F | Ht 66.0 in | Wt 257.0 lb

## 2023-07-14 DIAGNOSIS — E785 Hyperlipidemia, unspecified: Secondary | ICD-10-CM

## 2023-07-14 DIAGNOSIS — Z6841 Body Mass Index (BMI) 40.0 and over, adult: Secondary | ICD-10-CM

## 2023-07-14 DIAGNOSIS — E7849 Other hyperlipidemia: Secondary | ICD-10-CM

## 2023-07-14 DIAGNOSIS — E559 Vitamin D deficiency, unspecified: Secondary | ICD-10-CM

## 2023-07-14 DIAGNOSIS — R7303 Prediabetes: Secondary | ICD-10-CM | POA: Diagnosis not present

## 2023-07-14 DIAGNOSIS — E669 Obesity, unspecified: Secondary | ICD-10-CM

## 2023-07-14 MED ORDER — VITAMIN D (ERGOCALCIFEROL) 1.25 MG (50000 UNIT) PO CAPS: 50000.0000 [IU] | ORAL_CAPSULE | ORAL | 0 refills | Status: DC

## 2023-07-14 NOTE — Assessment & Plan Note (Signed)
 A1c labs from prior labs at last appointment stable and controlled.  Continue pescatarian plan and be more consistent with food option intake and choices.

## 2023-07-14 NOTE — Assessment & Plan Note (Signed)
 Vitamin D  level higher than previously but still not at goal.  Will prescribe Vitamin D  for the next 3 months and then switch to a daily OTC.

## 2023-07-14 NOTE — Progress Notes (Signed)
 SUBJECTIVE:  Chief Complaint: Obesity  Interim History: Last few weeks patient has been a bit different than previously.  She has done a fast.  She has gotten back on the Pescatarian.  She did eat traditional holiday foods for Easter.  Exercise wise she hasn't been as consistent due to being at the end of the semester.  The semester ends April 30th.  Summer seems more manageable for her time wise and demand wise.   Joan Dalton is here to discuss her progress with her obesity treatment plan. She is on the [employer] and states she is following her eating plan approximately 65 % of the time. She states she is not exercising consistently.   OBJECTIVE: Visit Diagnoses: Problem List Items Addressed This Visit       Other   Prediabetes   A1c labs from prior labs at last appointment stable and controlled.  Continue pescatarian plan and be more consistent with food option intake and choices.      Vitamin D  deficiency - Primary   Vitamin D  level higher than previously but still not at goal.  Will prescribe Vitamin D  for the next 3 months and then switch to a daily OTC.      Relevant Medications   Vitamin D , Ergocalciferol , (DRISDOL ) 1.25 MG (50000 UNIT) CAPS capsule   BMI 40.0-44.9, adult (HCC)   Obesity, Beginning BMI 44.39   Hyperlipidemia   The 10-year ASCVD risk score (Arnett DK, et al., 2019) is: 2.3%   Values used to calculate the score:     Age: 48 years     Sex: Female     Is Non-Hispanic African American: Yes     Diabetic: No     Tobacco smoker: No     Systolic Blood Pressure: 124 mmHg     Is BP treated: Yes     HDL Cholesterol: 64 mg/dL     Total Cholesterol: 207 mg/dL Discussed labs from last appointment today.  LDL still slightly elevated, HDL has gone up a bit.  Not on medication.  No change in treatment today- continue lifestyle changes.       Vitals Temp: 98 F (36.7 C) BP: 124/77 Pulse Rate: 79 SpO2: 97 %   Anthropometric Measurements Height: 5\' 6"   (1.676 m) Weight: 257 lb (116.6 kg) BMI (Calculated): 41.5 Weight at Last Visit: 255 lb Weight Lost Since Last Visit: 0 Weight Gained Since Last Visit: 2 Starting Weight: 275 lb Total Weight Loss (lbs): 18 lb (8.165 kg)   Body Composition  Body Fat %: 51.6 % Fat Mass (lbs): 132.8 lbs Muscle Mass (lbs): 118.2 lbs Visceral Fat Rating : 16   Other Clinical Data Today's Visit #: 17 Starting Date: 11/05/21 Comments: Pesc     ASSESSMENT AND PLAN:  Diet: Joan Dalton is currently in the action stage of change. As such, her goal is to continue with weight loss efforts and has agreed to the [employer].   Exercise:  For substantial health benefits, adults should do at least 150 minutes (2 hours and 30 minutes) a week of moderate-intensity, or 75 minutes (1 hour and 15 minutes) a week of vigorous-intensity aerobic physical activity, or an equivalent combination of moderate- and vigorous-intensity aerobic activity. Aerobic activity should be performed in episodes of at least 10 minutes, and preferably, it should be spread throughout the week.  Behavior Modification:  We discussed the following Behavioral Modification Strategies today: increasing lean protein intake, decreasing simple carbohydrates, meal planning and cooking strategies, avoiding temptations,  and planning for success.   Return in about 4 weeks (around 08/11/2023).Joan Dalton She was informed of the importance of frequent follow up visits to maximize her success with intensive lifestyle modifications for her multiple health conditions.  Attestation Statements:   Reviewed by clinician on day of visit: allergies, medications, problem list, medical history, surgical history, family history, social history, and previous encounter notes.     Joan Frizzle, MD

## 2023-07-14 NOTE — Assessment & Plan Note (Signed)
 The 10-year ASCVD risk score (Arnett DK, et al., 2019) is: 2.3%   Values used to calculate the score:     Age: 48 years     Sex: Female     Is Non-Hispanic African American: Yes     Diabetic: No     Tobacco smoker: No     Systolic Blood Pressure: 124 mmHg     Is BP treated: Yes     HDL Cholesterol: 64 mg/dL     Total Cholesterol: 207 mg/dL Discussed labs from last appointment today.  LDL still slightly elevated, HDL has gone up a bit.  Not on medication.  No change in treatment today- continue lifestyle changes.

## 2023-08-13 ENCOUNTER — Ambulatory Visit (INDEPENDENT_AMBULATORY_CARE_PROVIDER_SITE_OTHER): Admitting: Family Medicine

## 2023-08-14 ENCOUNTER — Other Ambulatory Visit: Payer: Self-pay | Admitting: *Deleted

## 2023-08-14 ENCOUNTER — Telehealth: Payer: Self-pay

## 2023-08-14 DIAGNOSIS — D0512 Intraductal carcinoma in situ of left breast: Secondary | ICD-10-CM

## 2023-08-14 NOTE — Telephone Encounter (Signed)
Pt verbally confirmed appt

## 2023-08-18 ENCOUNTER — Inpatient Hospital Stay: Payer: BC Managed Care – PPO

## 2023-08-18 ENCOUNTER — Inpatient Hospital Stay: Payer: BC Managed Care – PPO | Attending: Hematology and Oncology | Admitting: Hematology and Oncology

## 2023-08-18 VITALS — BP 143/96 | HR 86 | Temp 98.5°F | Resp 17 | Ht 66.0 in | Wt 266.7 lb

## 2023-08-18 DIAGNOSIS — D0511 Intraductal carcinoma in situ of right breast: Secondary | ICD-10-CM | POA: Diagnosis present

## 2023-08-18 DIAGNOSIS — Z170 Estrogen receptor positive status [ER+]: Secondary | ICD-10-CM | POA: Diagnosis not present

## 2023-08-18 DIAGNOSIS — D0512 Intraductal carcinoma in situ of left breast: Secondary | ICD-10-CM | POA: Insufficient documentation

## 2023-08-18 DIAGNOSIS — Z79810 Long term (current) use of selective estrogen receptor modulators (SERMs): Secondary | ICD-10-CM | POA: Diagnosis not present

## 2023-08-18 DIAGNOSIS — Z923 Personal history of irradiation: Secondary | ICD-10-CM | POA: Diagnosis not present

## 2023-08-18 DIAGNOSIS — I890 Lymphedema, not elsewhere classified: Secondary | ICD-10-CM | POA: Insufficient documentation

## 2023-08-18 LAB — CBC WITH DIFFERENTIAL (CANCER CENTER ONLY)
Abs Immature Granulocytes: 0.01 10*3/uL (ref 0.00–0.07)
Basophils Absolute: 0 10*3/uL (ref 0.0–0.1)
Basophils Relative: 1 %
Eosinophils Absolute: 0.2 10*3/uL (ref 0.0–0.5)
Eosinophils Relative: 3 %
HCT: 35.9 % — ABNORMAL LOW (ref 36.0–46.0)
Hemoglobin: 12 g/dL (ref 12.0–15.0)
Immature Granulocytes: 0 %
Lymphocytes Relative: 24 %
Lymphs Abs: 1.2 10*3/uL (ref 0.7–4.0)
MCH: 31.1 pg (ref 26.0–34.0)
MCHC: 33.4 g/dL (ref 30.0–36.0)
MCV: 93 fL (ref 80.0–100.0)
Monocytes Absolute: 0.4 10*3/uL (ref 0.1–1.0)
Monocytes Relative: 8 %
Neutro Abs: 3.2 10*3/uL (ref 1.7–7.7)
Neutrophils Relative %: 64 %
Platelet Count: 227 10*3/uL (ref 150–400)
RBC: 3.86 MIL/uL — ABNORMAL LOW (ref 3.87–5.11)
RDW: 12.9 % (ref 11.5–15.5)
WBC Count: 5 10*3/uL (ref 4.0–10.5)
nRBC: 0 % (ref 0.0–0.2)

## 2023-08-18 LAB — CMP (CANCER CENTER ONLY)
ALT: 17 U/L (ref 0–44)
AST: 12 U/L — ABNORMAL LOW (ref 15–41)
Albumin: 4.1 g/dL (ref 3.5–5.0)
Alkaline Phosphatase: 41 U/L (ref 38–126)
Anion gap: 4 — ABNORMAL LOW (ref 5–15)
BUN: 12 mg/dL (ref 6–20)
CO2: 30 mmol/L (ref 22–32)
Calcium: 9.1 mg/dL (ref 8.9–10.3)
Chloride: 108 mmol/L (ref 98–111)
Creatinine: 0.63 mg/dL (ref 0.44–1.00)
GFR, Estimated: >60 mL/min (ref >60)
Glucose, Bld: 98 mg/dL (ref 70–99)
Potassium: 4.1 mmol/L (ref 3.5–5.1)
Sodium: 142 mmol/L (ref 135–145)
Total Bilirubin: 0.3 mg/dL (ref 0.0–1.2)
Total Protein: 6.9 g/dL (ref 6.5–8.1)

## 2023-08-18 NOTE — Progress Notes (Signed)
 [facility] CONSULT NOTE  Patient Care Team: Elester Grim, MD as PCP - General (Internal Medicine) Murleen Arms, MD as Consulting Physician (Hematology and Oncology) Caralyn Chandler, MD as Consulting Physician (General Surgery) Johna Myers, MD as Consulting Physician (Radiation Oncology) Dillingham, Lindaann Requena, DO as Attending Physician (Plastic Surgery)  CHIEF COMPLAINTS/PURPOSE OF CONSULTATION:  Breast cancer  HISTORY OF PRESENTING ILLNESS:  Joan Dalton 48 y.o. female is here because of recent diagnosis of left breast cancer  I reviewed her records extensively and collaborated the history with the patient.  SUMMARY OF ONCOLOGIC HISTORY: Oncology History  Ductal carcinoma in situ (DCIS) of left breast  05/20/2021 Initial Diagnosis   Ductal carcinoma in situ (DCIS) of left breast   06/05/2021 Genetic Testing   Negative genetic testing on the CancerNext-Expanded+RNAinsight panel.  The report date is June 05, 2021.  The CancerNext-Expanded gene panel offered by [facility] and includes sequencing and rearrangement analysis for the following 77 genes: AIP, ALK, APC*, ATM*, AXIN2, BAP1, BARD1, BLM, BMPR1A, BRCA1*, BRCA2*, BRIP1*, CDC73, CDH1*, CDK4, CDKN1B, CDKN2A, CHEK2*, CTNNA1, DICER1, FANCC, FH, FLCN, GALNT12, KIF1B, LZTR1, MAX, MEN1, MET, MLH1*, MSH2*, MSH3, MSH6*, MUTYH*, NBN, NF1*, NF2, NTHL1, PALB2*, PHOX2B, PMS2*, POT1, PRKAR1A, PTCH1, PTEN*, RAD51C*, RAD51D*, RB1, RECQL, RET, SDHA, SDHAF2, SDHB, SDHC, SDHD, SMAD4, SMARCA4, SMARCB1, SMARCE1, STK11, SUFU, TMEM127, TP53*, TSC1, TSC2, VHL and XRCC2 (sequencing and deletion/duplication); EGFR, EGLN1, HOXB13, KIT, MITF, PDGFRA, POLD1, and POLE (sequencing only); EPCAM and GREM1 (deletion/duplication only). DNA and RNA analyses performed for * genes.    06/17/2021 Surgery   Patient had left breast lumpectomy which showed high-grade ductal carcinoma in situ with necrosis and calcifications, resection margins are  negative for carcinoma left additional superior margin excision again negative for carcinoma.  She had 3 out of 3 lymph nodes without involvement.  Prior prognostic showed ER 85% positive strong staining PR 95% positive strong staining. She is now undergoing plastic surgery, had right and left breast mammoplasty with no evidence of malignancy.   06/17/2021 Cancer Staging   Staging form: Breast, AJCC 8th Edition - Clinical stage from 06/17/2021: Stage 0 (cTis (DCIS), cN0, cM0, ER+, PR+) - Signed by Percival Brace, NP on 01/30/2022   08/28/2021 - 10/16/2021 Radiation Therapy   Site Technique Total Dose (Gy) Dose per Fx (Gy) Completed Fx Beam Energies  Breast, Left: Breast_L 3D 50.4/50.4 1.8 28/28 10XFFF  Breast, Left: Breast_L_Bst 3D 10/10 2 5/5 6X, 10X     10/2021 -  Anti-estrogen oral therapy   Tamoxifen      History of Present Illness         The patient, with a history of breast cancer and blood clots, presents with concerns about prediabetes, potential glaucoma, and weight management.   Discussed the use of AI scribe software for clinical note transcription with the patient, who gave verbal consent to proceed.  History of Present Illness Joan Dalton is a 48 year old female with breast cancer while on tamoxifen  who presents for follow-up regarding weight gain and medication management. She is accompanied by her sister, Joan Dalton.  She has experienced significant weight gain since starting tamoxifen , currently weighing 266 pounds, which exceeds her weight during any of her pregnancies. She attributes some of the weight gain to dietary inconsistencies and has attempted a pescetarian meal plan with limited success. She has also tried fasting, which resulted in weight loss, but she finds it challenging to maintain dietary discipline consistently. Her lab work and blood pressure  are reportedly good.  She is currently on tamoxifen , which she started in August 2023, and is expected to  continue until August 2028. She is also taking Eliquis  for VTE prophylaxis. She inquires about her menopausal status, noting that she was not in menopause as of 2023.  She mentions experiencing some tightness and swelling in her left arm, which she attributes to lymphedema. She has not been consistent with exercises or physical therapy but acknowledges that previous physical therapy was beneficial.  Her mammogram was last performed in January 2025, with a follow-up scheduled for January 2026. She has not been seeing her breast surgeon regularly.   Rest of the pertinent 10 point ROS reviewed and negative  MEDICAL HISTORY:  Past Medical History:  Diagnosis Date   Anemia    Anxiety    Bilateral swelling of feet    Breast cancer (HCC) 05/08/2021   Constipation    Family history of breast cancer    Family history of colon cancer    Family history of pancreatic cancer    Fatty liver    Hx of blood clots    Hypertension    Joint pain    Joint pain    Lactose intolerance    Palpitations    Personal history of radiation therapy    Vitamin D  deficiency     SURGICAL HISTORY: Past Surgical History:  Procedure Laterality Date   ABLATION     D&C Ablation of Uterus   AXILLARY SENTINEL NODE BIOPSY Left 06/17/2021   Procedure: LEFT AXILLARY SENTINEL NODE BIOPSY;  Surgeon: Caralyn Chandler, MD;  Location: [facility] OR;  Service: General;  Laterality: Left;   BREAST BIOPSY Left 05/08/2021   BREAST BIOPSY Right 05/07/2022   US  RT BREAST BX W LOC DEV 1ST LESION IMG BX SPEC US  GUIDE 05/07/2022 [facility] MAMMOGRAPHY   BREAST LUMPECTOMY Right 10/1998   BREAST LUMPECTOMY WITH RADIOACTIVE SEED AND SENTINEL LYMPH NODE BIOPSY Left 06/17/2021   Procedure: LEFT BREAST LUMPECTOMY WITH RADIOACTIVE SEEDS x 2;  Surgeon: Caralyn Chandler, MD;  Location: [facility] OR;  Service: General;  Laterality: Left;   BREAST REDUCTION SURGERY Bilateral 07/24/2021   Procedure: BILATERAL ONCOPLASTIC BREAST REDUCTION;  Surgeon: Thornell Flirt, DO;  Location: [facility] OR;  Service: Plastics;  Laterality: Bilateral;   MASTOPEXY Bilateral 07/24/2021   Procedure: MASTOPEXY;  Surgeon: Thornell Flirt, DO;  Location: [facility] OR;  Service: Plastics;  Laterality: Bilateral;   REDUCTION MAMMAPLASTY      SOCIAL HISTORY: Social History   Socioeconomic History   Marital status: Married    Spouse name: GARLAND SMOUSE II   Number of children: 5   Years of education: Not on file   Highest education level: Not on file  Occupational History   Not on file  Tobacco Use   Smoking status: Never   Smokeless tobacco: Never  Vaping Use   Vaping status: Never Used  Substance and Sexual Activity   Alcohol use: Never   Drug use: Never   Sexual activity: Yes  Other Topics Concern   Not on file  Social History Narrative   Not on file   Social Drivers of Health   Financial Resource Strain: Not on file  Food Insecurity: Not on file  Transportation Needs: Not on file  Physical Activity: Not on file  Stress: Not on file  Social Connections: Not on file  Intimate Partner Violence: Not At Risk (05/21/2021)   Humiliation, Afraid, Rape, and Kick questionnaire  Fear of Current or Ex-Partner: No    Emotionally Abused: No    Physically Abused: No    Sexually Abused: No    FAMILY HISTORY: Family History  Problem Relation Age of Onset   Breast cancer Mother        4s & 54s   Hypertension Father    Hyperlipidemia Father    Diabetes Father    Prostate cancer Father    Heart disease Father    Sleep apnea Father    Obesity Father    Colon cancer Maternal Grandfather    Breast cancer Maternal Aunt 46   Pancreatic cancer Maternal Aunt 6   Breast cancer Maternal Aunt        later 30s-early 64s   Colon cancer Maternal Aunt 42   Colon cancer Maternal Uncle        60s   Pancreatic cancer Cousin        mother's maternal first cousin   Pancreatic cancer Cousin        mother's maternal first cousin    ALLERGIES:  is allergic to  sulfamethoxazole-trimethoprim.  MEDICATIONS:  Current Outpatient Medications  Medication Sig Dispense Refill   dorzolamide-timolol (COSOPT) 2-0.5 % ophthalmic solution Apply to eye.     ELIQUIS  5 MG TABS tablet TAKE 1 TABLET BY MOUTH TWICE A DAY 60 tablet 4   latanoprost (XALATAN) 0.005 % ophthalmic solution Apply to eye.     metFORMIN  (GLUCOPHAGE ) 1000 MG tablet Take 1 tablet (1,000 mg total) by mouth daily with breakfast. 30 tablet 0   Multiple Vitamin (MULTI-VITAMIN) tablet Take 1 tablet by mouth daily.     Probiotic Product (PROBIOTIC-10 PO) Take by mouth.     propranolol (INDERAL) 20 MG tablet Take 20 mg by mouth 2 (two) times daily as needed.     tamoxifen  (NOLVADEX ) 20 MG tablet TAKE 1 TABLET BY MOUTH EVERY DAY 90 tablet 1   Vitamin D , Ergocalciferol , (DRISDOL ) 1.25 MG (50000 UNIT) CAPS capsule Take 1 capsule (50,000 Units total) by mouth every 7 (seven) days. 12 capsule 0   No current facility-administered medications for this visit.     PHYSICAL EXAMINATION: ECOG PERFORMANCE STATUS: 0 - Asymptomatic  Vitals:   08/18/23 0933 08/18/23 0934  BP: (!) 150/96 (!) 143/96  Pulse: 86   Resp: 17   Temp: 98.5 F (36.9 C)   SpO2: 99%      Filed Weights   08/18/23 0933  Weight: 266 lb 11.2 oz (121 kg)     Physical Exam Constitutional:      Appearance: Normal appearance.  Chest:     Comments: Bilateral breasts inspected and palpated. Left breast with post radiation changes. No palpable masses. Mild LUE swelling noted. No regional adenopathy Neurological:     Mental Status: She is alert.      LABORATORY DATA:  I have reviewed the data as listed Lab Results  Component Value Date   WBC 5.0 08/18/2023   HGB 12.0 08/18/2023   HCT 35.9 (L) 08/18/2023   MCV 93.0 08/18/2023   PLT 227 08/18/2023   Lab Results  Component Value Date   NA 142 06/08/2023   K 4.3 06/08/2023   CL 104 06/08/2023   CO2 23 06/08/2023    RADIOGRAPHIC STUDIES: I have personally reviewed  the radiological reports and agreed with the findings in the report.  ASSESSMENT AND PLAN:   This is a very pleasant 48 year old female patient (menopausal status pre menopausal, last period Several years ago but  had endometrial ablation) referred to hematology/oncology for DCIS of the right breast and recommendations regarding the above.  She has now completed adjuvant radiation and is here on tamoxifen . Her annual diagnostic mammogram is on Apr 16, 2023.  Assessment and Plan Assessment & Plan Breast cancer, post-treatment Post-treatment changes with mild left upper extremity swelling. Mammogram in January 2025, follow-up in January 2026. On tamoxifen  since August 2023, to continue until August 2028. - Continue tamoxifen  until August 2028. - Order mammogram for January 2026. - Schedule follow-up in six months unless new symptoms arise.  Lymphedema of left arm Mild swelling likely due to post-treatment lymphedema. Lack of recent exercises or massages may have contributed. - Recommend resuming physical therapy exercises and massages for lymphedema management.  Weight gain Weight gain since starting tamoxifen , current weight 266 lbs. Dietary modifications effective when adhered to. Emphasized importance of dietary modifications and hydration. - Encourage consistent dietary modifications and increased water intake.  Follow-up in 6 months.  Total time spent 30 minutes including review of history, records, counseling about antiestrogen therapy options, coordination of care  Thank you for consulting us  in the care of this patient.  Please not hesitate to contact us  with any additional questions or concerns.

## 2023-08-31 ENCOUNTER — Telehealth: Payer: Self-pay | Admitting: *Deleted

## 2023-08-31 NOTE — Telephone Encounter (Signed)
 Received fax from [facility] asking if patient can hold Eliquis  for 2 days prior to Colonoscopy scheduled on 09/28/23.  Per Dr. Arno Bibles, can hold for 72 hours before procedure. Dr. Arno Bibles requested patient be contacted prior to sending fax to [facility] to inform/discuss that there is a small risk of DVT/PE while she is off anticoagulant.   Contacted patient to provide information as directed by Dr. Arno Bibles. No answer and Mailbox full.  Routing this message to contact patient to Dr. Lorella Roles support staff.

## 2023-09-01 ENCOUNTER — Telehealth: Payer: Self-pay | Admitting: *Deleted

## 2023-09-01 NOTE — Telephone Encounter (Signed)
 This RN called pt per pending colonoscopy and need to instruct regard Eliquis ,  Informed pt to hold for 72 hours before procedure and then resume the day post unless having bleeding.  Pt verbalized understanding.  No further needs at this time.

## 2023-09-21 ENCOUNTER — Ambulatory Visit (INDEPENDENT_AMBULATORY_CARE_PROVIDER_SITE_OTHER): Admitting: Family Medicine

## 2023-09-21 ENCOUNTER — Encounter (INDEPENDENT_AMBULATORY_CARE_PROVIDER_SITE_OTHER): Payer: Self-pay | Admitting: Family Medicine

## 2023-09-21 VITALS — BP 131/85 | HR 98 | Temp 98.3°F | Ht 66.0 in | Wt 267.0 lb

## 2023-09-21 DIAGNOSIS — E669 Obesity, unspecified: Secondary | ICD-10-CM

## 2023-09-21 DIAGNOSIS — Z6841 Body Mass Index (BMI) 40.0 and over, adult: Secondary | ICD-10-CM

## 2023-09-21 DIAGNOSIS — E7849 Other hyperlipidemia: Secondary | ICD-10-CM | POA: Diagnosis not present

## 2023-09-21 DIAGNOSIS — R7303 Prediabetes: Secondary | ICD-10-CM

## 2023-09-21 NOTE — Assessment & Plan Note (Addendum)
 Last A1c elevated.  She held off on metformin  due to conflicting things she heard.  She will get back on the meal plan more consistently and will get labs done at next appointment.

## 2023-09-21 NOTE — Progress Notes (Signed)
 SUBJECTIVE:  Chief Complaint: Obesity  Interim History: Patient has been trying to get life organized.  She has started going back to the gym again.  She started doing cardio again and is planning to start lifting again.  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  She is getting up early- getting to gym early in the am then going to the gym in the early afternoons if she doesn't have a chance in the am.  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  She is doing some teach assisting.  Wants to do a liquid fast to help reset with her husband.  Merril is here to discuss her progress with her obesity treatment plan. She is on the [employer] and states she is following her eating plan approximately 30 % of the time. She states she is exercising 60 minutes 5 times per week.   OBJECTIVE: Visit Diagnoses: Problem List Items Addressed This Visit       Other   Prediabetes - Primary   Last A1c elevated.  She held off on metformin  due to conflicting things she heard.  She will get back on the meal plan more consistently and will get labs done at next appointment.      BMI 40.0-44.9, adult (HCC)   Obesity, Beginning BMI 44.39   Hyperlipidemia    Vitals Temp: 98.3 F (36.8 C) BP: 131/85 Pulse Rate: 98 SpO2: 97 %   Anthropometric Measurements Height: 5' 6 (1.676 m) Weight: 267 lb (121.1 kg) BMI (Calculated): 43.12 Weight at Last Visit: 257lb Weight Lost Since Last Visit: 0 Weight Gained Since Last Visit: 10lb Starting Weight: 275lb Total Weight Loss (lbs): 8 lb (3.629 kg)   Body Composition  Body Fat %: 51.3 % Fat Mass (lbs): 137.2 lbs Muscle Mass (lbs): 123.6 lbs Total Body Water (lbs): 103.2 lbs Visceral Fat Rating : 16   Other Clinical Data Fasting: no Labs: no Today's Visit #: 18 Starting Date: 11/05/21     Assessment & Plan Prediabetes Last A1c elevated.  She held off on  metformin  due to conflicting things she heard.  She will get back on the meal plan more consistently and will get labs done at next appointment. Other hyperlipidemia Prior labs done in March show elevated LDL.  Patient has been working on monitoring her food intake to limit saturated fat intake to less than 20% of her daily diet (already in pescatarian plan).  Will continue current meal plan and needs repeat labs in August. Obesity, Beginning BMI 44.39  BMI 40.0-44.9, adult (HCC)     Diet: Felisha is currently in the action stage of change. As such, her goal is to continue with weight loss efforts and has agreed to the [employer].  Patient is planning for a liquid fast that will be followed by getting back on pescatarian.    Exercise:  For substantial health benefits, adults should do at least 150 minutes (2 hours and 30 minutes) a week of moderate-intensity, or 75 minutes (1 hour and 15 minutes) a week of vigorous-intensity aerobic physical activity, or an equivalent combination of moderate- and vigorous-intensity aerobic activity. Aerobic activity should be performed in episodes of at least 10 minutes, and preferably, it should be spread throughout the week.  Behavior Modification:  We discussed the following Behavioral Modification Strategies today: increasing lean protein intake, decreasing simple carbohydrates, increasing vegetables, meal planning and cooking strategies, keeping healthy foods in the  home, and planning for success.   Return in about 4 weeks (around 10/19/2023) for fasting labs and IC.   She was informed of the importance of frequent follow up visits to maximize her success with intensive lifestyle modifications for her multiple health conditions.  Attestation Statements:   Reviewed by clinician on day of visit: allergies, medications, problem list, medical history, surgical history, family history, social history, and previous encounter notes.     [name], MD

## 2023-09-27 NOTE — Assessment & Plan Note (Signed)
 Prior labs done in March show elevated LDL.  Patient has been working on monitoring her food intake to limit saturated fat intake to less than 20% of her daily diet (already in pescatarian plan).  Will continue current meal plan and needs repeat labs in August.

## 2023-10-16 NOTE — Progress Notes (Signed)
 Referred on 02/18/2023 by Dr. Jennye for glaucoma eval   Prior Ocular Lasers and Surgeries: - s/p SLT OS 03/04/2023 (Quist, limited effect)  Ocular Medication Intol and Non-efficacy: N/A  Current Ophthalmic Meds: See below  Imaging: -HVF 24-2 02/18/2023 -OD: reliable, full, no prior to compare -OS: reliable, sup/inf NS/dense arcuate (inf>sup), encroaching on central fixation  -OCT RNFL 10/16/2023  -OD: 90, N borderline, stable to 01/2023  -OS: 47, diffuse thinning, slightly worse than 01/2023 -OCT Mac 10/16/2023  -OD: wnl -OS: tr schisis temporally    Assessment and Plan:  #Primary open angle glaucoma, severe stage, left eye(s) #Glaucoma suspect high risk right eye - History: - Diagnosed: 2024 - Initially referred to Dr. Erla by Dr. Jennye for glaucoma eval  - Occupation: full time student - FH: paternal aunt - Steroids: none - Trauma: none - Other: NO history of sleep apnea, low BP, migraine, raynaud's, or profound hemorrhage - Color: 10/10 OD, 10/10 OS on 02/18/2023  - Gonio: PTM/SS OD, PTM/bare SS OS on 02/18/2023 - Pachy: 538/507 on 02/18/2023  - Tmax: unknown - OD notes: N/A - OS notes: N/A - IOP target: 15 OS - Plan: - IOP today: 14/20 from 11/13 prior to SLT  - Current Ophthalmic Meds: latan 1/1, cosopt 0/2 (pt admits to only getting drops in about 85% of the time) - IOP too high OS - Pt taking phentermine -topiramate  for weight loss - Pt taking tomoxifen as per below - S/p radiation on left breast - RNFL 02/18/2023 with severe thinning OS, fairly normal OD - HVF 02/18/2023  with severe defects OS only  - advised pt to stay off phentermine -topiramate  given moderate angles, possible intermittent closure though unlikely  - discussed with pt about likely diagnosis of glaucoma vs less likely non-glaucomatous etiology - Low suspicion for non-glaucomatous etiology given normal color vision, no pallor on nerve and typical arcuate defects on HVF - possible this  could be related to left sided radiation but less likely and also no asymmetric cataract as of now  - Will start drops and treat OD as well to protect against any damage  - if IOP does not improve or if continued progression at low pressures with drops will consider MRI and full non-glaucomatous workup  - s/p SLT OS 03/04/2023 (Quist, limited effect) - stop Ketorolac   - 10/16/2023 RNFL slightly worse OS, IOP too high OS, will add brim. Discussed importance of drop compliance - Med Plan: latanoprost 1/1, Cosopt 0/2, brimonidine 0/3 - Return as per below    #Cataract OU - Very trace, not visually significant - Not asymmetric - Continue to monitor - Continue with current glasses   #H/o breast cancer - s/p radiation therapy left breast (completed 09/2022) - on tamoxifen   - on blood thinner   #Dry Eye Syndrome, both eyes - Preservative Free Artificial Tears (PFAT) 4-6x daily,    Return 4 weeks for IOP check OU and HVF 24-2 OS   The Chief Complaint, HPI, ROS, and PFSHx as documented were reviewed with the patient and updated as appropriate. Pertinent findings were discussed with the patient and all questions were answered.   Ozell CANDIE Erla, M.D. Glaucoma Division  [facility] Phone: [phone] Fax: [phone]  This note is written by Lucienne Ellen, in the presence of and acting as the scribe of Dr. Ozell CANDIE Erla, MD.   Commonly used ophthalmology abbreviations:  Medication Shorthand: MEDICATION NAME A/B = A times per day OD & B times per day OS. Example 1:  Timolol 1/1 = Timolol QD OU. Example 2: Dorzolamide 2/1 = Dorzolamide BID OD & QD OS.   AC: Anterior chamber; ACIOL: Anterior chamber intraocular lens; APD, RAPD: (Relative) Afferent pupillary defect; ARMD, AMD: Age-related macular degeneration; ASC: Anterior subcapsular cataract; ATM: Anterior trabecular meshwork; BRAO/BRVO: Branch retinal artery/vein occlusion; BCL: bandage contact lens; cc: With  correction; CDR: Cup disk ratio; CE/IOL: Cataract extraction with intraocular lens implant; CF: Counting fingers; CL, CTL: Contact lens; CME: Cystoid macular edema; CNV, CNVM: Choroidal neovascularization; CRAO/CRVO: Central retinal artery/vein occlusion; CRS: Chorioretinal scar; CR, CRX: Cycloplegic refraction; DES: Dry eye syndrome; DME: Diabetic macular edema; DR: Diabetic retinopathy; ERM: Epiretinal membrane; ET: esotropia; GDI/GDD: Glaucoma drainage implant/device; HM: Hand motions; HVF: Humphrey visual field; IOL: Intraocular lens; IOP: Intraocular pressure; IRF: Intra-retinal fluid; IRH: Intra-retinal hemorrhage; K: Cornea or Keratometry; KP; Keratic precipitates; LP: Light perception; LPI: Laser peripheral iridotomy; MA: Microaneurysm; MGD: Meibomian gland dysfunction; MH: Macular hole; MP: Membrane peeling or macular pucker; MR, MRX: Manifest refraction; NAION: Non-arteritic ischemic optic neuropathy; NAS: No angle structures; NLP: No light perception; NPDR: Nonproliferative diabetic retinopathy; NVA/NVD/NVE: Neovascularization of the angle/disc/elsewhere; NVG: Neovascular glaucoma; NVI: Neovascularization of iris (rubeosis iridis); OCT: Optical coherence tomography; OD: Oculus dexter (right eye); OS: Oculus sinister (left eye); OU: Oculus uterque (both eyes); PAS: Peripheral anterior synechiae; PC: Posterior chamber; PCIOL: Posterior chamber intraocular lens; PCO: Posterior capsule opacity; PDR: Proliferative diabetic retinopathy; PEE; Punctate epithelial erosion; PERRL(A): Pupils equal, round, reactive to light and accommodation; PH: Pinhole; PI: Peripheral iridotomy; PK, PKP: Penetrating keratoplasty; POAG: Primary open-angle glaucoma; PPA: Peripapillary atrophy; PPV: Pars plana vitrectomy; PRP: Panretinal photocoagulation; PS: Posterior synechiae; PSC: Posterior subcapsular cataract; PTM: Posterior trabecular meshwork; PVD: Posterior vitreous detachment; PVR: Proliferative vitreoretinopathy; (R)RD:  (rhegmatogenous) Retinal detachment; SB: Scleral buckle; Bureau: Without correction; SLT: Selective laser trabeculoplasty; SO, SiO: Silicone oil; SPK: Superficial punctate keratopathy; SRF/SRH: Subretinal fluid/hemorrhage; SS: Scleral spur; TASS: Toxic Anterior Segment Syndrome; TBUT: Tear breakup time; TED: Thyroid eye disease; TM: Trabecular meshwork; VA: Visual acuity; VF: Visual field; VH: Vitreous hemorrhage; VS: Visually significant; XT: Exotropia

## 2023-10-23 ENCOUNTER — Other Ambulatory Visit: Payer: Self-pay | Admitting: Hematology and Oncology

## 2023-10-27 ENCOUNTER — Telehealth: Payer: Self-pay | Admitting: *Deleted

## 2023-10-27 NOTE — Telephone Encounter (Signed)
 Pt called to this RN per onset x 3 days of swelling in upper left neck/cervical lymph nodes- which is on the same side as her LND x 3.  Pt is also on tamoxifen .  She states area is enlarged and mushy.  She has some mild sinus drainage.  She states neck is painful to sleep with some pain running down left arm.  Per need to assess for lymphedema or other issue for appropriate recommendations.  This RN did suggest until visit tomorrow she can try sucking on sour candy/lemons- to aid in possible opening of any salivary glands.  Appt obtained per [facility],

## 2023-10-28 ENCOUNTER — Telehealth: Payer: Self-pay | Admitting: *Deleted

## 2023-10-28 ENCOUNTER — Ambulatory Visit (INDEPENDENT_AMBULATORY_CARE_PROVIDER_SITE_OTHER): Admitting: Family Medicine

## 2023-10-28 ENCOUNTER — Encounter: Admitting: Physician Assistant

## 2023-10-28 ENCOUNTER — Inpatient Hospital Stay: Attending: Hematology and Oncology | Admitting: Physician Assistant

## 2023-10-28 VITALS — BP 155/97 | HR 93 | Temp 98.0°F | Resp 18 | Wt 283.1 lb

## 2023-10-28 DIAGNOSIS — Z923 Personal history of irradiation: Secondary | ICD-10-CM | POA: Diagnosis not present

## 2023-10-28 DIAGNOSIS — R590 Localized enlarged lymph nodes: Secondary | ICD-10-CM

## 2023-10-28 DIAGNOSIS — M7989 Other specified soft tissue disorders: Secondary | ICD-10-CM | POA: Diagnosis not present

## 2023-10-28 DIAGNOSIS — D0512 Intraductal carcinoma in situ of left breast: Secondary | ICD-10-CM

## 2023-10-28 DIAGNOSIS — K0889 Other specified disorders of teeth and supporting structures: Secondary | ICD-10-CM

## 2023-10-28 DIAGNOSIS — D0511 Intraductal carcinoma in situ of right breast: Secondary | ICD-10-CM | POA: Insufficient documentation

## 2023-10-28 DIAGNOSIS — Z170 Estrogen receptor positive status [ER+]: Secondary | ICD-10-CM | POA: Insufficient documentation

## 2023-10-28 DIAGNOSIS — Z79810 Long term (current) use of selective estrogen receptor modulators (SERMs): Secondary | ICD-10-CM | POA: Diagnosis not present

## 2023-10-28 MED ORDER — CLINDAMYCIN HCL 150 MG PO CAPS
450.0000 mg | ORAL_CAPSULE | Freq: Three times a day (TID) | ORAL | 0 refills | Status: AC
Start: 2023-10-28 — End: 2023-11-04

## 2023-10-28 NOTE — Progress Notes (Unsigned)
 Symptom Management Consult Note [facility]    Patient Care Team: Vernon Velna SAUNDERS, MD as PCP - General (Internal Medicine) Loretha Ash, MD as Consulting Physician (Hematology and Oncology) Curvin Deward MOULD, MD as Consulting Physician (General Surgery) Dewey Rush, MD as Consulting Physician (Radiation Oncology) Dillingham, Estefana RAMAN, DO as Attending Physician (Plastic Surgery)    Name / MRN / DOBMellanie Dalton  [id number]  August 28, 1975   Date of visit: 10/28/2023   Chief Complaint/Reason for visit: left neck swelling   Current Therapy: Tamoxifen  until August 2028      ASSESSMENT AND PLAN Patient is a 48 y.o. 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 Loretha.  I have viewed most recent oncology note and lab work.  #Ductal carcinoma in situ (DCIS) of left breast  - Next appointment with oncologist is 02/22/24  #Left neck swelling - Acute onset of swelling and pain of left neck, shoulder, and left upper extremity with suspected lymphedema flare Persistent swelling and pain in the left neck, shoulder, and upper extremity, likely a lymphedema flare. History of lymphedema and recent increased physical activity and travel may have contributed.  - Refer to lymphedema clinic for evaluation and management. - STAT US  of LUE obtained and is negative for DVT.  #Dental pain - Recent dental infection. No antibiotics prescribed post recent dental procedure. Still having mild dental pain. Plans to follow up in [place]  for recheck. - Prescribe clindamycin  for dental infection vs lymphadenopathy, considering possible bacterial infection.  -Will have patient return to clinic in one week for recheck. Would consider labs and further imaging if no improvement. Will update oncologist of symptoms.  Strict ED precautions discussed should symptoms worsen.   HEME/ONC HISTORY Oncology History  Ductal carcinoma in situ  (DCIS) of left breast  05/20/2021 Initial Diagnosis   Ductal carcinoma in situ (DCIS) of left breast   06/05/2021 Genetic Testing   Negative genetic testing on the CancerNext-Expanded+RNAinsight panel.  The report date is June 05, 2021.  The CancerNext-Expanded gene panel offered by [facility] and includes sequencing and rearrangement analysis for the following 77 genes: AIP, ALK, APC*, ATM*, AXIN2, BAP1, BARD1, BLM, BMPR1A, BRCA1*, BRCA2*, BRIP1*, CDC73, CDH1*, CDK4, CDKN1B, CDKN2A, CHEK2*, CTNNA1, DICER1, FANCC, FH, FLCN, GALNT12, KIF1B, LZTR1, MAX, MEN1, MET, MLH1*, MSH2*, MSH3, MSH6*, MUTYH*, NBN, NF1*, NF2, NTHL1, PALB2*, PHOX2B, PMS2*, POT1, PRKAR1A, PTCH1, PTEN*, RAD51C*, RAD51D*, RB1, RECQL, RET, SDHA, SDHAF2, SDHB, SDHC, SDHD, SMAD4, SMARCA4, SMARCB1, SMARCE1, STK11, SUFU, TMEM127, TP53*, TSC1, TSC2, VHL and XRCC2 (sequencing and deletion/duplication); EGFR, EGLN1, HOXB13, KIT, MITF, PDGFRA, POLD1, and POLE (sequencing only); EPCAM and GREM1 (deletion/duplication only). DNA and RNA analyses performed for * genes.    06/17/2021 Surgery   Patient had left breast lumpectomy which showed high-grade ductal carcinoma in situ with necrosis and calcifications, resection margins are negative for carcinoma left additional superior margin excision again negative for carcinoma.  She had 3 out of 3 lymph nodes without involvement.  Prior prognostic showed ER 85% positive strong staining PR 95% positive strong staining. She is now undergoing plastic surgery, had right and left breast mammoplasty with no evidence of malignancy.   06/17/2021 Cancer Staging   Staging form: Breast, AJCC 8th Edition - Clinical stage from 06/17/2021: Stage 0 (cTis (DCIS), cN0, cM0, ER+, PR+) - Signed by Crawford Morna Pickle, NP on 01/30/2022   08/28/2021 - 10/16/2021 Radiation Therapy   Site Technique Total Dose (Gy) Dose  per Fx (Gy) Completed Fx Beam Energies  Breast, Left: Breast_L 3D 50.4/50.4 1.8 28/28 10XFFF  Breast,  Left: Breast_L_Bst 3D 10/10 2 5/5 6X, 10X     10/2021 -  Anti-estrogen oral therapy   Tamoxifen        INTERVAL HISTORY  Discussed the use of AI scribe software for clinical note transcription with the patient, who gave verbal consent to proceed.    Joan Dalton is a 48 y.o. female with oncologic history as above presenting to [facility] today with chief complaint of  left neck swelling. Patient presents unaccompanied to visit today.  Approximately one week ago, she developed a 'crooked neck' which progressed to tenderness and swelling extending from the back of her neck to left arm. She denies any difficulty swallowing. The swelling is described as puffy and tender, with a knot-like sensation in the neck area. She uses Biofreeze, Tylenol  pain cream, and Icy Hot, and takes Tylenol , particularly at night, to manage the pain. She reports dull, non-localized pain in her arm. She has a history of lymphedema and was previously treated successfully at a lymphedema clinic in 2024. She recently started wearing her compressions compression bra as a precaution due to past lymphedema and concern for possible travel-related swelling.   She underwent a recent dental procedure in [place]  for a food trap left by a previous filling, where the dentist noted something 'brewing' but did not prescribe antibiotics. She has been traveling back and forth to [place]  frequently recently. They typically drive and will take breaks every few hours to stretch their legs. 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 She has not experienced any further blood clots. She is mostly compliant with Eliquis , sometimes will miss an evening dose. No respiratory symptoms such as shortness of breath or chest pain. She has noticed leg swelling in the last week. She had been eating a high salt diet and plans to restrict salt intake moving forward. Leg swelling improves with elevation.   She recently resumed gym activities, including cardio and  light strength training, but has not returned since the onset of her current symptoms. She admits to postnasal drip last week although denies any significant cold symptoms. She denies any fevers or sick contacts. She denies any breast pain or changes. Last lymphademia clinic appt was on 05/28/22  ROS  All other systems are reviewed and are negative for acute change except as noted in the HPI.    Allergies  Allergen Reactions   Sulfamethoxazole-Trimethoprim Dermatitis and Itching     Past Medical History:  Diagnosis Date   Anemia    Anxiety    Bilateral swelling of feet    Breast cancer (HCC) 05/08/2021   Constipation    Family history of breast cancer    Family history of colon cancer    Family history of pancreatic cancer    Fatty liver    Hx of blood clots    Hypertension    Joint pain    Joint pain    Lactose intolerance    Palpitations    Personal history of radiation therapy    Vitamin D  deficiency      Past Surgical History:  Procedure Laterality Date   ABLATION     D&C Ablation of Uterus   AXILLARY SENTINEL NODE BIOPSY Left 06/17/2021   Procedure: LEFT AXILLARY SENTINEL NODE BIOPSY;  Surgeon: Curvin Deward MOULD, MD;  Location: [facility] OR;  Service: General;  Laterality: Left;   BREAST BIOPSY Left  05/08/2021   BREAST BIOPSY Right 05/07/2022   US  RT BREAST BX W LOC DEV 1ST LESION IMG BX SPEC US  GUIDE 05/07/2022 [facility]   BREAST LUMPECTOMY Right 10/1998   BREAST LUMPECTOMY WITH RADIOACTIVE SEED AND SENTINEL LYMPH NODE BIOPSY Left 06/17/2021   Procedure: LEFT BREAST LUMPECTOMY WITH RADIOACTIVE SEEDS x 2;  Surgeon: Curvin Deward MOULD, MD;  Location: [facility] OR;  Service: General;  Laterality: Left;   BREAST REDUCTION SURGERY Bilateral 07/24/2021   Procedure: BILATERAL ONCOPLASTIC BREAST REDUCTION;  Surgeon: Lowery Estefana RAMAN, DO;  Location: [facility] OR;  Service: Plastics;  Laterality: Bilateral;   MASTOPEXY Bilateral 07/24/2021   Procedure: MASTOPEXY;  Surgeon: Lowery Estefana RAMAN, DO;  Location: [facility] OR;  Service: Plastics;  Laterality: Bilateral;   REDUCTION MAMMAPLASTY      Social History   Socioeconomic History   Marital status: Married    Spouse name: ALESANDRA SMART II   Number of children: 5   Years of education: Not on file   Highest education level: Not on file  Occupational History   Not on file  Tobacco Use   Smoking status: Never   Smokeless tobacco: Never  Vaping Use   Vaping status: Never Used  Substance and Sexual Activity   Alcohol use: Never   Drug use: Never   Sexual activity: Yes  Other Topics Concern   Not on file  Social History Narrative   Not on file   Social Drivers of Health   Financial Resource Strain: Not on file  Food Insecurity: Not on file  Transportation Needs: Not on file  Physical Activity: Not on file  Stress: Not on file  Social Connections: Not on file  Intimate Partner Violence: Not At Risk (05/21/2021)   Humiliation, Afraid, Rape, and Kick questionnaire    Fear of Current or Ex-Partner: No    Emotionally Abused: No    Physically Abused: No    Sexually Abused: No    Family History  Problem Relation Age of Onset   Breast cancer Mother        36s & 72s   Hypertension Father    Hyperlipidemia Father    Diabetes Father    Prostate cancer Father    Heart disease Father    Sleep apnea Father    Obesity Father    Colon cancer Maternal Grandfather    Breast cancer Maternal Aunt 54   Pancreatic cancer Maternal Aunt 20   Breast cancer Maternal Aunt        later 30s-early 21s   Colon cancer Maternal Aunt 42   Colon cancer Maternal Uncle        60s   Pancreatic cancer Cousin        mother's maternal first cousin   Pancreatic cancer Cousin        mother's maternal first cousin     Current Outpatient Medications:    clindamycin  (CLEOCIN ) 150 MG capsule, Take 3 capsules (450 mg total) by mouth 3 (three) times daily for 7 days., Disp: 63 capsule, Rfl: 0   dorzolamide-timolol (COSOPT) 2-0.5 %  ophthalmic solution, Apply to eye., Disp: , Rfl:    ELIQUIS  5 MG TABS tablet, TAKE 1 TABLET BY MOUTH TWICE A DAY, Disp: 60 tablet, Rfl: 4   latanoprost (XALATAN) 0.005 % ophthalmic solution, Apply to eye., Disp: , Rfl:    Multiple Vitamin (MULTI-VITAMIN) tablet, Take 1 tablet by mouth daily., Disp: , Rfl:    Probiotic Product (PROBIOTIC-10 PO), Take by mouth., Disp: ,  Rfl:    propranolol (INDERAL) 20 MG tablet, Take 20 mg by mouth 2 (two) times daily as needed., Disp: , Rfl:    tamoxifen  (NOLVADEX ) 20 MG tablet, TAKE 1 TABLET BY MOUTH EVERY DAY, Disp: 90 tablet, Rfl: 1   Vitamin D , Ergocalciferol , (DRISDOL ) 1.25 MG (50000 UNIT) CAPS capsule, Take 1 capsule (50,000 Units total) by mouth every 7 (seven) days., Disp: 12 capsule, Rfl: 0  PHYSICAL EXAM ECOG FS:1 - Symptomatic but completely ambulatory    Vitals:   10/28/23 1500 10/28/23 1535  BP: (!) 161/111 (!) 155/97  Pulse: 93   Resp: 18   Temp: 98 F (36.7 C)   TempSrc: Oral   SpO2: 97%   Weight: 283 lb 1 oz (128.4 kg)    Physical Exam Vitals and nursing note reviewed.  Constitutional:      Appearance: She is not ill-appearing or toxic-appearing.  HENT:     Head: Normocephalic.     Mouth/Throat:     Pharynx: Uvula midline.     Comments: Patent oropharynx.  No uvular deviation.  No mass appreciated.  Tolerating secretions well.  No voice change.  No tongue swelling or soft palate induration.  Not erythematous.  No exudates appreciated.   Eyes:     Conjunctiva/sclera: Conjunctivae normal.  Neck:     Thyroid: No thyromegaly.     Comments: Left neck swelling with tenderness Cardiovascular:     Rate and Rhythm: Normal rate and regular rhythm.     Pulses: Normal pulses.          Radial pulses are 2+ on the right side and 2+ on the left side.     Heart sounds: Normal heart sounds.     Comments: Homans sign absent bilaterally, no lower extremity edema, no palpable cords, compartments are soft   Pulmonary:     Effort: Pulmonary  effort is normal.     Breath sounds: Normal breath sounds.  Abdominal:     General: There is no distension.  Musculoskeletal:     Cervical back: Normal range of motion.  Lymphadenopathy:     Head:     Left side of head: No submental or submandibular adenopathy.     Cervical: Cervical adenopathy present.     Left cervical: Superficial cervical adenopathy present.     Upper Body:     Left upper body: No supraclavicular or axillary adenopathy.  Skin:    General: Skin is warm and dry.  Neurological:     Mental Status: She is alert.        LABORATORY DATA I have reviewed the data as listed    Latest Ref Rng & Units 08/18/2023    9:14 AM 11/05/2021    9:21 AM 07/22/2021    8:11 AM  CBC  WBC 4.0 - 10.5 K/uL 5.0  5.0  6.1   Hemoglobin 12.0 - 15.0 g/dL 87.9  86.6  88.0   Hematocrit 36.0 - 46.0 % 35.9  40.0  36.0   Platelets 150 - 400 K/uL 227  266  237         Latest Ref Rng & Units 08/18/2023    9:14 AM 06/08/2023    8:30 AM 11/17/2022    9:39 AM  CMP  Glucose 70 - 99 mg/dL 98  90  94   BUN 6 - 20 mg/dL 12  9  9    Creatinine 0.44 - 1.00 mg/dL 9.36  9.27  9.28   Sodium 135 - 145 mmol/L  142  142  141   Potassium 3.5 - 5.1 mmol/L 4.1  4.3  4.3   Chloride 98 - 111 mmol/L 108  104  105   CO2 22 - 32 mmol/L 30  23  22    Calcium 8.9 - 10.3 mg/dL 9.1  9.7  9.2   Total Protein 6.5 - 8.1 g/dL 6.9  6.4  6.7   Total Bilirubin 0.0 - 1.2 mg/dL 0.3  0.3  0.3   Alkaline Phos 38 - 126 U/L 41  52  51   AST 15 - 41 U/L 12  13  16    ALT 0 - 44 U/L 17  25  20         RADIOGRAPHIC STUDIES (from last 24 hours if applicable) I have personally reviewed the radiological images as listed and agreed with the findings in the report. VAS US  UPPER EXTREMITY VENOUS DUPLEX Result Date: 10/29/2023 UPPER VENOUS STUDY  Patient Name:  DALI KRANER  Date of Exam:   10/29/2023 Medical Rec #: [id number]           Accession #:    [id number] Date of Birth: 1975-12-21          Patient Gender: F Patient Age:    55 years Exam Location:  [address] Procedure:      VAS US  UPPER EXTREMITY VENOUS DUPLEX Referring Phys: Karter Hellmer WALISIEWICZ --------------------------------------------------------------------------------  Indications: Pain, and Swelling Other Indications: 10/28/23 PA office note: Swelling and pain of left neck, shoulder, and left upper extremity with suspected lymphedema flare Persistent swelling and pain in the left neck, shoulder, and upper extremity, likely a lymphedema flare. History of lymphedema and recent increased physical activity and travel may have contributed. - Refer to lymphedema clinic for evaluation and management. - Start daily Zyrtec. - Order STAT ultrasound to rule out LUE DVT. Risk Factors: Cancer breast cancer Surgery Left breast lumpectomy and bilateral oncoplasstic breast reduction 2023. Performing Technologist: King Pierre RVT  Examination Guidelines: A complete evaluation includes B-mode imaging, spectral Doppler, color Doppler, and power Doppler as needed of all accessible portions of each vessel. Bilateral testing is considered an integral part of a complete examination. Limited examinations for reoccurring indications may be performed as noted.  Left Findings: +----------+------------+---------+-----------+----------+-------+ LEFT      CompressiblePhasicitySpontaneousPropertiesSummary +----------+------------+---------+-----------+----------+-------+ IJV           Full       Yes       Yes                      +----------+------------+---------+-----------+----------+-------+ Subclavian    Full       Yes       Yes                      +----------+------------+---------+-----------+----------+-------+ Axillary      Full       Yes       Yes                      +----------+------------+---------+-----------+----------+-------+ Brachial      Full                 Yes                       +----------+------------+---------+-----------+----------+-------+ Radial        Full                 Yes                      +----------+------------+---------+-----------+----------+-------+  Ulnar         Full                 Yes                      +----------+------------+---------+-----------+----------+-------+ Cephalic      Full                 Yes                      +----------+------------+---------+-----------+----------+-------+ Basilic       Full                 Yes                      +----------+------------+---------+-----------+----------+-------+  Findings reported to Preliminary routed to PA via Epic at 8:20 am.  Summary:  Left: No evidence of deep vein thrombosis in the upper extremity. No evidence of superficial vein thrombosis in the upper extremity.  *See table(s) above for measurements and observations.  Diagnosing physician: Fonda Rim Electronically signed by Fonda Rim on 10/29/2023 at 9:15:45 AM.    Final         Visit Diagnosis: 1. Lymphadenopathy, axillary   2. Ductal carcinoma in situ (DCIS) of left breast   3. Left arm swelling   4. Pain, dental      No orders of the defined types were placed in this encounter.   All questions were answered. The patient knows to call the clinic with any problems, questions or concerns. No barriers to learning was detected.  A total of more than 30 minutes were spent on this encounter with face-to-face time and non-face-to-face time, including preparing to see the patient, ordering tests and/or medications, counseling the patient and coordination of care as outlined above.    Thank you for allowing me to participate in the care of this patient.    Adalid Beckmann E  Walisiewicz, PA-C Department of Hematology/Oncology [facility] Phone: [phone]  [phone]    10/29/2023 9:55 AM

## 2023-10-28 NOTE — Progress Notes (Deleted)
 Symptom Management Consult Note [facility]    Patient Care Team: Vernon Velna SAUNDERS, MD as PCP - General (Internal Medicine) Loretha Ash, MD as Consulting Physician (Hematology and Oncology) Curvin Deward MOULD, MD as Consulting Physician (General Surgery) Dewey Rush, MD as Consulting Physician (Radiation Oncology) Dillingham, Estefana RAMAN, DO as Attending Physician (Plastic Surgery)    Name / MRN / DOBLarena Dalton  [id number]  1975-06-02   Date of visit: 10/28/2023   Chief Complaint/Reason for visit: left neck swelling   Current Therapy: Tamoxifen  until August 2028      ASSESSMENT AND PLAN Patient is a 48 y.o. 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 Loretha.  I have viewed most recent oncology note and lab work.  #Ductal carcinoma in situ (DCIS) of left breast  - Next appointment with oncologist is 02/22/24   #Left neck swelling     Strict ED precautions discussed should symptoms worsen.   HEME/ONC HISTORY Oncology History  Ductal carcinoma in situ (DCIS) of left breast  05/20/2021 Initial Diagnosis   Ductal carcinoma in situ (DCIS) of left breast   06/05/2021 Genetic Testing   Negative genetic testing on the CancerNext-Expanded+RNAinsight panel.  The report date is June 05, 2021.  The CancerNext-Expanded gene panel offered by [facility] and includes sequencing and rearrangement analysis for the following 77 genes: AIP, ALK, APC*, ATM*, AXIN2, BAP1, BARD1, BLM, BMPR1A, BRCA1*, BRCA2*, BRIP1*, CDC73, CDH1*, CDK4, CDKN1B, CDKN2A, CHEK2*, CTNNA1, DICER1, FANCC, FH, FLCN, GALNT12, KIF1B, LZTR1, MAX, MEN1, MET, MLH1*, MSH2*, MSH3, MSH6*, MUTYH*, NBN, NF1*, NF2, NTHL1, PALB2*, PHOX2B, PMS2*, POT1, PRKAR1A, PTCH1, PTEN*, RAD51C*, RAD51D*, RB1, RECQL, RET, SDHA, SDHAF2, SDHB, SDHC, SDHD, SMAD4, SMARCA4, SMARCB1, SMARCE1, STK11, SUFU, TMEM127, TP53*, TSC1, TSC2, VHL and XRCC2 (sequencing and  deletion/duplication); EGFR, EGLN1, HOXB13, KIT, MITF, PDGFRA, POLD1, and POLE (sequencing only); EPCAM and GREM1 (deletion/duplication only). DNA and RNA analyses performed for * genes.    06/17/2021 Surgery   Patient had left breast lumpectomy which showed high-grade ductal carcinoma in situ with necrosis and calcifications, resection margins are negative for carcinoma left additional superior margin excision again negative for carcinoma.  She had 3 out of 3 lymph nodes without involvement.  Prior prognostic showed ER 85% positive strong staining PR 95% positive strong staining. She is now undergoing plastic surgery, had right and left breast mammoplasty with no evidence of malignancy.   06/17/2021 Cancer Staging   Staging form: Breast, AJCC 8th Edition - Clinical stage from 06/17/2021: Stage 0 (cTis (DCIS), cN0, cM0, ER+, PR+) - Signed by Crawford Morna Pickle, NP on 01/30/2022   08/28/2021 - 10/16/2021 Radiation Therapy   Site Technique Total Dose (Gy) Dose per Fx (Gy) Completed Fx Beam Energies  Breast, Left: Breast_L 3D 50.4/50.4 1.8 28/28 10XFFF  Breast, Left: Breast_L_Bst 3D 10/10 2 5/5 6X, 10X     10/2021 -  Anti-estrogen oral therapy   Tamoxifen        INTERVAL HISTORY  Discussed the use of AI scribe software for clinical note transcription with the patient, who gave verbal consent to proceed.    Joan Dalton is a 48 y.o. female with oncologic history as above presenting [facility] today with chief complaint of  left neck swelling. Patient presents unaccompanied to visit today.      Last lymphademia clinic appt was on 05/28/22  ROS  All other systems are reviewed and are negative for acute change except as  noted in the HPI.    Allergies  Allergen Reactions   Sulfamethoxazole-Trimethoprim Dermatitis and Itching     Past Medical History:  Diagnosis Date   Anemia    Anxiety    Bilateral swelling of feet    Breast cancer (HCC) 05/08/2021   Constipation    Family  history of breast cancer    Family history of colon cancer    Family history of pancreatic cancer    Fatty liver    Hx of blood clots    Hypertension    Joint pain    Joint pain    Lactose intolerance    Palpitations    Personal history of radiation therapy    Vitamin D  deficiency      Past Surgical History:  Procedure Laterality Date   ABLATION     D&C Ablation of Uterus   AXILLARY SENTINEL NODE BIOPSY Left 06/17/2021   Procedure: LEFT AXILLARY SENTINEL NODE BIOPSY;  Surgeon: Curvin Deward MOULD, MD;  Location: [facility] OR;  Service: General;  Laterality: Left;   BREAST BIOPSY Left 05/08/2021   BREAST BIOPSY Right 05/07/2022   US  RT BREAST BX W LOC DEV 1ST LESION IMG BX SPEC US  GUIDE 05/07/2022 [facility] MAMMOGRAPHY   BREAST LUMPECTOMY Right 10/1998   BREAST LUMPECTOMY WITH RADIOACTIVE SEED AND SENTINEL LYMPH NODE BIOPSY Left 06/17/2021   Procedure: LEFT BREAST LUMPECTOMY WITH RADIOACTIVE SEEDS x 2;  Surgeon: Curvin Deward MOULD, MD;  Location: [facility] OR;  Service: General;  Laterality: Left;   BREAST REDUCTION SURGERY Bilateral 07/24/2021   Procedure: BILATERAL ONCOPLASTIC BREAST REDUCTION;  Surgeon: Lowery Estefana RAMAN, DO;  Location: [facility] OR;  Service: Plastics;  Laterality: Bilateral;   MASTOPEXY Bilateral 07/24/2021   Procedure: MASTOPEXY;  Surgeon: Lowery Estefana RAMAN, DO;  Location: [facility] OR;  Service: Plastics;  Laterality: Bilateral;   REDUCTION MAMMAPLASTY      Social History   Socioeconomic History   Marital status: Married    Spouse name: APPHIA CROPLEY II   Number of children: 5   Years of education: Not on file   Highest education level: Not on file  Occupational History   Not on file  Tobacco Use   Smoking status: Never   Smokeless tobacco: Never  Vaping Use   Vaping status: Never Used  Substance and Sexual Activity   Alcohol use: Never   Drug use: Never   Sexual activity: Yes  Other Topics Concern   Not on file  Social History Narrative   Not on file   Social Drivers  of Health   Financial Resource Strain: Not on file  Food Insecurity: Not on file  Transportation Needs: Not on file  Physical Activity: Not on file  Stress: Not on file  Social Connections: Not on file  Intimate Partner Violence: Not At Risk (05/21/2021)   Humiliation, Afraid, Rape, and Kick questionnaire    Fear of Current or Ex-Partner: No    Emotionally Abused: No    Physically Abused: No    Sexually Abused: No    Family History  Problem Relation Age of Onset   Breast cancer Mother        44s & 40s   Hypertension Father    Hyperlipidemia Father    Diabetes Father    Prostate cancer Father    Heart disease Father    Sleep apnea Father    Obesity Father    Colon cancer Maternal Grandfather    Breast cancer Maternal Aunt 68   Pancreatic  cancer Maternal Aunt 56   Breast cancer Maternal Aunt        later 30s-early 38s   Colon cancer Maternal Aunt 42   Colon cancer Maternal Uncle        60s   Pancreatic cancer Cousin        mother's maternal first cousin   Pancreatic cancer Cousin        mother's maternal first cousin     Current Outpatient Medications:    dorzolamide-timolol (COSOPT) 2-0.5 % ophthalmic solution, Apply to eye., Disp: , Rfl:    ELIQUIS  5 MG TABS tablet, TAKE 1 TABLET BY MOUTH TWICE A DAY, Disp: 60 tablet, Rfl: 4   latanoprost (XALATAN) 0.005 % ophthalmic solution, Apply to eye., Disp: , Rfl:    Multiple Vitamin (MULTI-VITAMIN) tablet, Take 1 tablet by mouth daily., Disp: , Rfl:    Probiotic Product (PROBIOTIC-10 PO), Take by mouth., Disp: , Rfl:    propranolol (INDERAL) 20 MG tablet, Take 20 mg by mouth 2 (two) times daily as needed., Disp: , Rfl:    tamoxifen  (NOLVADEX ) 20 MG tablet, TAKE 1 TABLET BY MOUTH EVERY DAY, Disp: 90 tablet, Rfl: 1   Vitamin D , Ergocalciferol , (DRISDOL ) 1.25 MG (50000 UNIT) CAPS capsule, Take 1 capsule (50,000 Units total) by mouth every 7 (seven) days., Disp: 12 capsule, Rfl: 0  PHYSICAL EXAM ECOG FS:{CHL ONC  ED:[id number]}   There were no vitals filed for this visit. Physical Exam     LABORATORY DATA I have reviewed the data as listed    Latest Ref Rng & Units 08/18/2023    9:14 AM 11/05/2021    9:21 AM 07/22/2021    8:11 AM  CBC  WBC 4.0 - 10.5 K/uL 5.0  5.0  6.1   Hemoglobin 12.0 - 15.0 g/dL 87.9  86.6  88.0   Hematocrit 36.0 - 46.0 % 35.9  40.0  36.0   Platelets 150 - 400 K/uL 227  266  237         Latest Ref Rng & Units 08/18/2023    9:14 AM 06/08/2023    8:30 AM 11/17/2022    9:39 AM  CMP  Glucose 70 - 99 mg/dL 98  90  94   BUN 6 - 20 mg/dL 12  9  9    Creatinine 0.44 - 1.00 mg/dL 9.36  9.27  9.28   Sodium 135 - 145 mmol/L 142  142  141   Potassium 3.5 - 5.1 mmol/L 4.1  4.3  4.3   Chloride 98 - 111 mmol/L 108  104  105   CO2 22 - 32 mmol/L 30  23  22    Calcium 8.9 - 10.3 mg/dL 9.1  9.7  9.2   Total Protein 6.5 - 8.1 g/dL 6.9  6.4  6.7   Total Bilirubin 0.0 - 1.2 mg/dL 0.3  0.3  0.3   Alkaline Phos 38 - 126 U/L 41  52  51   AST 15 - 41 U/L 12  13  16    ALT 0 - 44 U/L 17  25  20         RADIOGRAPHIC STUDIES (from last 24 hours if applicable) I have personally reviewed the radiological images as listed and agreed with the findings in the report. No results found.      Visit Diagnosis: No diagnosis found.   No orders of the defined types were placed in this encounter.   All questions were answered. The patient knows to call the clinic  with any problems, questions or concerns. No barriers to learning was detected.  A total of more than *** minutes were spent on this encounter with face-to-face time and non-face-to-face time, including preparing to see the patient, ordering tests and/or medications, counseling the patient and coordination of care as outlined above.    Thank you for allowing me to participate in the care of this patient.    Keyontay Stolz E  Walisiewicz, PA-C Department of Hematology/Oncology [facility] at [facility] Phone:  [phone]  [phone]    10/28/2023 8:59 AM

## 2023-10-28 NOTE — Telephone Encounter (Signed)
 PT referral placed.

## 2023-10-29 ENCOUNTER — Ambulatory Visit: Attending: Hematology and Oncology | Admitting: Physical Therapy

## 2023-10-29 ENCOUNTER — Ambulatory Visit (HOSPITAL_COMMUNITY)
Admission: RE | Admit: 2023-10-29 | Discharge: 2023-10-29 | Disposition: A | Discharge status: Home / Self Care | Source: Ambulatory Visit | Attending: Vascular Surgery | Admitting: Vascular Surgery

## 2023-10-29 ENCOUNTER — Other Ambulatory Visit: Payer: Self-pay

## 2023-10-29 ENCOUNTER — Encounter: Payer: Self-pay | Admitting: Physical Therapy

## 2023-10-29 DIAGNOSIS — D0512 Intraductal carcinoma in situ of left breast: Secondary | ICD-10-CM | POA: Diagnosis not present

## 2023-10-29 DIAGNOSIS — I890 Lymphedema, not elsewhere classified: Secondary | ICD-10-CM | POA: Insufficient documentation

## 2023-10-29 DIAGNOSIS — R293 Abnormal posture: Secondary | ICD-10-CM | POA: Insufficient documentation

## 2023-10-29 DIAGNOSIS — M25612 Stiffness of left shoulder, not elsewhere classified: Secondary | ICD-10-CM | POA: Diagnosis present

## 2023-10-29 DIAGNOSIS — R590 Localized enlarged lymph nodes: Secondary | ICD-10-CM | POA: Insufficient documentation

## 2023-10-29 NOTE — Therapy (Addendum)
 OUTPATIENT PHYSICAL THERAPY ONCOLOGY EVALUATION  Patient Name: Joan Dalton MRN: [id number] DOB:Jun 26, 1975, 48 y.o., female Today's Date: 10/29/2023  END OF SESSION:  PT End of Session - 10/29/23 1355     Visit Number 1    Number of Visits 9    Date for PT Re-Evaluation 11/26/23    PT Start Time 1307    PT Stop Time 1350    PT Time Calculation (min) 43 min    Activity Tolerance Patient tolerated treatment well    Behavior During Therapy [facility] for tasks assessed/performed           Past Medical History:  Diagnosis Date   Anemia    Anxiety    Bilateral swelling of feet    Breast cancer (HCC) 05/08/2021   Constipation    Family history of breast cancer    Family history of colon cancer    Family history of pancreatic cancer    Fatty liver    Hx of blood clots    Hypertension    Joint pain    Joint pain    Lactose intolerance    Palpitations    Personal history of radiation therapy    Vitamin D  deficiency    Past Surgical History:  Procedure Laterality Date   ABLATION     D&C Ablation of Uterus   AXILLARY SENTINEL NODE BIOPSY Left 06/17/2021   Procedure: LEFT AXILLARY SENTINEL NODE BIOPSY;  Surgeon: Curvin Deward MOULD, MD;  Location: [facility] OR;  Service: General;  Laterality: Left;   BREAST BIOPSY Left 05/08/2021   BREAST BIOPSY Right 05/07/2022   US  RT BREAST BX W LOC DEV 1ST LESION IMG BX SPEC US  GUIDE 05/07/2022 [facility] MAMMOGRAPHY   BREAST LUMPECTOMY Right 10/1998   BREAST LUMPECTOMY WITH RADIOACTIVE SEED AND SENTINEL LYMPH NODE BIOPSY Left 06/17/2021   Procedure: LEFT BREAST LUMPECTOMY WITH RADIOACTIVE SEEDS x 2;  Surgeon: Curvin Deward MOULD, MD;  Location[facility] OR;  Service: General;  Laterality: Left;   BREAST REDUCTION SURGERY Bilateral 07/24/2021   Procedure: BILATERAL ONCOPLASTIC BREAST REDUCTION;  Surgeon: Lowery Estefana RAMAN, DO;  Location: [facility] OR;  Service: Plastics;  Laterality: Bilateral;   MASTOPEXY Bilateral 07/24/2021   Procedure: MASTOPEXY;  Surgeon:  Lowery Estefana RAMAN, DO;  Location: [facility] OR;  Service: Plastics;  Laterality: Bilateral;   REDUCTION MAMMAPLASTY     Patient Active Problem List   Diagnosis Date Noted   Hyperlipidemia 06/08/2023   BMI 40.0-44.9, adult (HCC) 04/14/2023   Obesity, Beginning BMI 44.39 04/14/2023   Prediabetes 03/09/2023   Vitamin D  deficiency    Postoperative breast asymmetry 07/08/2021   Genetic testing 06/06/2021   Family history of breast cancer 05/24/2021   Family history of colon cancer 05/24/2021   Family history of pancreatic cancer 05/24/2021   Ductal carcinoma in situ (DCIS) of left breast 05/20/2021    PCP: Velna Skeeter, MD  REFERRING PROVIDER:  Loretha Ash, MD  REFERRING DIAG: R59.0 (ICD-10-CM) - Lymphadenopathy, axillary D05.12 (ICD-10-CM) - Ductal carcinoma in situ (DCIS) of left breast  THERAPY DIAG:  Lymphedema, not elsewhere classified - Plan: PT plan of care cert/re-cert  Stiffness of left shoulder, not elsewhere classified - Plan: PT plan of care cert/re-cert  Abnormal posture - Plan: PT plan of care cert/re-cert  ONSET DATE: 12/22/21  Rationale for Evaluation and Treatment: Rehabilitation  SUBJECTIVE:  SUBJECTIVE STATEMENT: [place] room to help move my girls from college. I have a doppler ultrasound and it was negative for DVT. I may have a dental infection. I started the antibiotics yesterday. My neck was swollen. You could see a knot. I was having pain in the side of my neck a month or two ago.   PERTINENT HISTORY: Patient was diagnosed on 05/14/21 with L breast cancer. It measures 2.8 cm and is located in the upper outer quadrant. 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 06/17/21- L breast lumpectomy and SLNB 0/3, 07/24/21- bilateral oncoplastic breast reduction, Previous  hx of  lumpectomy in 2000 on the R side for a nodule (not cancerous), hx of blood clot in 2021  PAIN:  Are you having pain? Yes 5/10, lying down makes it worse, nagging pain when lying down, tylenol  helps some  PRECAUTIONS: Other: L breast lymphedema  WEIGHT BEARING RESTRICTIONS: No  FALLS:  Has patient fallen in last 6 months? No  LIVING ENVIRONMENT: Lives with: lives with their family and lives with their spouse 5 children aged 13, 20, 10, 59, 48 Lives in: House/apartment Has following equipment at home: None  OCCUPATION:  part time- grad Arts development officer, full time Education officer, environmental in speech language pathology   LEISURE: has started back a few weeks ago doing cardio and mild strength training  HAND DOMINANCE: right   PRIOR LEVEL OF FUNCTION: Independent  PATIENT GOALS: to reduce the pain and swelling   OBJECTIVE:  COGNITION: Overall cognitive status: Within functional limits for tasks assessed   PALPATION: Thickened/fibrotic edema in L breast  OBSERVATIONS / OTHER ASSESSMENTS: peau d orange skin of L breast, L breast larger than R  POSTURE:  Forward head and rounded shoulders posture   UPPER EXTREMITY AROM/PROM:   A/PROM RIGHT  eval    Shoulder extension 69  Shoulder flexion 133  Shoulder abduction 115  Shoulder internal rotation 58  Shoulder external rotation 81                          (Blank rows = not tested)   A/PROM LEFT  eval  Shoulder extension 58  Shoulder flexion 132  Shoulder abduction 105  Shoulder internal rotation 45  Shoulder external rotation 79                          (Blank rows = not tested       UPPER EXTREMITY STRENGTH: 5/5     LYMPHEDEMA ASSESSMENTS:    LANDMARK RIGHT  eval  10 cm proximal to olecranon process 39  Olecranon process 31.1  10 cm proximal to ulnar styloid process 26.5  Just proximal to ulnar styloid process 19.9  Across hand at thumb web space 23  At base of 2nd digit 7.9  (Blank rows = not tested)    LANDMARK LEFT   eval  10 cm proximal to olecranon process 39.9  Olecranon process 31.5  10 cm proximal to ulnar styloid process 27.6  Just proximal to ulnar styloid process 21  Across hand at thumb web space 21.3  At base of 2nd digit 7.6  (Blank rows = not tested)  LYMPHEDEMA ASSESSMENTS:   SURGERY TYPE/DATE: 06/17/21- L br lumpectomy and SLNB; 07/24/21 - bilateral oncoplastic breast reduction; hx of R lumpectomy in 2000 - non cancerous  NUMBER OF LYMPH NODES REMOVED: 0/3  CHEMOTHERAPY: none  RADIATION:complted  on 10/16/21  HORMONE TREATMENT: began 10/2021- on Tamoxifen   INFECTIONS: none   QUICK DASH:   Quick Dash - 10/29/23 0001     Open a tight or new jar No difficulty    Do heavy household chores (wash walls, wash floors) Mild difficulty    Carry a shopping bag or briefcase No difficulty    Wash your back Mild difficulty    Use a knife to cut food No difficulty    Recreational activities in which you take some force or impact through your arm, shoulder, or hand (golf, hammering, tennis) Mild difficulty    During the past week, to what extent has your arm, shoulder or hand problem interfered with your normal social activities with family, friends, neighbors, or groups? Modererately    During the past week, to what extent has your arm, shoulder or hand problem limited your work or other regular daily activities Not at all    Arm, shoulder, or hand pain. Moderate    Tingling (pins and needles) in your arm, shoulder, or hand Mild    Difficulty Sleeping So much difficuSo much difficulty, I can't sleep    DASH Score 27.27 %            PATIENT EDUCATION:  Education details: typical vs atypical presentation of lymphedema, need to rule out dental infection Person educated: Patient Education method: Explanation Education comprehension: verbalized understanding  HOME EXERCISE PROGRAM: None yet  ASSESSMENT:  CLINICAL IMPRESSION: Patient is a 48 y.o. female who was seen  today for physical therapy evaluation and treatment for L neck, supraclavicular swelling and L arm swelling as well as L neck, supraclavicular and upper arm pain. Pt had some dental issues and has not returned to the dentist so discussed possibility of dental infection. She has been started on antibiotics. She has been having eye issues on the L side as well and is being seen by her eye doctor for glaucoma but she reports he was also not ruling out a tumor mimicking glaucoma. Sent an in basket message to pt's oncologist stating this is this presentation is not typical of lymphedema. Will place pt on hold for now until she is cleared of dental infection or other causes of swelling and once cleared she would benefit from skilled PT services to improve L shoulder ROM, decrease pain and decrease swelling.     OBJECTIVE IMPAIRMENTS: decreased knowledge of condition, decreased knowledge of use of DME, decreased ROM, decreased strength, increased edema, increased fascial restrictions, impaired UE functional use, postural dysfunction, and pain.   ACTIVITY LIMITATIONS: carrying, lifting, sleeping, bed mobility, and reach over head  PARTICIPATION LIMITATIONS: none  PERSONAL FACTORS: None are also affecting patient's functional outcome.   REHAB POTENTIAL: Good  CLINICAL DECISION MAKING: Stable/uncomplicated  EVALUATION COMPLEXITY: Low  GOALS: Goals reviewed with patient? Yes  SHORT TERM GOALS=LONG TERM GOALS Target date: 11/26/23  Pt will be independent in self MLD for long term management of lymphedema.  Baseline: Goal status: INITIAL  2.  Pt will demonstrate 150 degrees of L shoulder abudction to allow her to reach out to the side.  Baseline: 105 Goal status: INITIAL  3.  Pt will be independent in a home exericse program for continued stretching and strengthening.  Baseline:  Goal status: INITIAL   PLAN:  PT FREQUENCY: 2x/week  PT DURATION: 4 weeks  PLANNED INTERVENTIONS: [id number]- PT  Re-evaluation, 97110-Therapeutic exercises, 97530- Therapeutic activity, 97535- Self Care, [id number]- Manual therapy, [id number]- Orthotic Initial, [id number]- Orthotic/Prosthetic subsequent, [id number]-  Vasopneumatic device, Patient/Family education, Taping, Joint mobilization, Manual lymph drainage, Scar mobilization, and Compression bandaging  PLAN FOR NEXT SESSION: what did doctor say? Is cleared begin LUE lymphedema and L upper quarter lymphedema, ROM to L shoulder  Florina Sever Blue, PT 10/29/2023, 2:01 PM

## 2023-11-05 ENCOUNTER — Inpatient Hospital Stay: Admitting: Physician Assistant

## 2023-11-06 ENCOUNTER — Inpatient Hospital Stay: Admitting: Hematology and Oncology

## 2023-11-06 VITALS — BP 126/84 | HR 91 | Temp 98.3°F | Ht 66.0 in | Wt 285.0 lb

## 2023-11-06 DIAGNOSIS — R221 Localized swelling, mass and lump, neck: Secondary | ICD-10-CM

## 2023-11-06 DIAGNOSIS — D0511 Intraductal carcinoma in situ of right breast: Secondary | ICD-10-CM | POA: Diagnosis not present

## 2023-11-06 MED ORDER — BACLOFEN 5 MG PO TABS: 5.0000 mg | ORAL_TABLET | Freq: Every evening | ORAL | 0 refills | Status: DC | PRN

## 2023-11-06 NOTE — Progress Notes (Signed)
 [facility] CONSULT NOTE  Patient Care Team: Vernon Velna SAUNDERS, MD as PCP - General (Internal Medicine) Loretha Ash, MD as Consulting Physician (Hematology and Oncology) Curvin Deward MOULD, MD as Consulting Physician (General Surgery) Dewey Rush, MD as Consulting Physician (Radiation Oncology) Dillingham, Estefana RAMAN, DO as Attending Physician (Plastic Surgery)  CHIEF COMPLAINTS/PURPOSE OF CONSULTATION:  Breast cancer  HISTORY OF PRESENTING ILLNESS:  Joan Dalton 48 y.o. female is here because of recent diagnosis of left breast cancer  I reviewed her records extensively and collaborated the history with the patient.  SUMMARY OF ONCOLOGIC HISTORY: Oncology History  Ductal carcinoma in situ (DCIS) of left breast  05/20/2021 Initial Diagnosis   Ductal carcinoma in situ (DCIS) of left breast   06/05/2021 Genetic Testing   Negative genetic testing on the CancerNext-Expanded+RNAinsight panel.  The report date is June 05, 2021.  The CancerNext-Expanded gene panel offered by [facility] and includes sequencing and rearrangement analysis for the following 77 genes: AIP, ALK, APC*, ATM*, AXIN2, BAP1, BARD1, BLM, BMPR1A, BRCA1*, BRCA2*, BRIP1*, CDC73, CDH1*, CDK4, CDKN1B, CDKN2A, CHEK2*, CTNNA1, DICER1, FANCC, FH, FLCN, GALNT12, KIF1B, LZTR1, MAX, MEN1, MET, MLH1*, MSH2*, MSH3, MSH6*, MUTYH*, NBN, NF1*, NF2, NTHL1, PALB2*, PHOX2B, PMS2*, POT1, PRKAR1A, PTCH1, PTEN*, RAD51C*, RAD51D*, RB1, RECQL, RET, SDHA, SDHAF2, SDHB, SDHC, SDHD, SMAD4, SMARCA4, SMARCB1, SMARCE1, STK11, SUFU, TMEM127, TP53*, TSC1, TSC2, VHL and XRCC2 (sequencing and deletion/duplication); EGFR, EGLN1, HOXB13, KIT, MITF, PDGFRA, POLD1, and POLE (sequencing only); EPCAM and GREM1 (deletion/duplication only). DNA and RNA analyses performed for * genes.    06/17/2021 Surgery   Patient had left breast lumpectomy which showed high-grade ductal carcinoma in situ with necrosis and calcifications, resection margins are  negative for carcinoma left additional superior margin excision again negative for carcinoma.  She had 3 out of 3 lymph nodes without involvement.  Prior prognostic showed ER 85% positive strong staining PR 95% positive strong staining. She is now undergoing plastic surgery, had right and left breast mammoplasty with no evidence of malignancy.   06/17/2021 Cancer Staging   Staging form: Breast, AJCC 8th Edition - Clinical stage from 06/17/2021: Stage 0 (cTis (DCIS), cN0, cM0, ER+, PR+) - Signed by Crawford Morna Pickle, NP on 01/30/2022   08/28/2021 - 10/16/2021 Radiation Therapy   Site Technique Total Dose (Gy) Dose per Fx (Gy) Completed Fx Beam Energies  Breast, Left: Breast_L 3D 50.4/50.4 1.8 28/28 10XFFF  Breast, Left: Breast_L_Bst 3D 10/10 2 5/5 6X, 10X     10/2021 -  Anti-estrogen oral therapy   Tamoxifen      History of Present Illness         The patient, with a history of breast cancer and blood clots, presents with concerns about prediabetes, potential glaucoma, and weight management.   Discussed the use of AI scribe software for clinical note transcription with the patient, who gave verbal consent to proceed.  History of Present Illness  Joan Dalton is a 48 year old female with a history of breast cancer who presents with neck swelling and discomfort.  She has been experiencing neck swelling and discomfort for approximately two weeks, which began after sleeping in an awkward position on a couch converter while traveling. Initially, she attributed the stiffness to this sleeping position, but upon returning to [place], she noticed increased swelling and a sensation akin to a pinched nerve extending from her shoulder area to under her arm. The discomfort has been significant enough to affect her sleep.  She has been using Tylenol ,  Icy Hot, Biofreeze, and Tylenol  pain cream to manage the discomfort. She underwent an ultrasound and was evaluated at a DVT clinic to rule out a  blood clot, which was negative. She is currently taking Eliquis . She was also prescribed clindamycin , which she believes helped with some pain in her Achilles area, though she is unsure if it addressed the swelling.  She was referred to a lymphedema clinic, where she was evaluated by a physical therapist. She also consulted a dentist regarding a potential dental infection, but the dentist did not find any issues related to her dental work that could explain the swelling.  She has a history of breast cancer, specifically DCIS, and is currently taking tamoxifen  once a day. 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 No ear or sinus issues or dizziness associated with the swelling.  Socially, she has five children, with the youngest being fourteen years old. She recently took her twin daughters to [place]  to start school.   Rest of the pertinent 10 point ROS reviewed and negative  MEDICAL HISTORY:  Past Medical History:  Diagnosis Date   Anemia    Anxiety    Bilateral swelling of feet    Breast cancer (HCC) 05/08/2021   Constipation    Family history of breast cancer    Family history of colon cancer    Family history of pancreatic cancer    Fatty liver    Hx of blood clots    Hypertension    Joint pain    Joint pain    Lactose intolerance    Palpitations    Personal history of radiation therapy    Vitamin D  deficiency     SURGICAL HISTORY: Past Surgical History:  Procedure Laterality Date   ABLATION     D&C Ablation of Uterus   AXILLARY SENTINEL NODE BIOPSY Left 06/17/2021   Procedure: LEFT AXILLARY SENTINEL NODE BIOPSY;  Surgeon: Curvin Deward MOULD, MD;  Location: [facility] OR;  Service: General;  Laterality: Left;   BREAST BIOPSY Left 05/08/2021   BREAST BIOPSY Right 05/07/2022   US  RT BREAST BX W LOC DEV 1ST LESION IMG BX SPEC US  GUIDE 05/07/2022 [facility] MAMMOGRAPHY   BREAST LUMPECTOMY  Right 10/1998   BREAST LUMPECTOMY WITH RADIOACTIVE SEED AND SENTINEL LYMPH NODE BIOPSY Left 06/17/2021   Procedure: LEFT BREAST LUMPECTOMY WITH RADIOACTIVE SEEDS x 2;  Surgeon: Curvin Deward MOULD, MD;  Location: [facility] OR;  Service: General;  Laterality: Left;   BREAST REDUCTION SURGERY Bilateral 07/24/2021   Procedure: BILATERAL ONCOPLASTIC BREAST REDUCTION;  Surgeon: Lowery Estefana RAMAN, DO;  Location: [facility] OR;  Service: Plastics;  Laterality: Bilateral;   MASTOPEXY Bilateral 07/24/2021   Procedure: MASTOPEXY;  Surgeon: Lowery Estefana RAMAN, DO;  Location: [facility] OR;  Service: Plastics;  Laterality: Bilateral;   REDUCTION MAMMAPLASTY      SOCIAL HISTORY: Social History   Socioeconomic History   Marital status: Married    Spouse name: DEAUN ROCHA II   Number of children: 5   Years of education: Not on file   Highest education level: Not on file  Occupational History   Not on file  Tobacco Use   Smoking status: Never   Smokeless tobacco: Never  Vaping Use   Vaping status: Never Used  Substance and Sexual Activity   Alcohol use: Never   Drug use: Never   Sexual activity: Yes  Other Topics  Concern   Not on file  Social History Narrative   Not on file   Social Drivers of Health   Financial Resource Strain: Not on file  Food Insecurity: Not on file  Transportation Needs: Not on file  Physical Activity: Not on file  Stress: Not on file  Social Connections: Not on file  Intimate Partner Violence: Not At Risk (05/21/2021)   Humiliation, Afraid, Rape, and Kick questionnaire    Fear of Current or Ex-Partner: No    Emotionally Abused: No    Physically Abused: No    Sexually Abused: No    FAMILY HISTORY: Family History  Problem Relation Age of Onset   Breast cancer Mother        27s & 48s   Hypertension Father    Hyperlipidemia Father    Diabetes Father    Prostate cancer Father    Heart disease Father    Sleep apnea Father    Obesity Father    Colon cancer Maternal  Grandfather    Breast cancer Maternal Aunt 75   Pancreatic cancer Maternal Aunt 16   Breast cancer Maternal Aunt        later 30s-early 51s   Colon cancer Maternal Aunt 42   Colon cancer Maternal Uncle        60s   Pancreatic cancer Cousin        mother's maternal first cousin   Pancreatic cancer Cousin        mother's maternal first cousin    ALLERGIES:  is allergic to sulfamethoxazole-trimethoprim.  MEDICATIONS:  Current Outpatient Medications  Medication Sig Dispense Refill   dorzolamide-timolol (COSOPT) 2-0.5 % ophthalmic solution Apply to eye.     ELIQUIS  5 MG TABS tablet TAKE 1 TABLET BY MOUTH TWICE A DAY 60 tablet 4   latanoprost (XALATAN) 0.005 % ophthalmic solution Apply to eye.     Multiple Vitamin (MULTI-VITAMIN) tablet Take 1 tablet by mouth daily.     Probiotic Product (PROBIOTIC-10 PO) Take by mouth.     propranolol (INDERAL) 20 MG tablet Take 20 mg by mouth 2 (two) times daily as needed.     tamoxifen  (NOLVADEX ) 20 MG tablet TAKE 1 TABLET BY MOUTH EVERY DAY 90 tablet 1   Vitamin D , Ergocalciferol , (DRISDOL ) 1.25 MG (50000 UNIT) CAPS capsule Take 1 capsule (50,000 Units total) by mouth every 7 (seven) days. 12 capsule 0   No current facility-administered medications for this visit.     PHYSICAL EXAMINATION: ECOG PERFORMANCE STATUS: 0 - Asymptomatic  Vitals:   11/06/23 1000  BP: 126/84  Pulse: 91  Temp: 98.3 F (36.8 C)  SpO2: 99%     Filed Weights   11/06/23 1000  Weight: 285 lb (129.3 kg)     Physical Exam Constitutional:      Appearance: Normal appearance.  Chest:     Comments: Bilateral breasts inspected and palpated. Left breast with post radiation changes. No palpable masses. Mild LUE swelling noted. No regional adenopathy Neurological:     Mental Status: She is alert.      LABORATORY DATA:  I have reviewed the data as listed Lab Results  Component Value Date   WBC 5.0 08/18/2023   HGB 12.0 08/18/2023   HCT 35.9 (L) 08/18/2023    MCV 93.0 08/18/2023   PLT 227 08/18/2023   Lab Results  Component Value Date   NA 142 08/18/2023   K 4.1 08/18/2023   CL 108 08/18/2023   CO2 30 08/18/2023  RADIOGRAPHIC STUDIES: I have personally reviewed the radiological reports and agreed with the findings in the report.  ASSESSMENT AND PLAN:   This is a very pleasant 48 year old female patient (menopausal status pre menopausal, last period Several years ago but had endometrial ablation) referred to hematology/oncology for DCIS of the right breast and recommendations regarding the above.  She has now completed adjuvant radiation and is here on tamoxifen . Her annual diagnostic mammogram is on Apr 16, 2023.  Assessment and Plan Assessment & Plan  Neck and shoulder swelling and pain, possible muscle spasm Swelling and pain in the neck and shoulder for two weeks. Ultrasound ruled out DVT. Differential includes muscle spasm, parotid gland involvement, or mastoiditis. Clindamycin  may have alleviated other symptoms but not swelling. Muscle spasm considered possible. - Order MRI of the face focusing on the mastoid. - Prescribe baclofen  5 mg at night for muscle relaxation. - Consult radiologist for optimal imaging strategy. - Order imaging post-radiologist consultation. - Send prescription to [facility].  History of ductal carcinoma in situ (DCIS) of breast DCIS was non-invasive and treated. Current symptoms not related to previous breast cancer. No palpable lymph nodes or breast abnormalities.  Follow-up in 6 months.  Total time spent 30 minutes including review of history, records, counseling about antiestrogen therapy options, coordination of care  Thank you for consulting us  in the care of this patient.  Please not hesitate to contact us  with any additional questions or concerns.

## 2023-11-06 NOTE — Progress Notes (Deleted)
 [facility] CONSULT NOTE  Patient Care Team: Vernon Velna SAUNDERS, MD as PCP - General (Internal Medicine) Loretha Ash, MD as Consulting Physician (Hematology and Oncology) Curvin Deward MOULD, MD as Consulting Physician (General Surgery) Dewey Rush, MD as Consulting Physician (Radiation Oncology) Dillingham, Estefana RAMAN, DO as Attending Physician (Plastic Surgery)  CHIEF COMPLAINTS/PURPOSE OF CONSULTATION:  Breast cancer  HISTORY OF PRESENTING ILLNESS:  Joan Dalton 48 y.o. female is here because of recent diagnosis of left breast cancer  I reviewed her records extensively and collaborated the history with the patient.  SUMMARY OF ONCOLOGIC HISTORY: Oncology History  Ductal carcinoma in situ (DCIS) of left breast  05/20/2021 Initial Diagnosis   Ductal carcinoma in situ (DCIS) of left breast   06/05/2021 Genetic Testing   Negative genetic testing on the CancerNext-Expanded+RNAinsight panel.  The report date is June 05, 2021.  The CancerNext-Expanded gene panel offered by [facility] and includes sequencing and rearrangement analysis for the following 77 genes: AIP, ALK, APC*, ATM*, AXIN2, BAP1, BARD1, BLM, BMPR1A, BRCA1*, BRCA2*, BRIP1*, CDC73, CDH1*, CDK4, CDKN1B, CDKN2A, CHEK2*, CTNNA1, DICER1, FANCC, FH, FLCN, GALNT12, KIF1B, LZTR1, MAX, MEN1, MET, MLH1*, MSH2*, MSH3, MSH6*, MUTYH*, NBN, NF1*, NF2, NTHL1, PALB2*, PHOX2B, PMS2*, POT1, PRKAR1A, PTCH1, PTEN*, RAD51C*, RAD51D*, RB1, RECQL, RET, SDHA, SDHAF2, SDHB, SDHC, SDHD, SMAD4, SMARCA4, SMARCB1, SMARCE1, STK11, SUFU, TMEM127, TP53*, TSC1, TSC2, VHL and XRCC2 (sequencing and deletion/duplication); EGFR, EGLN1, HOXB13, KIT, MITF, PDGFRA, POLD1, and POLE (sequencing only); EPCAM and GREM1 (deletion/duplication only). DNA and RNA analyses performed for * genes.    06/17/2021 Surgery   Patient had left breast lumpectomy which showed high-grade ductal carcinoma in situ with necrosis and calcifications, resection margins are  negative for carcinoma left additional superior margin excision again negative for carcinoma.  She had 3 out of 3 lymph nodes without involvement.  Prior prognostic showed ER 85% positive strong staining PR 95% positive strong staining. She is now undergoing plastic surgery, had right and left breast mammoplasty with no evidence of malignancy.   06/17/2021 Cancer Staging   Staging form: Breast, AJCC 8th Edition - Clinical stage from 06/17/2021: Stage 0 (cTis (DCIS), cN0, cM0, ER+, PR+) - Signed by Crawford Morna Pickle, NP on 01/30/2022   08/28/2021 - 10/16/2021 Radiation Therapy   Site Technique Total Dose (Gy) Dose per Fx (Gy) Completed Fx Beam Energies  Breast, Left: Breast_L 3D 50.4/50.4 1.8 28/28 10XFFF  Breast, Left: Breast_L_Bst 3D 10/10 2 5/5 6X, 10X     10/2021 -  Anti-estrogen oral therapy   Tamoxifen      History of Present Illness         The patient, with a history of breast cancer and blood clots, presents with concerns about prediabetes, potential glaucoma, and weight management.   Discussed the use of AI scribe software for clinical note transcription with the patient, who gave verbal consent to proceed.  History of Present Illness Joan Dalton is a 48 year old female with breast cancer while on tamoxifen  who presents for follow-up regarding left neck swelling.   Rest of the pertinent 10 point ROS reviewed and negative  MEDICAL HISTORY:  Past Medical History:  Diagnosis Date   Anemia    Anxiety    Bilateral swelling of feet    Breast cancer (HCC) 05/08/2021   Constipation    Family history of breast cancer    Family history of colon cancer    Family history of pancreatic cancer    Fatty liver  Hx of blood clots    Hypertension    Joint pain    Joint pain    Lactose intolerance    Palpitations    Personal history of radiation therapy    Vitamin D  deficiency     SURGICAL HISTORY: Past Surgical History:  Procedure Laterality Date   ABLATION      D&C Ablation of Uterus   AXILLARY SENTINEL NODE BIOPSY Left 06/17/2021   Procedure: LEFT AXILLARY SENTINEL NODE BIOPSY;  Surgeon: Curvin Deward MOULD, MD;  Location: [facility] OR;  Service: General;  Laterality: Left;   BREAST BIOPSY Left 05/08/2021   BREAST BIOPSY Right 05/07/2022   US  RT BREAST BX W LOC DEV 1ST LESION IMG BX SPEC US  GUIDE 05/07/2022 [facility] MAMMOGRAPHY   BREAST LUMPECTOMY Right 10/1998   BREAST LUMPECTOMY WITH RADIOACTIVE SEED AND SENTINEL LYMPH NODE BIOPSY Left 06/17/2021   Procedure: LEFT BREAST LUMPECTOMY WITH RADIOACTIVE SEEDS x 2;  Surgeon: Curvin Deward MOULD, MD;  Location: [facility] OR;  Service: General;  Laterality: Left;   BREAST REDUCTION SURGERY Bilateral 07/24/2021   Procedure: BILATERAL ONCOPLASTIC BREAST REDUCTION;  Surgeon: Lowery Estefana RAMAN, DO;  Location: [facility] OR;  Service: Plastics;  Laterality: Bilateral;   MASTOPEXY Bilateral 07/24/2021   Procedure: MASTOPEXY;  Surgeon: Lowery Estefana RAMAN, DO;  Location: [facility] OR;  Service: Plastics;  Laterality: Bilateral;   REDUCTION MAMMAPLASTY      SOCIAL HISTORY: Social History   Socioeconomic History   Marital status: Married    Spouse name: MESCAL FLINCHBAUGH II   Number of children: 5   Years of education: Not on file   Highest education level: Not on file  Occupational History   Not on file  Tobacco Use   Smoking status: Never   Smokeless tobacco: Never  Vaping Use   Vaping status: Never Used  Substance and Sexual Activity   Alcohol use: Never   Drug use: Never   Sexual activity: Yes  Other Topics Concern   Not on file  Social History Narrative   Not on file   Social Drivers of Health   Financial Resource Strain: Not on file  Food Insecurity: Not on file  Transportation Needs: Not on file  Physical Activity: Not on file  Stress: Not on file  Social Connections: Not on file  Intimate Partner Violence: Not At Risk (05/21/2021)   Humiliation, Afraid, Rape, and Kick questionnaire    Fear of Current or Ex-Partner: No     Emotionally Abused: No    Physically Abused: No    Sexually Abused: No    FAMILY HISTORY: Family History  Problem Relation Age of Onset   Breast cancer Mother        60s & 23s   Hypertension Father    Hyperlipidemia Father    Diabetes Father    Prostate cancer Father    Heart disease Father    Sleep apnea Father    Obesity Father    Colon cancer Maternal Grandfather    Breast cancer Maternal Aunt 1   Pancreatic cancer Maternal Aunt 54   Breast cancer Maternal Aunt        later 30s-early 74s   Colon cancer Maternal Aunt 42   Colon cancer Maternal Uncle        60s   Pancreatic cancer Cousin        mother's maternal first cousin   Pancreatic cancer Cousin        mother's maternal first cousin    ALLERGIES:  is allergic to sulfamethoxazole-trimethoprim.  MEDICATIONS:  Current Outpatient Medications  Medication Sig Dispense Refill   dorzolamide-timolol (COSOPT) 2-0.5 % ophthalmic solution Apply to eye.     ELIQUIS  5 MG TABS tablet TAKE 1 TABLET BY MOUTH TWICE A DAY 60 tablet 4   latanoprost (XALATAN) 0.005 % ophthalmic solution Apply to eye.     Multiple Vitamin (MULTI-VITAMIN) tablet Take 1 tablet by mouth daily.     Probiotic Product (PROBIOTIC-10 PO) Take by mouth.     propranolol (INDERAL) 20 MG tablet Take 20 mg by mouth 2 (two) times daily as needed.     tamoxifen  (NOLVADEX ) 20 MG tablet TAKE 1 TABLET BY MOUTH EVERY DAY 90 tablet 1   Vitamin D , Ergocalciferol , (DRISDOL ) 1.25 MG (50000 UNIT) CAPS capsule Take 1 capsule (50,000 Units total) by mouth every 7 (seven) days. 12 capsule 0   No current facility-administered medications for this visit.     PHYSICAL EXAMINATION: ECOG PERFORMANCE STATUS: 0 - Asymptomatic  Vitals:   11/06/23 1000  BP: 126/84  Pulse: 91  Temp: 98.3 F (36.8 C)  SpO2: 99%     Filed Weights   11/06/23 1000  Weight: 285 lb (129.3 kg)     Physical Exam Constitutional:      Appearance: Normal appearance.  Chest:      Comments: Bilateral breasts inspected and palpated. Left breast with post radiation changes. No palpable masses. Mild LUE swelling noted. No regional adenopathy Neurological:     Mental Status: She is alert.      LABORATORY DATA:  I have reviewed the data as listed Lab Results  Component Value Date   WBC 5.0 08/18/2023   HGB 12.0 08/18/2023   HCT 35.9 (L) 08/18/2023   MCV 93.0 08/18/2023   PLT 227 08/18/2023   Lab Results  Component Value Date   NA 142 08/18/2023   K 4.1 08/18/2023   CL 108 08/18/2023   CO2 30 08/18/2023    RADIOGRAPHIC STUDIES: I have personally reviewed the radiological reports and agreed with the findings in the report.  ASSESSMENT AND PLAN:   This is a very pleasant 48 year old female patient (menopausal status pre menopausal, last period Several years ago but had endometrial ablation) referred to hematology/oncology for DCIS of the right breast and recommendations regarding the above.  She has now completed adjuvant radiation and is here on tamoxifen . Her annual diagnostic mammogram is on Apr 16, 2023.  Assessment and Plan Assessment & Plan Breast cancer, post-treatment - Continue tamoxifen  until August 2028. - Order mammogram for January 2026. - Schedule follow-up in six months unless new symptoms arise.  Lymphedema of left arm Mild swelling likely due to post-treatment lymphedema. Lack of recent exercises or massages may have contributed. - Recommend resuming physical therapy exercises and massages for lymphedema management.  Weight gain Weight gain since starting tamoxifen , current weight 266 lbs. Dietary modifications effective when adhered to. Emphasized importance of dietary modifications and hydration. - Encourage consistent dietary modifications and increased water intake.  Follow-up in 6 months.  Total time spent 30 minutes including review of history, records, counseling about antiestrogen therapy options, coordination of care   Thank you for consulting us  in the care of this patient.  Please not hesitate to contact us  with any additional questions or concerns.

## 2023-11-10 ENCOUNTER — Encounter (INDEPENDENT_AMBULATORY_CARE_PROVIDER_SITE_OTHER): Payer: Self-pay | Admitting: Family Medicine

## 2023-11-10 ENCOUNTER — Ambulatory Visit (INDEPENDENT_AMBULATORY_CARE_PROVIDER_SITE_OTHER): Admitting: Family Medicine

## 2023-11-10 VITALS — BP 150/102 | HR 109 | Temp 98.1°F | Ht 66.0 in | Wt 278.0 lb

## 2023-11-10 DIAGNOSIS — E669 Obesity, unspecified: Secondary | ICD-10-CM

## 2023-11-10 DIAGNOSIS — R7303 Prediabetes: Secondary | ICD-10-CM

## 2023-11-10 DIAGNOSIS — E559 Vitamin D deficiency, unspecified: Secondary | ICD-10-CM | POA: Diagnosis not present

## 2023-11-10 DIAGNOSIS — E7849 Other hyperlipidemia: Secondary | ICD-10-CM

## 2023-11-10 DIAGNOSIS — Z6841 Body Mass Index (BMI) 40.0 and over, adult: Secondary | ICD-10-CM

## 2023-11-10 DIAGNOSIS — R0602 Shortness of breath: Secondary | ICD-10-CM

## 2023-11-10 NOTE — Assessment & Plan Note (Addendum)
 RMR on 11/05/21 of 1109.  She reports that her symptoms are slightly worse than they were previously.  Indirect calorimetry done today and RMR of 1123.  Will continue current meal plan with more consistent adherence.

## 2023-11-10 NOTE — Progress Notes (Signed)
 SUBJECTIVE:  Chief Complaint: Obesity  Interim History: Patient was traveling to [place]  bringing her daughters to school.  She also had to go to the dentist while away due to what she thought was an infection in her mouth that she feels was contributing to swelling on the left side of her neck.  She started getting back more consistently in the gym prior to her traveling. She is feeling tired overall.  She may be interested in weight loss medications but wants a safe alternative.  Joan Dalton is here to discuss her progress with her obesity treatment plan. She is on the [employer] and states she is following her eating plan approximately 20 % of the time. She states she is exercising 60 minutes 3 times per week.   OBJECTIVE: Visit Diagnoses: Problem List Items Addressed This Visit   None   Vitals Temp: 98.1 F (36.7 C) BP: (!) 150/102 Pulse Rate: (!) 109 SpO2: 97 %   Anthropometric Measurements Height: 5' 6 (1.676 m) Weight: 278 lb (126.1 kg) BMI (Calculated): 44.89 Weight at Last Visit: 267 lb Weight Lost Since Last Visit: 0 Weight Gained Since Last Visit: 11 Starting Weight: 275 lb   Body Composition  Body Fat %: 53.8 % Fat Mass (lbs): 149.6 lbs Muscle Mass (lbs): 122 lbs Visceral Fat Rating : 17   Other Clinical Data Fasting: yes Labs: yes Today's Visit #: 19 Starting Date: 11/05/21 Comments: Pesc     ASSESSMENT AND PLAN: Assessment & Plan SOBOE (shortness of breath on exertion) RMR on 11/05/21 of 1109.  She reports that her symptoms are slightly worse than they were previously.  Indirect calorimetry done today and RMR of 1123.  Will continue current meal plan with more consistent adherence. Other hyperlipidemia LDL in March of this year at 123 with a total cholesterol of 207.  HDL and triglycerides at goal on previous lab.  Repeat fasting lipid panel today to assess response to lifestyle changes.  Will need to risk stratify with new results at next  appointment. Prediabetes Previous hemoglobin A1c better controlled at 5.6 with a minimally elevated insulin  level.  Patient has been working on lifestyle changes.  Will repeat labs today and discuss results at next appointment. Vitamin D  deficiency Previous vitamin D  level below goal.  Patient has been on supplementation since that time.  Repeat vitamin D  level today. Obesity, Beginning BMI 44.39  BMI 40.0-44.9, adult (HCC)    Diet: Joan Dalton is currently in the action stage of change. As such, her goal is to continue with weight loss efforts and has agreed to the [employer].   Exercise:  For substantial health benefits, adults should do at least 150 minutes (2 hours and 30 minutes) a week of moderate-intensity, or 75 minutes (1 hour and 15 minutes) a week of vigorous-intensity aerobic physical activity, or an equivalent combination of moderate- and vigorous-intensity aerobic activity. Aerobic activity should be performed in episodes of at least 10 minutes, and preferably, it should be spread throughout the week.  Behavior Modification:  We discussed the following Behavioral Modification Strategies today: increasing lean protein intake, decreasing simple carbohydrates, increasing vegetables, meal planning and cooking strategies, and keeping healthy foods in the home.   No follow-ups on file.   She was informed of the importance of frequent follow up visits to maximize her success with intensive lifestyle modifications for her multiple health conditions.  Attestation Statements:   Reviewed by clinician on day of visit: allergies, medications, problem list, medical history, surgical  history, family history, social history, and previous encounter notes.     Joan Cho, MD

## 2023-11-12 LAB — COMPREHENSIVE METABOLIC PANEL WITH GFR
ALT: 21 IU/L (ref 0–32)
AST: 17 IU/L (ref 0–40)
Albumin: 4.2 g/dL (ref 3.9–4.9)
Alkaline Phosphatase: 52 IU/L (ref 44–121)
BUN/Creatinine Ratio: 14 (ref 9–23)
BUN: 10 mg/dL (ref 6–24)
Bilirubin Total: 0.4 mg/dL (ref 0.0–1.2)
CO2: 23 mmol/L (ref 20–29)
Calcium: 9.4 mg/dL (ref 8.7–10.2)
Chloride: 104 mmol/L (ref 96–106)
Creatinine, Ser: 0.74 mg/dL (ref 0.57–1.00)
Globulin, Total: 2.6 g/dL (ref 1.5–4.5)
Glucose: 102 mg/dL — ABNORMAL HIGH (ref 70–99)
Potassium: 4.4 mmol/L (ref 3.5–5.2)
Sodium: 141 mmol/L (ref 134–144)
Total Protein: 6.8 g/dL (ref 6.0–8.5)
eGFR: 99 mL/min/1.73 (ref >59)

## 2023-11-12 LAB — LIPID PANEL WITH LDL/HDL RATIO
Cholesterol, Total: 197 mg/dL (ref 100–199)
HDL: 63 mg/dL (ref >39)
LDL Chol Calc (NIH): 117 mg/dL — ABNORMAL HIGH (ref 0–99)
LDL/HDL Ratio: 1.9 ratio (ref 0.0–3.2)
Triglycerides: 97 mg/dL (ref 0–149)
VLDL Cholesterol Cal: 17 mg/dL (ref 5–40)

## 2023-11-12 LAB — HEMOGLOBIN A1C
Est. average glucose Bld gHb Est-mCnc: 111 mg/dL
Hgb A1c MFr Bld: 5.5 % (ref 4.8–5.6)

## 2023-11-12 LAB — VITAMIN D 25 HYDROXY (VIT D DEFICIENCY, FRACTURES): Vit D, 25-Hydroxy: 30.8 ng/mL (ref 30.0–100.0)

## 2023-11-12 LAB — INSULIN, RANDOM: INSULIN: 16 u[IU]/mL (ref 2.6–24.9)

## 2023-11-16 NOTE — Assessment & Plan Note (Signed)
 LDL in March of this year at 123 with a total cholesterol of 207.  HDL and triglycerides at goal on previous lab.  Repeat fasting lipid panel today to assess response to lifestyle changes.  Will need to risk stratify with new results at next appointment.

## 2023-11-16 NOTE — Assessment & Plan Note (Signed)
 Previous vitamin D  level below goal.  Patient has been on supplementation since that time.  Repeat vitamin D  level today.

## 2023-11-16 NOTE — Assessment & Plan Note (Signed)
 Previous hemoglobin A1c better controlled at 5.6 with a minimally elevated insulin  level.  Patient has been working on lifestyle changes.  Will repeat labs today and discuss results at next appointment.

## 2023-11-17 ENCOUNTER — Ambulatory Visit (HOSPITAL_COMMUNITY)
Admission: RE | Admit: 2023-11-17 | Discharge: 2023-11-17 | Disposition: A | Discharge status: Home / Self Care | Source: Ambulatory Visit | Attending: Hematology and Oncology | Admitting: Hematology and Oncology

## 2023-11-17 DIAGNOSIS — R221 Localized swelling, mass and lump, neck: Secondary | ICD-10-CM | POA: Diagnosis present

## 2023-11-17 MED ORDER — IOHEXOL 300 MG/ML  SOLN
75.0000 mL | Freq: Once | INTRAMUSCULAR | Status: AC | PRN
  Administered 2023-11-17: 75 mL via INTRAVENOUS

## 2023-11-20 ENCOUNTER — Inpatient Hospital Stay (HOSPITAL_BASED_OUTPATIENT_CLINIC_OR_DEPARTMENT_OTHER): Admitting: Hematology and Oncology

## 2023-11-20 ENCOUNTER — Ambulatory Visit: Payer: Self-pay | Admitting: Hematology and Oncology

## 2023-11-20 DIAGNOSIS — R221 Localized swelling, mass and lump, neck: Secondary | ICD-10-CM

## 2023-11-20 NOTE — Telephone Encounter (Signed)
 Called pt per Dr Loretha & informed that CT result was good, no masses, & Neg CT.  Pt expressed appreciation.

## 2023-11-20 NOTE — Telephone Encounter (Signed)
-----   Message from Tioga Iruku sent at 11/20/2023  4:35 PM EDT ----- Mazie, can u let her know of the CT results. No masses. Neg CT. ----- Message ----- From: Interface, Rad Results In Sent: 11/20/2023  12:08 PM EDT To: Amber Stalls, MD

## 2023-11-24 ENCOUNTER — Ambulatory Visit (INDEPENDENT_AMBULATORY_CARE_PROVIDER_SITE_OTHER): Admitting: Family Medicine

## 2023-11-24 NOTE — Progress Notes (Signed)
 Pt got the message about the CT results even before my telephone visit. But I couldn't cancel the telephone visit on time. She has improved, neck pain and swelling both improved.  Amber Stalls MD

## 2023-12-14 ENCOUNTER — Ambulatory Visit (INDEPENDENT_AMBULATORY_CARE_PROVIDER_SITE_OTHER): Admitting: Family Medicine

## 2023-12-15 ENCOUNTER — Encounter (INDEPENDENT_AMBULATORY_CARE_PROVIDER_SITE_OTHER): Payer: Self-pay | Admitting: Family Medicine

## 2023-12-15 ENCOUNTER — Ambulatory Visit (INDEPENDENT_AMBULATORY_CARE_PROVIDER_SITE_OTHER): Admitting: Family Medicine

## 2023-12-15 DIAGNOSIS — E669 Obesity, unspecified: Secondary | ICD-10-CM

## 2023-12-15 DIAGNOSIS — R7303 Prediabetes: Secondary | ICD-10-CM

## 2023-12-15 DIAGNOSIS — Z6841 Body Mass Index (BMI) 40.0 and over, adult: Secondary | ICD-10-CM

## 2023-12-15 DIAGNOSIS — E559 Vitamin D deficiency, unspecified: Secondary | ICD-10-CM

## 2023-12-15 MED ORDER — VITAMIN D (ERGOCALCIFEROL) 1.25 MG (50000 UNIT) PO CAPS
50000.0000 [IU] | ORAL_CAPSULE | ORAL | 0 refills | Status: DC
Start: 2023-12-15 — End: 2024-02-10

## 2023-12-15 NOTE — Progress Notes (Signed)
 SUBJECTIVE:  Chief Complaint: Obesity  Interim History: Patient has been focusing on the nutrition plan of pescatarian.  She is working her way of sticking within her calorie budget.  She is drinking more water to stay hydrated.  She has been going to the gym or using her peloton.  She is working on controlling her swelling. She is doing mostly cardio but will add in more resistance as she increases consistency with exercise.  She has been using a portable sauna she and her husband have purchased. She is finding that she is increased in her hunger with an increase in movement.  She is alternating between meals. She is doing smoothies or protein shake in the am.  Joan Dalton is here to discuss her progress with her obesity treatment plan. She is on the [employer] and states she is following her eating plan approximately 75-80 % of the time. She states she is exercising 60 minutes 4-5 times per week.   OBJECTIVE: Visit Diagnoses: Problem List Items Addressed This Visit       Other   Prediabetes   Vitamin D  deficiency   Relevant Medications   Vitamin D , Ergocalciferol , (DRISDOL ) 1.25 MG (50000 UNIT) CAPS capsule   BMI 40.0-44.9, adult (HCC)   Obesity, Beginning BMI 44.39 - Primary    Vitals Temp: 97.8 F (36.6 C) BP: (!) 162/88 Pulse Rate: (!) 101 SpO2: 96 %   Anthropometric Measurements Height: 5' 6 (1.676 m) Weight: 275 lb (124.7 kg) BMI (Calculated): 44.41 Weight at Last Visit: 278 lb Weight Lost Since Last Visit: 3 Weight Gained Since Last Visit: 0 Starting Weight: 275 lb Total Weight Loss (lbs): 0 lb (0 kg)   Body Composition  Body Fat %: 52.9 % Fat Mass (lbs): 145.4 lbs Muscle Mass (lbs): 123 lbs Visceral Fat Rating : 17   Other Clinical Data Today's Visit #: 20 Starting Date: 11/05/21 Comments: Pesc     ASSESSMENT AND PLAN: Assessment & Plan Vitamin D  deficiency No increase in vitamin D  level since last lab.  Patient had labs done at last  appointment.  Discussed today the results and patient will need to continue p.o. prescription strength vitamin D  supplementation.  31-month prescription sent to pharmacy. Obesity, Beginning BMI 44.39  BMI 40.0-44.9, adult (HCC)  Prediabetes Discussed labs from last appointment today.  Hemoglobin A1c has improved to 5.5 from 5.6.  Minor increase in insulin  level since last lab.  Patient to continue on with pharmacotherapy for obesity as well as lifestyle changes.  Will need repeat labs in February 2026.   Diet: Joan Dalton is currently in the action stage of change. As such, her goal is to continue with weight loss efforts and has agreed to keeping a food journal and adhering to recommended goals of 1200 calories and 85 or more grams of protein and the Pescatarian Plan patient to add additional 100 calorie protein snack with 10 or more grams of protein daily.  Exercise:  For substantial health benefits, adults should do at least 150 minutes (2 hours and 30 minutes) a week of moderate-intensity, or 75 minutes (1 hour and 15 minutes) a week of vigorous-intensity aerobic physical activity, or an equivalent combination of moderate- and vigorous-intensity aerobic activity. Aerobic activity should be performed in episodes of at least 10 minutes, and preferably, it should be spread throughout the week.  Behavior Modification:  We discussed the following Behavioral Modification Strategies today: increasing lean protein intake, decreasing simple carbohydrates, increasing vegetables, meal planning and cooking strategies, keeping  healthy foods in the home, and planning for success.   Return in about 4 weeks (around 01/12/2024).   She was informed of the importance of frequent follow up visits to maximize her success with intensive lifestyle modifications for her multiple health conditions.  Attestation Statements:   Reviewed by clinician on day of visit: allergies, medications, problem list, medical  history, surgical history, family history, social history, and previous encounter notes.     Joan Cho, MD

## 2023-12-20 NOTE — Assessment & Plan Note (Signed)
 Discussed labs from last appointment today.  Hemoglobin A1c has improved to 5.5 from 5.6.  Minor increase in insulin  level since last lab.  Patient to continue on with pharmacotherapy for obesity as well as lifestyle changes.  Will need repeat labs in February 2026.

## 2023-12-20 NOTE — Assessment & Plan Note (Signed)
 No increase in vitamin D  level since last lab.  Patient had labs done at last appointment.  Discussed today the results and patient will need to continue p.o. prescription strength vitamin D  supplementation.  23-month prescription sent to pharmacy.

## 2024-01-19 ENCOUNTER — Encounter (INDEPENDENT_AMBULATORY_CARE_PROVIDER_SITE_OTHER): Payer: Self-pay | Admitting: Family Medicine

## 2024-01-19 ENCOUNTER — Ambulatory Visit (INDEPENDENT_AMBULATORY_CARE_PROVIDER_SITE_OTHER): Admitting: Family Medicine

## 2024-01-19 DIAGNOSIS — E7849 Other hyperlipidemia: Secondary | ICD-10-CM | POA: Diagnosis not present

## 2024-01-19 DIAGNOSIS — I10 Essential (primary) hypertension: Secondary | ICD-10-CM

## 2024-01-19 DIAGNOSIS — Z6841 Body Mass Index (BMI) 40.0 and over, adult: Secondary | ICD-10-CM | POA: Diagnosis not present

## 2024-01-19 MED ORDER — TIRZEPATIDE-WEIGHT MANAGEMENT 2.5 MG/0.5ML ~~LOC~~ SOLN: 2.5000 mg | SUBCUTANEOUS | 0 refills | Status: DC

## 2024-01-19 NOTE — Progress Notes (Signed)
   SUBJECTIVE:  Chief Complaint: Obesity  Interim History: Patient mentions she wasn't as consistent on the plan as she wanted to be.  She gave herself a certain period of time in terms of waiting to be more compliant on plan.  She is at the point when can be more stable with a routine she wants to explore more options for intervention for her obesity.  Joan Dalton is here to discuss her progress with her obesity treatment plan. She is on the [employer] and states she is following her eating plan approximately 20 % of the time. She states she is not exercising 0 minutes 0 times per week.   OBJECTIVE: Visit Diagnoses: Problem List Items Addressed This Visit   None   No data recorded No data recorded No data recorded No data recorded   ASSESSMENT AND PLAN: Assessment & Plan Other hyperlipidemia Most recent LDL still above goal.  Patient continues to work on lifestyle modifications.  She is going to start tirzepatide which will help with LDL management.  Will need repeat fasting lipid panel 3 to 4 months after starting medication. Primary hypertension Blood pressure slightly elevated today.  Patient reports this is likely due to her increase in indulge in eating as well as stress.  No chest pain, chest pressure, or headache reported.  Will continue propranolol as needed and no daily blood pressure medication at this time. Morbid obesity (HCC)  BMI 45.0-49.9, adult (HCC)    Diet: Joan Dalton is currently in the action stage of change. As such, her goal is to continue with weight loss efforts and has agreed to the [employer].   Exercise:  All adults should avoid inactivity. Some activity is better than none, and adults who participate in any amount of physical activity, gain some health benefits.  Behavior Modification:  We discussed the following Behavioral Modification Strategies today: increasing lean protein intake, decreasing simple carbohydrates, increasing  vegetables, meal planning and cooking strategies, and planning for success. We discussed various medication options to help Joan Dalton with her weight loss efforts and we both agreed to start tirzepatide 2.5 mg weekly.  Risks and benefits discussed with patient today.  No history of M EN type II syndrome or papillary thyroid cancer.  Common side effects which include nausea, vomiting, constipation or diarrhea discussed with patient.  She agreed to pursue [employer] direct for Zepbound vials.  Discussed how to use Zepbound vials with syringe.  If necessary patient can bring vial into clinic and be shown how to self administer..  No follow-ups on file.   She was informed of the importance of frequent follow up visits to maximize her success with intensive lifestyle modifications for her multiple health conditions.  Attestation Statements:   Reviewed by clinician on day of visit: allergies, medications, problem list, medical history, surgical history, family history, social history, and previous encounter notes.   Joan Cho, MD

## 2024-01-24 NOTE — Assessment & Plan Note (Signed)
 Most recent LDL still above goal.  Patient continues to work on lifestyle modifications.  She is going to start tirzepatide which will help with LDL management.  Will need repeat fasting lipid panel 3 to 4 months after starting medication.

## 2024-02-05 ENCOUNTER — Telehealth: Payer: Self-pay | Admitting: Hematology and Oncology

## 2024-02-05 NOTE — Telephone Encounter (Signed)
 I spoke with patient to re-schedule appointment from 12/1 to 12/4 with Dr. Loretha.

## 2024-02-09 ENCOUNTER — Other Ambulatory Visit (INDEPENDENT_AMBULATORY_CARE_PROVIDER_SITE_OTHER): Payer: Self-pay | Admitting: Family Medicine

## 2024-02-10 ENCOUNTER — Ambulatory Visit (INDEPENDENT_AMBULATORY_CARE_PROVIDER_SITE_OTHER): Payer: Self-pay | Admitting: Family Medicine

## 2024-02-10 VITALS — BP 146/92 | HR 98 | Temp 97.5°F | Ht 66.0 in | Wt 276.0 lb

## 2024-02-10 DIAGNOSIS — R0602 Shortness of breath: Secondary | ICD-10-CM | POA: Diagnosis not present

## 2024-02-10 DIAGNOSIS — Z6841 Body Mass Index (BMI) 40.0 and over, adult: Secondary | ICD-10-CM

## 2024-02-10 DIAGNOSIS — E559 Vitamin D deficiency, unspecified: Secondary | ICD-10-CM

## 2024-02-10 MED ORDER — VITAMIN D (ERGOCALCIFEROL) 1.25 MG (50000 UNIT) PO CAPS: 50000.0000 [IU] | ORAL_CAPSULE | ORAL | 0 refills | Status: DC

## 2024-02-10 MED ORDER — TIRZEPATIDE-WEIGHT MANAGEMENT 5 MG/0.5ML ~~LOC~~ SOLN: 5.0000 mg | SUBCUTANEOUS | 0 refills | Status: DC

## 2024-02-10 NOTE — Progress Notes (Signed)
 SUBJECTIVE:  Chief Complaint: Obesity  Interim History: Patient here for 4 week follow.  Feeling about the same since starting Zepbound .  She tried to eat fried chicken wings and experienced an episode of emesis.  She has otherwise been focused on salads and making sure she is getting in enough protein daily.  She isn't experiencing any major cravings. She does feel like she is able to get total goal intake in.  She is trying to gauge whether the 5mg  is reasonable.  She has been eating earlier in the day.  She has been dealing with feeling of fluid retention and wheeze. [place] be [place] for [place].   Joan Dalton is here to discuss her progress with her obesity treatment plan. She is on the [employer] and states she is following her eating plan approximately 75 % of the time. She states she is exercising 60 minutes 3 times per week.   OBJECTIVE: Visit Diagnoses: Problem List Items Addressed This Visit       Other   Vitamin D  deficiency   Relevant Medications   Vitamin D , Ergocalciferol , (DRISDOL ) 1.25 MG (50000 UNIT) CAPS capsule   BMI 40.0-44.9, adult (HCC)   Relevant Medications   tirzepatide  5 MG/0.5ML injection vial   Obesity, Beginning BMI 44.39   Relevant Medications   tirzepatide  5 MG/0.5ML injection vial   SOBOE (shortness of breath on exertion) - Primary   Relevant Orders   ECHOCARDIOGRAM COMPLETE    Vitals Temp: (!) 97.5 F (36.4 C) BP: (!) 146/92 Pulse Rate: 98 SpO2: 98 %   Anthropometric Measurements Height: 5' 6 (1.676 m) Weight: 276 lb (125.2 kg) BMI (Calculated): 44.57 Weight at Last Visit: 280 lb Weight Lost Since Last Visit: 4 Weight Gained Since Last Visit: 0 Starting Weight: 275 lb Total Weight Loss (lbs): 0 lb (0 kg)   Body Composition  Body Fat %: 52.4 % Fat Mass (lbs): 144.8 lbs Muscle Mass (lbs): 125 lbs Visceral Fat Rating : 17   Other Clinical Data Today's Visit #: 22 Starting Date: 11/05/21 Comments:  Pesc     ASSESSMENT AND PLAN: Assessment & Plan SOBOE (shortness of breath on exertion) Patient is experiencing shortness of breath with any significant exertion.  She also reports some swelling of lower extremities bilaterally.  Will order echocardiogram and follow-up on results after.  Treatment pending. BMI 40.0-44.9, adult (HCC)  Vitamin D  deficiency On vitamin D  prescription strength supplementation with no side effects.  Refill sent to pharmacy today.  Will need repeat lab in 3 months when finishing prescription.   Morbid obesity (HCC)    Diet: Joan Dalton is currently in the action stage of change. As such, her goal is to continue with weight loss efforts and has agreed to the [employer].   Exercise:  For substantial health benefits, adults should do at least 150 minutes (2 hours and 30 minutes) a week of moderate-intensity, or 75 minutes (1 hour and 15 minutes) a week of vigorous-intensity aerobic physical activity, or an equivalent combination of moderate- and vigorous-intensity aerobic activity. Aerobic activity should be performed in episodes of at least 10 minutes, and preferably, it should be spread throughout the week.  Behavior Modification:  We discussed the following Behavioral Modification Strategies today: increasing lean protein intake, decreasing simple carbohydrates, increasing vegetables, meal planning and cooking strategies, holiday eating strategies, and planning for success. We discussed various medication options to help Joan Dalton with her weight loss efforts and we both agreed to increase zepbound  to 5mg  weekly.  Return in about 4 weeks (around 03/09/2024).   She was informed of the importance of frequent follow up visits to maximize her success with intensive lifestyle modifications for her multiple health conditions.  Attestation Statements:   Reviewed by clinician on day of visit: allergies, medications, problem list, medical history, surgical  history, family history, social history, and previous encounter notes.     Joan Cho, MD

## 2024-02-18 NOTE — Assessment & Plan Note (Signed)
 Patient is experiencing shortness of breath with any significant exertion.  She also reports some swelling of lower extremities bilaterally.  Will order echocardiogram and follow-up on results after.  Treatment pending.

## 2024-02-18 NOTE — Assessment & Plan Note (Signed)
 On vitamin D  prescription strength supplementation with no side effects.  Refill sent to pharmacy today.  Will need repeat lab in 3 months when finishing prescription.

## 2024-02-22 ENCOUNTER — Ambulatory Visit: Admitting: Hematology and Oncology

## 2024-02-25 ENCOUNTER — Inpatient Hospital Stay: Attending: Hematology and Oncology | Admitting: Hematology and Oncology

## 2024-02-25 VITALS — BP 139/96 | HR 106 | Temp 97.6°F | Resp 18 | Ht 66.0 in | Wt 279.1 lb

## 2024-02-25 DIAGNOSIS — Z923 Personal history of irradiation: Secondary | ICD-10-CM | POA: Diagnosis not present

## 2024-02-25 DIAGNOSIS — Z79810 Long term (current) use of selective estrogen receptor modulators (SERMs): Secondary | ICD-10-CM | POA: Diagnosis not present

## 2024-02-25 DIAGNOSIS — D0512 Intraductal carcinoma in situ of left breast: Secondary | ICD-10-CM | POA: Insufficient documentation

## 2024-02-25 NOTE — Progress Notes (Signed)
 [facility] CONSULT NOTE  Patient Care Team: Vernon Velna SAUNDERS, MD as PCP - General (Internal Medicine) Loretha Ash, MD as Consulting Physician (Hematology and Oncology) Curvin Deward MOULD, MD as Consulting Physician (General Surgery) Dewey Rush, MD as Consulting Physician (Radiation Oncology) Dillingham, Estefana RAMAN, DO as Attending Physician (Plastic Surgery)  CHIEF COMPLAINTS/PURPOSE OF CONSULTATION:  Breast cancer  HISTORY OF PRESENTING ILLNESS:  Joan Dalton 48 y.o. female is here because of recent diagnosis of left breast cancer  I reviewed her records extensively and collaborated the history with the patient.  SUMMARY OF ONCOLOGIC HISTORY: Oncology History  Ductal carcinoma in situ (DCIS) of left breast  05/20/2021 Initial Diagnosis   Ductal carcinoma in situ (DCIS) of left breast   06/05/2021 Genetic Testing   Negative genetic testing on the CancerNext-Expanded+RNAinsight panel.  The report date is June 05, 2021.  The CancerNext-Expanded gene panel offered by [facility] and includes sequencing and rearrangement analysis for the following 77 genes: AIP, ALK, APC*, ATM*, AXIN2, BAP1, BARD1, BLM, BMPR1A, BRCA1*, BRCA2*, BRIP1*, CDC73, CDH1*, CDK4, CDKN1B, CDKN2A, CHEK2*, CTNNA1, DICER1, FANCC, FH, FLCN, GALNT12, KIF1B, LZTR1, MAX, MEN1, MET, MLH1*, MSH2*, MSH3, MSH6*, MUTYH*, NBN, NF1*, NF2, NTHL1, PALB2*, PHOX2B, PMS2*, POT1, PRKAR1A, PTCH1, PTEN*, RAD51C*, RAD51D*, RB1, RECQL, RET, SDHA, SDHAF2, SDHB, SDHC, SDHD, SMAD4, SMARCA4, SMARCB1, SMARCE1, STK11, SUFU, TMEM127, TP53*, TSC1, TSC2, VHL and XRCC2 (sequencing and deletion/duplication); EGFR, EGLN1, HOXB13, KIT, MITF, PDGFRA, POLD1, and POLE (sequencing only); EPCAM and GREM1 (deletion/duplication only). DNA and RNA analyses performed for * genes.    06/17/2021 Surgery   Patient had left breast lumpectomy which showed high-grade ductal carcinoma in situ with necrosis and calcifications, resection margins are  negative for carcinoma left additional superior margin excision again negative for carcinoma.  She had 3 out of 3 lymph nodes without involvement.  Prior prognostic showed ER 85% positive strong staining PR 95% positive strong staining. She is now undergoing plastic surgery, had right and left breast mammoplasty with no evidence of malignancy.   06/17/2021 Cancer Staging   Staging form: Breast, AJCC 8th Edition - Clinical stage from 06/17/2021: Stage 0 (cTis (DCIS), cN0, cM0, ER+, PR+) - Signed by Crawford Morna Pickle, NP on 01/30/2022   08/28/2021 - 10/16/2021 Radiation Therapy   Site Technique Total Dose (Gy) Dose per Fx (Gy) Completed Fx Beam Energies  Breast, Left: Breast_L 3D 50.4/50.4 1.8 28/28 10XFFF  Breast, Left: Breast_L_Bst 3D 10/10 2 5/5 6X, 10X     10/2021 -  Anti-estrogen oral therapy   Tamoxifen      History of Present Illness  Joan Dalton is a 48 year old female with breast cancer on tamoxifen  who presents with weight gain, joint pain, and fluid retention.  She has experienced progressive weight gain, significant swelling, and fluid retention, particularly in her feet and legs. Joint pain is present, especially in the back of her legs and calves, and is most severe upon waking, making it difficult to get out of bed. She describes her morning stiffness as feeling like 'an 48 year old woman getting out of bed.'  She has been on tamoxifen  for over two years, starting in August 2023. She is concerned about potential side effects of the medication but also considers that her symptoms may be related to perimenopausal changes.  To address her weight gain, she consulted with a healthy weight doctor and started on Zepbound . Initially, she was on a low dose, resulting in minimal weight loss of about four pounds. Her  dose was increased to 5 mg, which has helped reduce her appetite, though she still experiences hunger if she stays up late. She is focusing on a diet with more  protein and water intake and plans to incorporate exercise into her routine after her classes end this week.  She experiences nausea when consuming fried foods while on Zepbound , which has made her more conscious of her dietary choices. She is also taking a supplement recommended by a pharmacist friend, which includes garlic, echinacea, and elderberry, to prevent colds, and she reports it has been effective so far.  Rest of the pertinent 10 point ROS reviewed and negative  MEDICAL HISTORY:  Past Medical History:  Diagnosis Date   Anemia    Anxiety    Bilateral swelling of feet    Breast cancer (HCC) 05/08/2021   Constipation    Family history of breast cancer    Family history of colon cancer    Family history of pancreatic cancer    Fatty liver    Hx of blood clots    Hypertension    Joint pain    Joint pain    Lactose intolerance    Palpitations    Personal history of radiation therapy    Vitamin D  deficiency     SURGICAL HISTORY: Past Surgical History:  Procedure Laterality Date   ABLATION     D&C Ablation of Uterus   AXILLARY SENTINEL NODE BIOPSY Left 06/17/2021   Procedure: LEFT AXILLARY SENTINEL NODE BIOPSY;  Surgeon: Curvin Deward MOULD, MD;  Location: [facility] OR;  Service: General;  Laterality: Left;   BREAST BIOPSY Left 05/08/2021   BREAST BIOPSY Right 05/07/2022   US  RT BREAST BX W LOC DEV 1ST LESION IMG BX SPEC US  GUIDE 05/07/2022 [facility]   BREAST LUMPECTOMY Right 10/1998   BREAST LUMPECTOMY WITH RADIOACTIVE SEED AND SENTINEL LYMPH NODE BIOPSY Left 06/17/2021   Procedure: LEFT BREAST LUMPECTOMY WITH RADIOACTIVE SEEDS x 2;  Surgeon: Curvin Deward MOULD, MD;  Location: [facility] OR;  Service: General;  Laterality: Left;   BREAST REDUCTION SURGERY Bilateral 07/24/2021   Procedure: BILATERAL ONCOPLASTIC BREAST REDUCTION;  Surgeon: Lowery Estefana RAMAN, DO;  Location: [facility] OR;  Service: Plastics;  Laterality: Bilateral;   MASTOPEXY Bilateral 07/24/2021   Procedure: MASTOPEXY;   Surgeon: Lowery Estefana RAMAN, DO;  Location: [facility] OR;  Service: Plastics;  Laterality: Bilateral;   REDUCTION MAMMAPLASTY      SOCIAL HISTORY: Social History   Socioeconomic History   Marital status: Married    Spouse name: TRICHA RUGGIRELLO II   Number of children: 5   Years of education: Not on file   Highest education level: Not on file  Occupational History   Not on file  Tobacco Use   Smoking status: Never   Smokeless tobacco: Never  Vaping Use   Vaping status: Never Used  Substance and Sexual Activity   Alcohol use: Never   Drug use: Never   Sexual activity: Yes  Other Topics Concern   Not on file  Social History Narrative   Not on file   Social Drivers of Health   Financial Resource Strain: Not on file  Food Insecurity: Not on file  Transportation Needs: Not on file  Physical Activity: Not on file  Stress: Not on file  Social Connections: Not on file  Intimate Partner Violence: Not At Risk (05/21/2021)   Humiliation, Afraid, Rape, and Kick questionnaire    Fear of Current or Ex-Partner: No    Emotionally  Abused: No    Physically Abused: No    Sexually Abused: No    FAMILY HISTORY: Family History  Problem Relation Age of Onset   Breast cancer Mother        30s & 56s   Hypertension Father    Hyperlipidemia Father    Diabetes Father    Prostate cancer Father    Heart disease Father    Sleep apnea Father    Obesity Father    Colon cancer Maternal Grandfather    Breast cancer Maternal Aunt 9   Pancreatic cancer Maternal Aunt 41   Breast cancer Maternal Aunt        later 30s-early 49s   Colon cancer Maternal Aunt 42   Colon cancer Maternal Uncle        60s   Pancreatic cancer Cousin        mother's maternal first cousin   Pancreatic cancer Cousin        mother's maternal first cousin    ALLERGIES:  is allergic to sulfamethoxazole-trimethoprim.  MEDICATIONS:  Current Outpatient Medications  Medication Sig Dispense Refill   brimonidine  (ALPHAGAN) 0.2 % ophthalmic solution Place 1 drop into the left eye 3 (three) times daily.     ELIQUIS  5 MG TABS tablet TAKE 1 TABLET BY MOUTH TWICE A DAY 60 tablet 4   Multiple Vitamin (MULTI-VITAMIN) tablet Take 1 tablet by mouth daily.     Probiotic Product (PROBIOTIC-10 PO) Take by mouth.     propranolol (INDERAL) 20 MG tablet Take 20 mg by mouth 2 (two) times daily as needed.     tamoxifen  (NOLVADEX ) 20 MG tablet TAKE 1 TABLET BY MOUTH EVERY DAY 90 tablet 1   tirzepatide  5 MG/0.5ML injection vial Inject 5 mg into the skin once a week. 2 mL 0   Vitamin D , Ergocalciferol , (DRISDOL ) 1.25 MG (50000 UNIT) CAPS capsule Take 1 capsule (50,000 Units total) by mouth every 7 (seven) days. 12 capsule 0   No current facility-administered medications for this visit.     PHYSICAL EXAMINATION: ECOG PERFORMANCE STATUS: 0 - Asymptomatic  Vitals:   02/25/24 1000 02/25/24 1012  BP: (!) 165/110 (!) 139/96  Pulse: (!) 106   Resp: 18   Temp: 97.6 F (36.4 C)   SpO2: 99%      Filed Weights   02/25/24 1000  Weight: 279 lb 1.6 oz (126.6 kg)     Physical Exam Constitutional:      Appearance: Normal appearance.  Chest:     Comments: Bilateral breasts inspected and palpated. Left breast with post radiation changes. No other palpable masses or regional adenopathy Neurological:     Mental Status: She is alert.      LABORATORY DATA:  I have reviewed the data as listed Lab Results  Component Value Date   WBC 5.0 08/18/2023   HGB 12.0 08/18/2023   HCT 35.9 (L) 08/18/2023   MCV 93.0 08/18/2023   PLT 227 08/18/2023   Lab Results  Component Value Date   NA 141 11/10/2023   K 4.4 11/10/2023   CL 104 11/10/2023   CO2 23 11/10/2023    RADIOGRAPHIC STUDIES: I have personally reviewed the radiological reports and agreed with the findings in the report.  ASSESSMENT AND PLAN:   This is a very pleasant 48 year old female patient (menopausal status pre menopausal, last period Several years  ago but had endometrial ablation) referred to hematology/oncology for DCIS of the right breast and recommendations regarding the above.  She  has now completed adjuvant radiation and is here on tamoxifen . Her annual diagnostic mammogram is on Apr 16, 2023. Assessment & Plan  DCIS of the right breast. History of breast cancer, on tamoxifen  therapy Joint pain and morning stiffness likely due to perimenopausal effects as well as some from tamoxifen  Consider reducing tamoxifen  dose if joint pain becomes unbearable. - She is not ready to reduce the dose.  Obesity with peripheral edema Weight gain and peripheral edema managed with Zepbound  and lifestyle changes. - - Encouraged dietary modifications focusing on protein intake and hydration. - Initiated strength training exercises.  Mammogram scheduled for Jan 26 CMP every 6-12 months, last one in Aug normal, she will follow up with her PCP and have this blood work drawn Follow-up in 6 months.  Total time spent 30 minutes including review of history, records, counseling about antiestrogen therapy options, coordination of care  Thank you for consulting us  in the care of this patient.  Please not hesitate to contact us  with any additional questions or concerns.

## 2024-03-07 ENCOUNTER — Telehealth (INDEPENDENT_AMBULATORY_CARE_PROVIDER_SITE_OTHER): Payer: Self-pay

## 2024-03-07 NOTE — Telephone Encounter (Signed)
 [id number] Echocardiogram prior authorization from [facility] not required, No PA not needed

## 2024-03-08 ENCOUNTER — Other Ambulatory Visit (INDEPENDENT_AMBULATORY_CARE_PROVIDER_SITE_OTHER): Payer: Self-pay | Admitting: Family Medicine

## 2024-03-08 ENCOUNTER — Ambulatory Visit (INDEPENDENT_AMBULATORY_CARE_PROVIDER_SITE_OTHER): Admitting: Family Medicine

## 2024-03-08 ENCOUNTER — Encounter (INDEPENDENT_AMBULATORY_CARE_PROVIDER_SITE_OTHER): Payer: Self-pay | Admitting: Family Medicine

## 2024-03-08 VITALS — BP 142/94 | HR 96 | Temp 98.0°F | Ht 66.0 in | Wt 274.0 lb

## 2024-03-08 DIAGNOSIS — Z6841 Body Mass Index (BMI) 40.0 and over, adult: Secondary | ICD-10-CM

## 2024-03-08 DIAGNOSIS — R7303 Prediabetes: Secondary | ICD-10-CM

## 2024-03-08 DIAGNOSIS — I10 Essential (primary) hypertension: Secondary | ICD-10-CM | POA: Diagnosis not present

## 2024-03-08 MED ORDER — TIRZEPATIDE-WEIGHT MANAGEMENT 7.5 MG/0.5ML ~~LOC~~ SOLN: 7.5000 mg | SUBCUTANEOUS | 0 refills | Status: DC

## 2024-03-08 NOTE — Progress Notes (Signed)
" ° °  SUBJECTIVE:  Chief Complaint: Obesity  Interim History: Patient has been taking supplements to help decrease risk of illness. Patient mentions her Thanksgiving was fine- she didn't eat more than 1 plate and felt satisfied.  She is finding her intake to occasionally be less and other times to have no change from what it was previously.  She still needs to be careful of her intake.  She is occasionally still skipping meals.  She wants to design a meal plan for over Christmas.  She is unofficially working over the holidays.   Joan Dalton is here to discuss her progress with her obesity treatment plan. She is on the [employer] and states she is following her eating plan approximately 50 % of the time. She states she is exercising 20 minutes 1 times per week.   OBJECTIVE: Visit Diagnoses: Problem List Items Addressed This Visit       Other   Obesity, Beginning BMI 44.39 - Primary   Relevant Medications   tirzepatide  7.5 MG/0.5ML injection vial    Vitals Temp: 98 F (36.7 C) BP: (!) 142/94 Pulse Rate: 96 SpO2: 98 %   Anthropometric Measurements Height: 5' 6 (1.676 m) Weight: 274 lb (124.3 kg) BMI (Calculated): 44.25 Weight at Last Visit: 276 lb Weight Lost Since Last Visit: 2 Weight Gained Since Last Visit: 0 Starting Weight: 275 lb Total Weight Loss (lbs): 1 lb (0.454 kg)   Body Composition  Body Fat %: 52.6 % Fat Mass (lbs): 144.2 lbs Muscle Mass (lbs): 123.4 lbs Total Body Water (lbs): 101.6 lbs Visceral Fat Rating : 17   Other Clinical Data Today's Visit #: 23 Starting Date: 11/05/21 Comments: Pesc     ASSESSMENT AND PLAN: Assessment & Plan Primary hypertension Blood pressure slightly elevated today.  Patient's blood pressure has been elevated over the last few appointments.  We discussed the possibility of needing to increase her dosage of medication for better blood pressure control.  Will plan to reevaluate blood pressure management at next  appointment and if necessary change or increase medications. Prediabetes Patient is using injectable GLP-1/GIP therapy for blood sugar control.  She is still sometimes needing to be more mindful about her intake and is occasionally skipping meals.  Will continue tirzepatide  but will increase dose to 7.5 mg weekly. BMI 40.0-44.9, adult (HCC)  Morbid obesity (HCC)    Diet: Joan Dalton is currently in the action stage of change. As such, her goal is to continue with weight loss efforts and has agreed to keeping a food journal and adhering to recommended goals of 1000-1100 calories and 80 or more grams protein daily  Exercise:  All adults should avoid inactivity. Some activity is better than none, and adults who participate in any amount of physical activity, gain some health benefits.  Behavior Modification:  We discussed the following Behavioral Modification Strategies today: increasing lean protein intake, decreasing simple carbohydrates, meal planning and cooking strategies, and holiday eating strategies. We discussed various medication options to help Joan Dalton with her weight loss efforts and we both agreed to continue tirzepatide  and will send in higher dose  Return in about 4 weeks (around 04/05/2024).   She was informed of the importance of frequent follow up visits to maximize her success with intensive lifestyle modifications for her multiple health conditions.  Attestation Statements:   Reviewed by clinician on day of visit: allergies, medications, problem list, medical history, surgical history, family history, social history, and previous encounter notes.    Joan Cho, MD "

## 2024-03-21 NOTE — Assessment & Plan Note (Signed)
 Patient is using injectable GLP-1/GIP therapy for blood sugar control.  She is still sometimes needing to be more mindful about her intake and is occasionally skipping meals.  Will continue tirzepatide  but will increase dose to 7.5 mg weekly.

## 2024-04-11 ENCOUNTER — Telehealth (INDEPENDENT_AMBULATORY_CARE_PROVIDER_SITE_OTHER): Payer: Self-pay | Admitting: Family Medicine

## 2024-04-11 NOTE — Telephone Encounter (Signed)
 Patient called stating that [facility] contatced her stating that her RX for Zepbound  needs refills. Pt will be in the office this week. [facility].

## 2024-04-12 ENCOUNTER — Ambulatory Visit (INDEPENDENT_AMBULATORY_CARE_PROVIDER_SITE_OTHER): Admitting: Family Medicine

## 2024-04-12 ENCOUNTER — Other Ambulatory Visit (INDEPENDENT_AMBULATORY_CARE_PROVIDER_SITE_OTHER): Payer: Self-pay | Admitting: Family Medicine

## 2024-04-12 ENCOUNTER — Encounter (INDEPENDENT_AMBULATORY_CARE_PROVIDER_SITE_OTHER): Payer: Self-pay | Admitting: Family Medicine

## 2024-04-12 DIAGNOSIS — I10 Essential (primary) hypertension: Secondary | ICD-10-CM

## 2024-04-12 DIAGNOSIS — Z6841 Body Mass Index (BMI) 40.0 and over, adult: Secondary | ICD-10-CM | POA: Diagnosis not present

## 2024-04-12 DIAGNOSIS — R7303 Prediabetes: Secondary | ICD-10-CM | POA: Diagnosis not present

## 2024-04-12 DIAGNOSIS — E559 Vitamin D deficiency, unspecified: Secondary | ICD-10-CM | POA: Diagnosis not present

## 2024-04-12 MED ORDER — VITAMIN D (ERGOCALCIFEROL) 1.25 MG (50000 UNIT) PO CAPS: 50000.0000 [IU] | ORAL_CAPSULE | ORAL | 0 refills | Status: AC

## 2024-04-12 MED ORDER — TIRZEPATIDE-WEIGHT MANAGEMENT 7.5 MG/0.5ML ~~LOC~~ SOLN: 7.5000 mg | SUBCUTANEOUS | 0 refills | Status: DC

## 2024-04-12 NOTE — Progress Notes (Signed)
 "  Joan Dalton, D.O.  ABFM, ABOM Specializing in Clinical Bariatric Medicine  Office located at: [address] [place], [place]  [address]    Medications Discontinued During This Encounter  Medication Reason   tirzepatide  5 MG/0.5ML injection vial    Vitamin D , Ergocalciferol , (DRISDOL ) 1.25 MG (50000 UNIT) CAPS capsule Reorder   tirzepatide  7.5 MG/0.5ML injection vial Reorder     Meds ordered this encounter  Medications   tirzepatide  7.5 MG/0.5ML injection vial    Sig: Inject 7.5 mg into the skin once a week.    Dispense:  2 mL    Refill:  0   Vitamin D , Ergocalciferol , (DRISDOL ) 1.25 MG (50000 UNIT) CAPS capsule    Sig: Take 1 capsule (50,000 Units total) by mouth every 7 (seven) days.    Dispense:  12 capsule    Refill:  0      FOR THE CHRONIC DISEASE OF OBESITY:  Chief complaint: Obesity Joan Dalton is here to discuss her progress with her obesity treatment plan.   History of present illness / Interval history:  Joan Dalton is here today for her follow-up office visit.  Since last OV on 03/08/24 with provider: Dr.U , patient states that she thinks that it is going okay.   Pt has been struggling with:  [x]  meal planning and prepping []  exercise []  sleep []  stressors []  mood []  chronic or acute medical conditions-  []  eating out more []  Nothing, doing great  Pt has been working on and improving their:  [x]  meal planning and prepping []  exercise []  sleep hygiene [x]  water intake []  strategies to better manage personal stressors []  strategies to better manage mood []  eating out less []  Nothing particular at this time  When asked by CMA prior to our office visit today, pt states they have been:  - Focused on eating fresh, unprocessed foods?  Yes  - Focused on eating lean proteins with each meal?  Yes  - Sleeping 7-9 Hours/ Night?   No - Skipping Meals? Yes - States she is following her healthy eating plan approximately 80 % of the  time.   Recent weight loss data history   03/08/24 11:00 04/12/24   Body Fat % 52.6 % 52.1 %  Muscle Mass (lbs) 123.4 lbs 124.8 lbs  Fat Mass (lbs) 144.2 lbs 143 lbs  Total Body Water (lbs) 101.6 lbs 100 lbs  Visceral Fat Rating  17 17    Counseling done on how various foods/ behaviors will affect these numbers and how to maximize weight loss success discussed today in detail based on these findings.  - First 6 months lost 16 lbs  Total lbs lost to date: -1 lbs Total Fat Mass in lbs lost to date: -0.6 lbs Total weight loss percentage to date since starting program:  - 0.36 %    Morbid obesity (HCC) BMI 45.0-49.9, adult [facility])  Physician directed Nutrition Therapy prescription: She is on keeping a food journal and adhering to recommended goals of 1000-1100 calories and 80+ protein.    Joan Dalton is currently in the action stage of change. As such, her goal is to Journal accurately and consistently   She has agreed to: continue current reduced-calorie meal plan    Physician directed Behavioral Modification prescription: Evidence-based interventions for healthy behavior change were utilized today including the discussion of small changes and SMART goals. barriers to successful adherence to behavorial change for wt loss: not motivated to create healthier behavioral changes at  this time  We discussed the following today: increasing lean protein intake to established goals, avoiding skipping meals, increasing water intake , keeping healthy foods at home, and planning for success    Physician directed Physical Activity prescription: Joan Dalton is indoor bike and sauna 30-60  minutes 1-2 days per week barriers to successful adherence to exercise for wt loss: not enough time and transportation issues  Logan has been educated on importance of strength training to enhance/ sustain muscle mass along with prudent nutrition and how exercise will help regulate blood sugar  levels and prevent excess fat storage  Exercise prescription: She has agreed to Increase the intensity, frequency or duration of strengthening exercises     Medical Interventions/ Pharmacotherapy  Previously tried medical weight loss medications/ therapies: None  Pharmacotherapy for weight loss: She is currently taking Tirzepatide  7.5 mg weekly for medical weight loss.    Patient states that she is skipping meals. She has not been journaling. She is aware that the medication is helpful and increasing from the 5 mg to the 7.5 mg has helped but she wants to works on journaling and eating healthy. Will refill today 7.5 mg. Stressed to patient the importance of eating lean proteins and avoiding skipping meals. Continue with medication.    We discussed various medication options to help Tomeko with her weight loss efforts and we both agreed to:  Adequate clinical response to anti-obesity medication, continue current anti-obesity regimen  SPECIFIC behavorial / nutritional / exercise goals for next office visit:  Journal and track every meal and food   B) OBESITY RELATED CONDITIONS ADDRESSED TODAY:  Prediabetes Assessment & Plan Lab Results  Component Value Date   HGBA1C 5.5 11/10/2023   HGBA1C 5.6 06/08/2023   HGBA1C 5.5 11/17/2022   INSULIN  16.0 11/10/2023   INSULIN  11.3 06/08/2023   INSULIN  15.0 11/17/2022    Diet and life style controlled. Patient states that her hunger and cravings are well controlled. Counseling on nutrition was done with patient today. Handout on Character of successful weight losers and weight maintainers was given to patient. Stressed to patient the importance of eating all lean proteins. Continue following prudent nutritional meal plan and decreasing simple carbs.     Vitamin D  deficiency Assessment & Plan Lab Results  Component Value Date   VD25OH 30.8 11/10/2023   VD25OH 34.5 06/08/2023   VD25OH 24.9 (L) 11/17/2022   Taking ERGO 50K units once  every 7 days. With reported good compliance and tolerance. Patient states that she has no issue remembering to take the supplement. Reminded patient the importance of having at goal Vit D levels. Will refill today. Continue with supplementation.     Primary hypertension Assessment & Plan BP Readings from Last 3 Encounters:  04/12/24 (!) 142/92  03/08/24 (!) 142/94  02/25/24 (!) 139/96   The 10-year ASCVD risk score (Arnett DK, et al., 2019) is: 4.3%  Lab Results  Component Value Date   CREATININE 0.74 11/10/2023   Hypertension Condition worsening, worsening disease monitoring labs and reviewed associated risk factors and strategies to reduce risk, reviewed previous records. Stressed to patient the importance of getting HTN under control. Patient states that she is not on anything for her HTN. She denies checking her BP at home. Recommended that patient reach out to PCP about getting on medication due to several high BP readings.    Follow up:   Return 05/10/2024 at 9:20 AM   She was informed of the importance of frequent follow up visits  to maximize her success with intensive lifestyle modifications for her multiple health conditions.    Weight Summary and Biometrics   Weight Lost Since Last Visit: 0lb  Weight Gained Since Last Visit: 0lb   Vitals Temp: 98.1 F (36.7 C) BP: (!) 142/92 Pulse Rate: (!) 103 SpO2: 96 %   Anthropometric Measurements Height: 5' 6 (1.676 m) Weight: 274 lb (124.3 kg) BMI (Calculated): 44.25 Weight at Last Visit: 274lb Weight Lost Since Last Visit: 0lb Weight Gained Since Last Visit: 0lb Starting Weight: 275lb Total Weight Loss (lbs): 1 lb (0.454 kg)   Body Composition  Body Fat %: 52.1 % Fat Mass (lbs): 143 lbs Muscle Mass (lbs): 124.8 lbs Total Body Water (lbs): 100 lbs Visceral Fat Rating : 17   Other Clinical Data Fasting: yes Labs: no Today's Visit #: 24 Starting Date: 11/05/21    Objective:   PHYSICAL EXAM: Blood  pressure (!) 142/92, pulse (!) 103, temperature 98.1 F (36.7 C), height 5' 6 (1.676 m), weight 274 lb (124.3 kg), SpO2 96%. Body mass index is 44.22 kg/m. General: she is overweight, cooperative and in no acute distress. PSYCH: Has normal mood, affect and thought process.   HEENT: EOMI, sclerae are anicteric. Lungs: Normal breathing effort, no conversational dyspnea. Extremities: Moves * 4 Neurologic: A and O * 3, good insight  DIAGNOSTIC DATA REVIEWED: BMET    Component Value Date/Time   NA 141 11/10/2023 1015   K 4.4 11/10/2023 1015   CL 104 11/10/2023 1015   CO2 23 11/10/2023 1015   GLUCOSE 102 (H) 11/10/2023 1015   GLUCOSE 98 08/18/2023 0914   BUN 10 11/10/2023 1015   CREATININE 0.74 11/10/2023 1015   CREATININE 0.63 08/18/2023 0914   CALCIUM 9.4 11/10/2023 1015   GFRNONAA >60 08/18/2023 0914   Lab Results  Component Value Date   HGBA1C 5.5 11/10/2023   HGBA1C 5.6 11/05/2021   Lab Results  Component Value Date   INSULIN  16.0 11/10/2023   INSULIN  19.4 11/05/2021   Lab Results  Component Value Date   TSH 1.080 11/05/2021   CBC    Component Value Date/Time   WBC 5.0 08/18/2023 0914   WBC 6.1 07/22/2021 0811   RBC 3.86 (L) 08/18/2023 0914   HGB 12.0 08/18/2023 0914   HGB 13.3 11/05/2021 0921   HCT 35.9 (L) 08/18/2023 0914   HCT 40.0 11/05/2021 0921   PLT 227 08/18/2023 0914   PLT 266 11/05/2021 0921   MCV 93.0 08/18/2023 0914   MCV 95 11/05/2021 0921   MCH 31.1 08/18/2023 0914   MCHC 33.4 08/18/2023 0914   RDW 12.9 08/18/2023 0914   RDW 12.8 11/05/2021 0921   Iron Studies No results found for: IRON, TIBC, FERRITIN, IRONPCTSAT Lipid Panel     Component Value Date/Time   CHOL 197 11/10/2023 1015   TRIG 97 11/10/2023 1015   HDL 63 11/10/2023 1015   LDLCALC 117 (H) 11/10/2023 1015   Hepatic Function Panel     Component Value Date/Time   PROT 6.8 11/10/2023 1015   ALBUMIN 4.2 11/10/2023 1015   AST 17 11/10/2023 1015   AST 12 (L)  08/18/2023 0914   ALT 21 11/10/2023 1015   ALT 17 08/18/2023 0914   ALKPHOS 52 11/10/2023 1015   BILITOT 0.4 11/10/2023 1015   BILITOT 0.3 08/18/2023 0914      Component Value Date/Time   TSH 1.080 11/05/2021 0921   Nutritional Lab Results  Component Value Date   VD25OH 30.8 11/10/2023  VD25OH 34.5 06/08/2023   VD25OH 24.9 (L) 11/17/2022    Attestations:   Joan Dalton, acting as a medical scribe for Joan Jenkins, DO., have compiled all relevant documentation for today's office visit on behalf of Joan Jenkins, DO, while in the presence of [employer], DO.  I have reviewed the above documentation for accuracy and completeness, and I agree with the above. Joan Dalton, D.O.  The 21st Century Cures Act was signed into law in 2016 which includes the topic of electronic health records.  This provides immediate access to information in MyChart.  This includes consultation notes, operative notes, office notes, lab results and pathology reports.  If you have any questions about what you read please let us  know at your next visit so we can discuss your concerns and take corrective action if need be.  We are right here with you.  "

## 2024-04-14 ENCOUNTER — Ambulatory Visit (INDEPENDENT_AMBULATORY_CARE_PROVIDER_SITE_OTHER): Admitting: Family Medicine

## 2024-04-15 ENCOUNTER — Ambulatory Visit (HOSPITAL_COMMUNITY)
Admission: RE | Admit: 2024-04-15 | Discharge: 2024-04-15 | Disposition: A | Source: Ambulatory Visit | Attending: Family Medicine

## 2024-04-15 DIAGNOSIS — R0602 Shortness of breath: Secondary | ICD-10-CM | POA: Diagnosis present

## 2024-04-15 LAB — ECHOCARDIOGRAM COMPLETE
Area-P 1/2: 3.3 cm2
S' Lateral: 2.4 cm

## 2024-04-18 ENCOUNTER — Encounter

## 2024-04-19 ENCOUNTER — Ambulatory Visit
Admission: RE | Admit: 2024-04-19 | Discharge: 2024-04-19 | Disposition: A | Source: Ambulatory Visit | Attending: Hematology and Oncology

## 2024-04-19 DIAGNOSIS — D0512 Intraductal carcinoma in situ of left breast: Secondary | ICD-10-CM

## 2024-04-26 ENCOUNTER — Telehealth (INDEPENDENT_AMBULATORY_CARE_PROVIDER_SITE_OTHER): Payer: Self-pay | Admitting: Family Medicine

## 2024-04-26 ENCOUNTER — Other Ambulatory Visit (INDEPENDENT_AMBULATORY_CARE_PROVIDER_SITE_OTHER): Payer: Self-pay

## 2024-04-26 MED ORDER — TIRZEPATIDE-WEIGHT MANAGEMENT 7.5 MG/0.5ML ~~LOC~~ SOLN: 7.5000 mg | SUBCUTANEOUS | 0 refills | Status: AC

## 2024-04-26 NOTE — Telephone Encounter (Signed)
 Patient called stating that she is out of Zepbound  The latest RX was sent to [facility] ,but they are not able to obtain Zepbound  at this time. Pt wants RX sent to her mail order pharmacy. Patient has not had Zepbound  since 04/12/2024.

## 2024-04-26 NOTE — Telephone Encounter (Signed)
 Rx sent to [facility]

## 2024-05-10 ENCOUNTER — Ambulatory Visit (INDEPENDENT_AMBULATORY_CARE_PROVIDER_SITE_OTHER): Admitting: Family Medicine

## 2024-08-25 ENCOUNTER — Inpatient Hospital Stay

## 2024-08-25 ENCOUNTER — Inpatient Hospital Stay: Admitting: Hematology and Oncology
